# Patient Record
Sex: Female | Born: 1955 | Race: Black or African American | Hispanic: No | Marital: Married | Smoking: Never smoker
Health system: Southern US, Community
[De-identification: ages and names within clinical notes are randomized; demographics above are authoritative.]

## PROBLEM LIST (undated history)

## (undated) DIAGNOSIS — E785 Hyperlipidemia, unspecified: Secondary | ICD-10-CM

## (undated) DIAGNOSIS — K2970 Gastritis, unspecified, without bleeding: Secondary | ICD-10-CM

## (undated) DIAGNOSIS — G4700 Insomnia, unspecified: Secondary | ICD-10-CM

## (undated) DIAGNOSIS — G4730 Sleep apnea, unspecified: Secondary | ICD-10-CM

## (undated) DIAGNOSIS — IMO0002 Reserved for concepts with insufficient information to code with codable children: Secondary | ICD-10-CM

## (undated) DIAGNOSIS — F0781 Postconcussional syndrome: Secondary | ICD-10-CM

## (undated) DIAGNOSIS — R011 Cardiac murmur, unspecified: Secondary | ICD-10-CM

## (undated) DIAGNOSIS — M259 Joint disorder, unspecified: Secondary | ICD-10-CM

## (undated) DIAGNOSIS — M539 Dorsopathy, unspecified: Secondary | ICD-10-CM

## (undated) DIAGNOSIS — R252 Cramp and spasm: Secondary | ICD-10-CM

## (undated) DIAGNOSIS — F32A Depression, unspecified: Secondary | ICD-10-CM

## (undated) DIAGNOSIS — R0602 Shortness of breath: Secondary | ICD-10-CM

## (undated) DIAGNOSIS — R569 Unspecified convulsions: Secondary | ICD-10-CM

## (undated) DIAGNOSIS — I639 Cerebral infarction, unspecified: Secondary | ICD-10-CM

## (undated) DIAGNOSIS — I10 Essential (primary) hypertension: Secondary | ICD-10-CM

## (undated) DIAGNOSIS — M51379 Other intervertebral disc degeneration, lumbosacral region without mention of lumbar back pain or lower extremity pain: Secondary | ICD-10-CM

## (undated) DIAGNOSIS — R109 Unspecified abdominal pain: Secondary | ICD-10-CM

## (undated) DIAGNOSIS — K219 Gastro-esophageal reflux disease without esophagitis: Secondary | ICD-10-CM

## (undated) DIAGNOSIS — Z8719 Personal history of other diseases of the digestive system: Secondary | ICD-10-CM

## (undated) DIAGNOSIS — M5137 Other intervertebral disc degeneration, lumbosacral region: Secondary | ICD-10-CM

## (undated) DIAGNOSIS — N390 Urinary tract infection, site not specified: Secondary | ICD-10-CM

## (undated) HISTORY — DX: Essential (primary) hypertension: I10

## (undated) HISTORY — DX: Unspecified abdominal pain: R10.9

## (undated) HISTORY — DX: Hyperlipidemia, unspecified: E78.5

## (undated) HISTORY — PX: ABDOMINAL HYSTERECTOMY: SHX81

## (undated) HISTORY — PX: BACK SURGERY: SHX140

## (undated) HISTORY — DX: Insomnia, unspecified: G47.00

## (undated) HISTORY — DX: Dorsopathy, unspecified: M53.9

## (undated) HISTORY — DX: Postconcussional syndrome: F07.81

## (undated) HISTORY — DX: Reserved for concepts with insufficient information to code with codable children: IMO0002

## (undated) HISTORY — DX: Cerebral infarction, unspecified: I63.9

## (undated) HISTORY — DX: Cramp and spasm: R25.2

## (undated) HISTORY — DX: Joint disorder, unspecified: M25.9

## (undated) HISTORY — PX: COLONOSCOPY: SHX174

## (undated) HISTORY — DX: Depression, unspecified: F32.A

## (undated) HISTORY — DX: Gastritis, unspecified, without bleeding: K29.70

## (undated) HISTORY — PX: ARTHROGRAM KNEE: SUR67

## (undated) HISTORY — PX: HEMORRHOID SURGERY: SHX153

---

## 1998-03-19 ENCOUNTER — Ambulatory Visit (HOSPITAL_COMMUNITY): Admission: RE | Admit: 1998-03-19 | Discharge: 1998-03-19 | Payer: Self-pay | Admitting: Family Medicine

## 1998-03-19 ENCOUNTER — Encounter: Payer: Self-pay | Admitting: Family Medicine

## 1998-10-16 ENCOUNTER — Encounter: Payer: Self-pay | Admitting: Family Medicine

## 1998-10-16 ENCOUNTER — Ambulatory Visit (HOSPITAL_COMMUNITY): Admission: RE | Admit: 1998-10-16 | Discharge: 1998-10-16 | Payer: Self-pay | Admitting: Family Medicine

## 1998-11-01 ENCOUNTER — Ambulatory Visit (HOSPITAL_COMMUNITY): Admission: RE | Admit: 1998-11-01 | Discharge: 1998-11-01 | Payer: Self-pay | Admitting: Family Medicine

## 1998-11-01 ENCOUNTER — Encounter: Payer: Self-pay | Admitting: Family Medicine

## 1998-11-15 ENCOUNTER — Encounter: Payer: Self-pay | Admitting: Family Medicine

## 1998-11-15 ENCOUNTER — Ambulatory Visit (HOSPITAL_COMMUNITY): Admission: RE | Admit: 1998-11-15 | Discharge: 1998-11-15 | Payer: Self-pay | Admitting: Family Medicine

## 1999-01-04 ENCOUNTER — Encounter: Payer: Self-pay | Admitting: Family Medicine

## 1999-01-04 ENCOUNTER — Ambulatory Visit (HOSPITAL_COMMUNITY): Admission: RE | Admit: 1999-01-04 | Discharge: 1999-01-06 | Payer: Self-pay | Admitting: Family Medicine

## 1999-05-04 ENCOUNTER — Encounter: Payer: Self-pay | Admitting: Obstetrics & Gynecology

## 1999-05-08 ENCOUNTER — Inpatient Hospital Stay (HOSPITAL_COMMUNITY): Admission: RE | Admit: 1999-05-08 | Discharge: 1999-05-10 | Payer: Self-pay | Admitting: Obstetrics & Gynecology

## 1999-05-08 ENCOUNTER — Encounter (INDEPENDENT_AMBULATORY_CARE_PROVIDER_SITE_OTHER): Payer: Self-pay | Admitting: Specialist

## 1999-06-19 ENCOUNTER — Emergency Department (HOSPITAL_COMMUNITY): Admission: EM | Admit: 1999-06-19 | Discharge: 1999-06-19 | Payer: Self-pay

## 1999-06-27 ENCOUNTER — Encounter: Admission: RE | Admit: 1999-06-27 | Discharge: 1999-06-27 | Payer: Self-pay | Admitting: Gastroenterology

## 1999-06-27 ENCOUNTER — Encounter: Payer: Self-pay | Admitting: Gastroenterology

## 1999-07-12 ENCOUNTER — Ambulatory Visit (HOSPITAL_COMMUNITY): Admission: RE | Admit: 1999-07-12 | Discharge: 1999-07-12 | Payer: Self-pay | Admitting: Gastroenterology

## 1999-12-08 ENCOUNTER — Ambulatory Visit (HOSPITAL_COMMUNITY): Admission: RE | Admit: 1999-12-08 | Discharge: 1999-12-08 | Payer: Self-pay | Admitting: Obstetrics and Gynecology

## 2002-12-24 ENCOUNTER — Ambulatory Visit (HOSPITAL_BASED_OUTPATIENT_CLINIC_OR_DEPARTMENT_OTHER): Admission: RE | Admit: 2002-12-24 | Discharge: 2002-12-24 | Payer: Self-pay | Admitting: Neurology

## 2003-01-21 ENCOUNTER — Encounter: Admission: RE | Admit: 2003-01-21 | Discharge: 2003-01-21 | Payer: Self-pay | Admitting: Family Medicine

## 2003-10-06 ENCOUNTER — Encounter: Admission: RE | Admit: 2003-10-06 | Discharge: 2003-10-06 | Payer: Self-pay | Admitting: Gastroenterology

## 2003-11-23 ENCOUNTER — Encounter: Admission: RE | Admit: 2003-11-23 | Discharge: 2003-11-23 | Payer: Self-pay | Admitting: Neurology

## 2003-12-09 ENCOUNTER — Ambulatory Visit (HOSPITAL_COMMUNITY): Admission: RE | Admit: 2003-12-09 | Discharge: 2003-12-09 | Payer: Self-pay | Admitting: Neurology

## 2004-04-05 ENCOUNTER — Ambulatory Visit (HOSPITAL_COMMUNITY): Admission: RE | Admit: 2004-04-05 | Discharge: 2004-04-05 | Payer: Self-pay | Admitting: Neurology

## 2005-02-07 ENCOUNTER — Encounter: Admission: RE | Admit: 2005-02-07 | Discharge: 2005-02-07 | Payer: Self-pay | Admitting: Family Medicine

## 2005-03-22 ENCOUNTER — Encounter: Admission: RE | Admit: 2005-03-22 | Discharge: 2005-03-22 | Payer: Self-pay | Admitting: Family Medicine

## 2006-03-06 ENCOUNTER — Encounter: Admission: RE | Admit: 2006-03-06 | Discharge: 2006-03-06 | Payer: Self-pay | Admitting: Cardiology

## 2006-04-25 IMAGING — CT CT PARANASAL SINUSES LIMITED
1 series · 16 of 28 positions shown, 20 images · non-contrast
Comparison: none

CLINICAL DATA: Headaches and sinusitis.  
 LIMITED CT OF PARANASAL SINUSES:
TECHNIQUE: Limited coronal CT images were obtained through the paranasal sinuses without intravenous contrast.

[Series 2: — · axial · 0.33mm/px · z∈[+9,+110]mm · 16 of 28 slices shown, 20 images]
[im 2/28  brain]
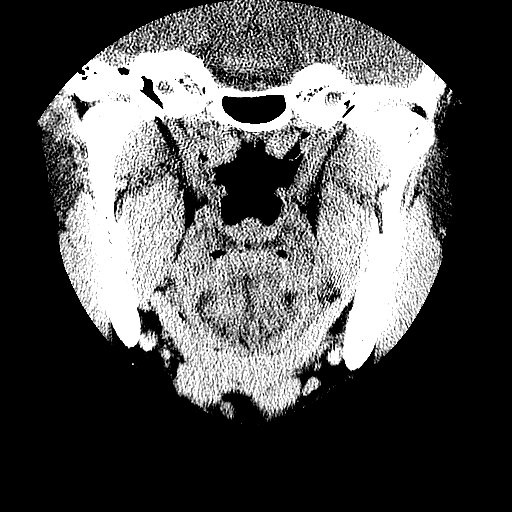
[im 2/28  bone]
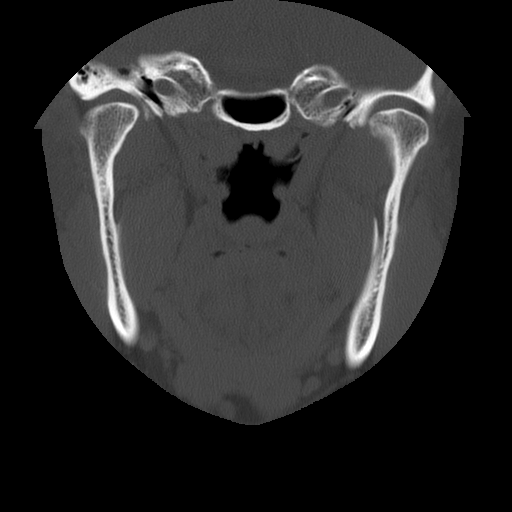
[im 4/28  bone]
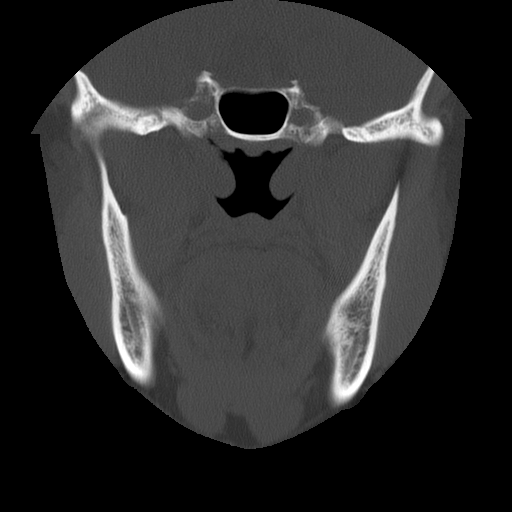
[im 6/28  bone]
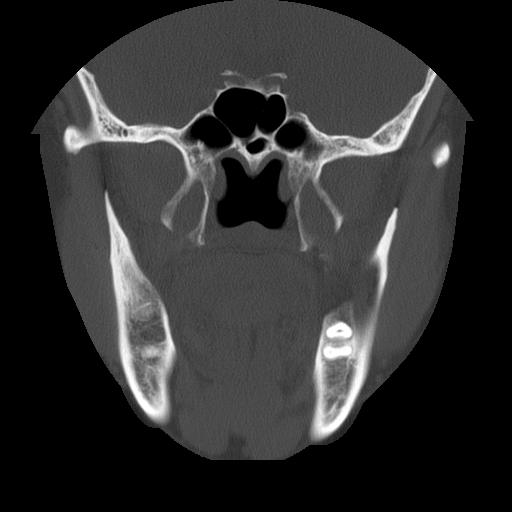
[im 7/28  bone]
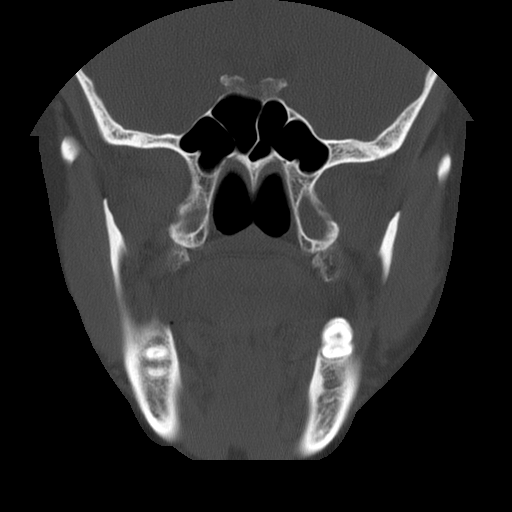
[im 9/28  brain]
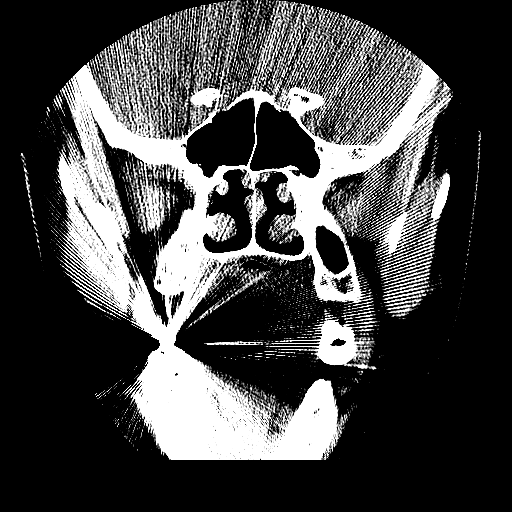
[im 9/28  bone]
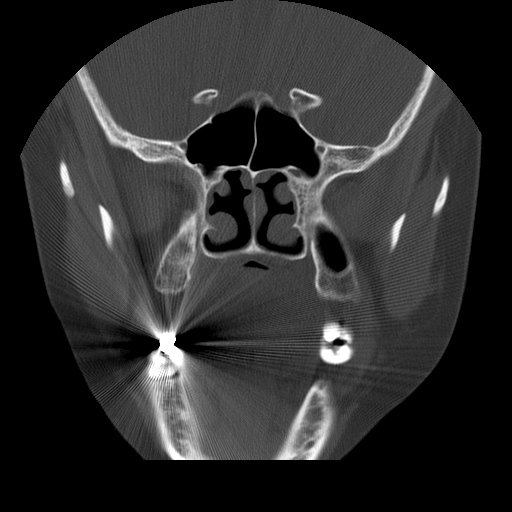
[im 10/28  bone]
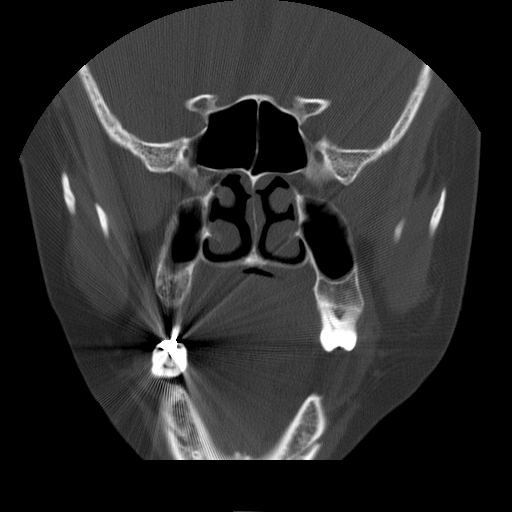
[im 12/28  bone]
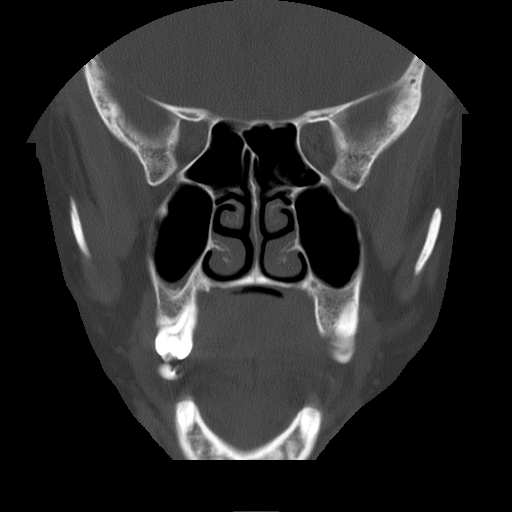
[im 14/28  bone]
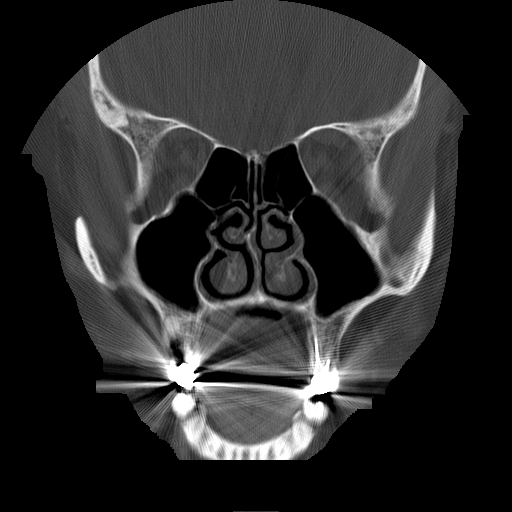
[im 15/28  brain]
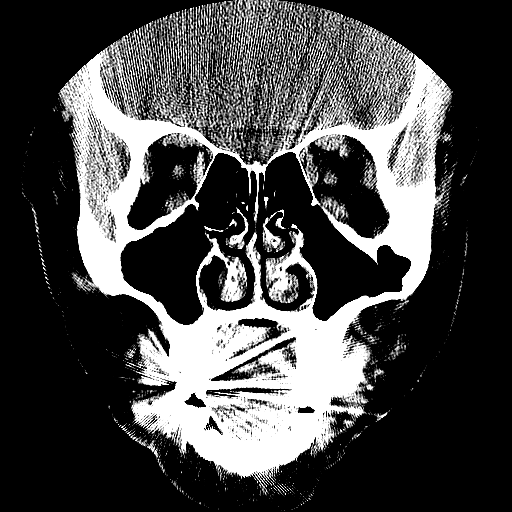
[im 15/28  bone]
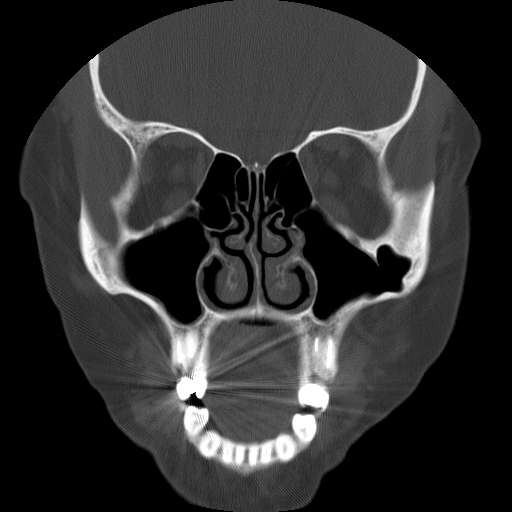
[im 17/28  bone]
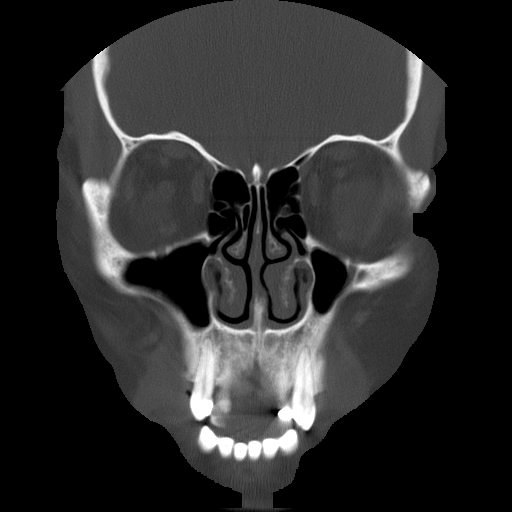
[im 19/28  bone]
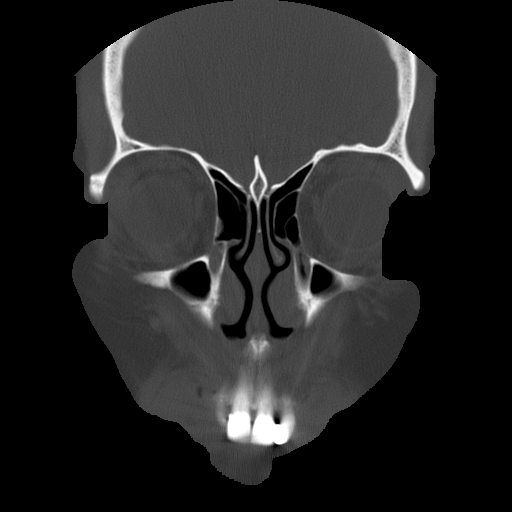
[im 20/28  bone]
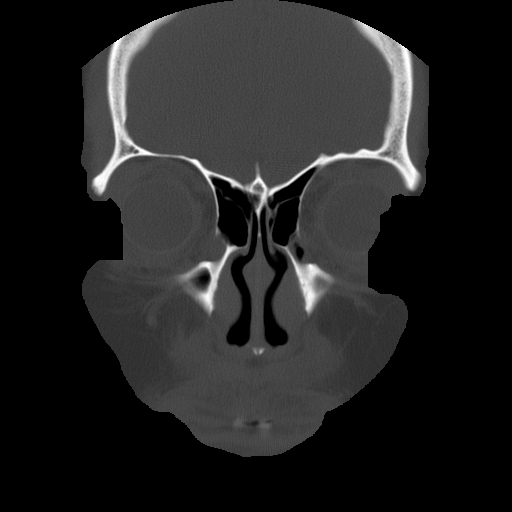
[im 22/28  brain]
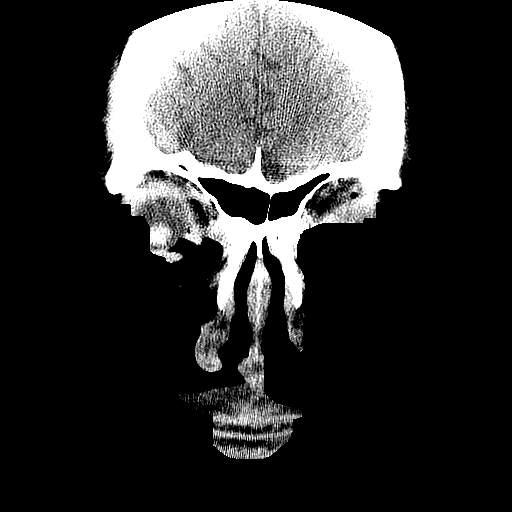
[im 22/28  bone]
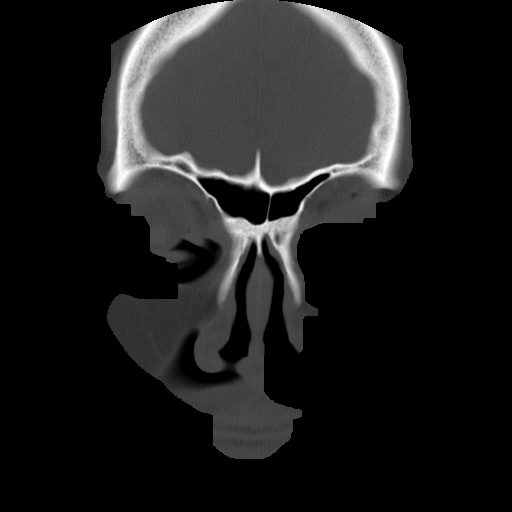
[im 23/28  bone]
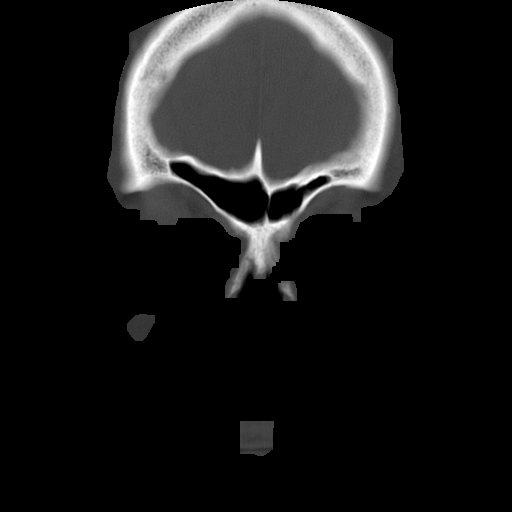
[im 25/28  bone]
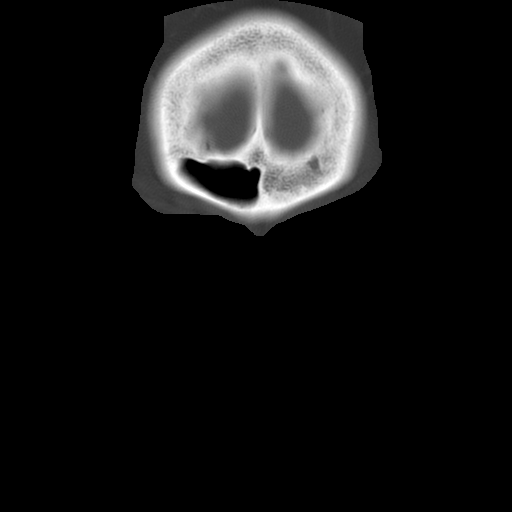
[im 27/28  bone]
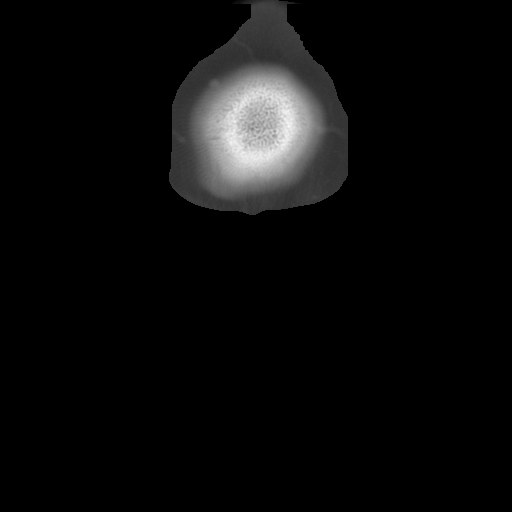

[16 of 28 positions shown; findings below may reference images not displayed]

FINDINGS: Mild mucosal thickening is noted in the left maxillary sinus.  The remaining paranasal sinuses are clear.  
 Temporomandibular joints are located.
IMPRESSION: Mild mucosal thickening in the left maxillary sinus consistent with chronic sinusitis.

## 2006-10-23 ENCOUNTER — Encounter: Admission: RE | Admit: 2006-10-23 | Discharge: 2006-10-23 | Payer: Self-pay | Admitting: Family Medicine

## 2007-04-19 ENCOUNTER — Emergency Department (HOSPITAL_COMMUNITY): Admission: EM | Admit: 2007-04-19 | Discharge: 2007-04-20 | Payer: Self-pay | Admitting: Emergency Medicine

## 2007-11-20 ENCOUNTER — Encounter: Admission: RE | Admit: 2007-11-20 | Discharge: 2007-11-20 | Payer: Self-pay | Admitting: Family Medicine

## 2008-12-21 ENCOUNTER — Encounter: Admission: RE | Admit: 2008-12-21 | Discharge: 2008-12-21 | Payer: Self-pay | Admitting: Family Medicine

## 2009-03-30 ENCOUNTER — Encounter: Admission: RE | Admit: 2009-03-30 | Discharge: 2009-03-30 | Payer: Self-pay | Admitting: Orthopedic Surgery

## 2009-07-29 ENCOUNTER — Encounter (INDEPENDENT_AMBULATORY_CARE_PROVIDER_SITE_OTHER): Payer: Self-pay | Admitting: Internal Medicine

## 2009-07-29 ENCOUNTER — Ambulatory Visit: Payer: Self-pay | Admitting: Cardiovascular Disease

## 2009-07-29 ENCOUNTER — Ambulatory Visit: Payer: Self-pay | Admitting: Vascular Surgery

## 2009-07-29 ENCOUNTER — Observation Stay (HOSPITAL_COMMUNITY): Admission: EM | Admit: 2009-07-29 | Discharge: 2009-07-30 | Payer: Self-pay | Admitting: Emergency Medicine

## 2010-01-21 ENCOUNTER — Encounter: Payer: Self-pay | Admitting: Family Medicine

## 2010-01-22 ENCOUNTER — Encounter: Payer: Self-pay | Admitting: Cardiology

## 2010-01-22 ENCOUNTER — Encounter: Payer: Self-pay | Admitting: Neurology

## 2010-02-24 ENCOUNTER — Other Ambulatory Visit: Payer: Self-pay | Admitting: Gastroenterology

## 2010-02-27 ENCOUNTER — Ambulatory Visit
Admission: RE | Admit: 2010-02-27 | Discharge: 2010-02-27 | Disposition: A | Discharge status: Home / Self Care | Payer: Medicare Other | Source: Ambulatory Visit | Attending: Gastroenterology | Admitting: Gastroenterology

## 2010-02-28 ENCOUNTER — Other Ambulatory Visit (HOSPITAL_COMMUNITY): Payer: Self-pay | Admitting: Gastroenterology

## 2010-02-28 DIAGNOSIS — R1013 Epigastric pain: Secondary | ICD-10-CM

## 2010-03-10 ENCOUNTER — Ambulatory Visit (HOSPITAL_COMMUNITY)
Admission: RE | Admit: 2010-03-10 | Discharge: 2010-03-10 | Disposition: A | Discharge status: Home / Self Care | Payer: Medicare Other | Source: Ambulatory Visit | Attending: Gastroenterology | Admitting: Gastroenterology

## 2010-03-10 DIAGNOSIS — R1013 Epigastric pain: Secondary | ICD-10-CM | POA: Insufficient documentation

## 2010-03-10 MED ORDER — TECHNETIUM TC 99M MEBROFENIN IV KIT
5.0000 | PACK | Freq: Once | INTRAVENOUS | Status: AC | PRN
  Administered 2010-03-10: 5 via INTRAVENOUS

## 2010-03-18 LAB — COMPREHENSIVE METABOLIC PANEL
AST: 19 U/L (ref 0–37)
Albumin: 3.7 g/dL (ref 3.5–5.2)
Alkaline Phosphatase: 64 U/L (ref 39–117)
BUN: 13 mg/dL (ref 6–23)
CO2: 27 mEq/L (ref 19–32)
Chloride: 107 mEq/L (ref 96–112)
Potassium: 4.4 mEq/L (ref 3.5–5.1)
Total Bilirubin: 0.7 mg/dL (ref 0.3–1.2)

## 2010-03-18 LAB — CK TOTAL AND CKMB (NOT AT ARMC)
CK, MB: 1.6 ng/mL (ref 0.3–4.0)
Relative Index: 1.3 (ref 0.0–2.5)
Total CK: 125 U/L (ref 7–177)

## 2010-03-18 LAB — CBC
HCT: 40 % (ref 36.0–46.0)
Hemoglobin: 12.4 g/dL (ref 12.0–15.0)
Hemoglobin: 13.6 g/dL (ref 12.0–15.0)
MCH: 32 pg (ref 26.0–34.0)
MCH: 32 pg (ref 26.0–34.0)
MCHC: 34.1 g/dL (ref 30.0–36.0)
MCV: 93.6 fL (ref 78.0–100.0)
MCV: 93.9 fL (ref 78.0–100.0)
Platelets: 191 10*3/uL (ref 150–400)
RBC: 3.89 MIL/uL (ref 3.87–5.11)
RBC: 4.26 MIL/uL (ref 3.87–5.11)
RDW: 14.6 % (ref 11.5–15.5)
WBC: 3.7 10*3/uL — ABNORMAL LOW (ref 4.0–10.5)

## 2010-03-18 LAB — URINALYSIS, ROUTINE W REFLEX MICROSCOPIC
Bilirubin Urine: NEGATIVE
Hgb urine dipstick: NEGATIVE
Specific Gravity, Urine: 1.009 (ref 1.005–1.030)
pH: 7.5 (ref 5.0–8.0)

## 2010-03-18 LAB — BASIC METABOLIC PANEL
CO2: 29 mEq/L (ref 19–32)
Chloride: 103 mEq/L (ref 96–112)
GFR calc Af Amer: >60 mL/min (ref >60)
Sodium: 139 mEq/L (ref 135–145)

## 2010-03-18 LAB — DIFFERENTIAL
Basophils Absolute: 0 10*3/uL (ref 0.0–0.1)
Basophils Relative: 1 % (ref 0–1)
Eosinophils Relative: 3 % (ref 0–5)
Monocytes Absolute: 0.4 10*3/uL (ref 0.1–1.0)
Neutro Abs: 1.5 10*3/uL — ABNORMAL LOW (ref 1.7–7.7)

## 2010-03-18 LAB — GLUCOSE, CAPILLARY: Glucose-Capillary: 101 mg/dL — ABNORMAL HIGH (ref 70–99)

## 2010-04-05 ENCOUNTER — Other Ambulatory Visit (HOSPITAL_COMMUNITY): Payer: Self-pay | Admitting: General Surgery

## 2010-04-05 DIAGNOSIS — R1013 Epigastric pain: Secondary | ICD-10-CM

## 2010-04-13 ENCOUNTER — Encounter (HOSPITAL_COMMUNITY)
Admission: RE | Admit: 2010-04-13 | Discharge: 2010-04-13 | Disposition: A | Discharge status: Home / Self Care | Payer: Medicare Other | Source: Ambulatory Visit | Attending: General Surgery | Admitting: General Surgery

## 2010-04-13 DIAGNOSIS — R110 Nausea: Secondary | ICD-10-CM | POA: Insufficient documentation

## 2010-04-13 DIAGNOSIS — R142 Eructation: Secondary | ICD-10-CM | POA: Insufficient documentation

## 2010-04-13 DIAGNOSIS — R1013 Epigastric pain: Secondary | ICD-10-CM

## 2010-04-13 DIAGNOSIS — R143 Flatulence: Secondary | ICD-10-CM | POA: Insufficient documentation

## 2010-04-13 DIAGNOSIS — R109 Unspecified abdominal pain: Secondary | ICD-10-CM | POA: Insufficient documentation

## 2010-04-13 DIAGNOSIS — R141 Gas pain: Secondary | ICD-10-CM | POA: Insufficient documentation

## 2010-04-13 MED ORDER — TECHNETIUM TC 99M SULFUR COLLOID
2.0000 | Freq: Once | INTRAVENOUS | Status: AC | PRN
  Administered 2010-04-13: 2 via INTRAVENOUS

## 2010-04-25 ENCOUNTER — Other Ambulatory Visit: Payer: Self-pay | Admitting: General Surgery

## 2010-04-25 DIAGNOSIS — R1013 Epigastric pain: Secondary | ICD-10-CM

## 2010-04-25 DIAGNOSIS — R140 Abdominal distension (gaseous): Secondary | ICD-10-CM

## 2010-04-28 ENCOUNTER — Ambulatory Visit
Admission: RE | Admit: 2010-04-28 | Discharge: 2010-04-28 | Disposition: A | Discharge status: Home / Self Care | Payer: Medicare Other | Source: Ambulatory Visit | Attending: General Surgery | Admitting: General Surgery

## 2010-04-28 DIAGNOSIS — R1013 Epigastric pain: Secondary | ICD-10-CM

## 2010-04-28 DIAGNOSIS — R140 Abdominal distension (gaseous): Secondary | ICD-10-CM

## 2010-04-28 MED ORDER — IOHEXOL 300 MG/ML  SOLN
125.0000 mL | Freq: Once | INTRAMUSCULAR | Status: AC | PRN
  Administered 2010-04-28: 125 mL via INTRAVENOUS

## 2010-05-08 ENCOUNTER — Encounter (INDEPENDENT_AMBULATORY_CARE_PROVIDER_SITE_OTHER): Payer: Self-pay | Admitting: General Surgery

## 2010-05-19 NOTE — Op Note (Signed)
[facility]  Patient:    Savannah Taylor, Savannah Taylor                  MRN: [id number] Adm. Date:  [id number] Attending:  Genia Del                           Operative Report  DATE OF BIRTH:  26-Dec-1955.  PREOPERATIVE DIAGNOSIS:  Menorrhagia with uterine myomas.  POSTOPERATIVE DIAGNOSIS:  Menorrhagia with uterine myomas.  INTERVENTION:  Laparoscopically assisted vaginal hysterectomy.  SURGEON:  Genia Del, M.D.  ASSISTANT:  Pershing Cox, M.D.  ANESTHESIOLOGIST:  Lucille Passy, M.D.  DESCRIPTION OF PROCEDURE:  Under general anesthesia with endotracheal intubation, the patient is in lithotomy position.  She is prepped with Betadine on the abdominal, suprapubic, vulvar and vaginal areas.  The bladder catheter is introduced and the patient is then draped as usual.  The vaginal exam reveals a mobile uterus that corresponds to about 8-weeks gestation.  It is mobile.  No adnexal mass.  The speculum is introduced and the uterus is cannulated with the single-tooth cannula.  Abdominally, an infraumbilical incision is done over 10 mm with a scalpel.  The Veress needle is introduced. The security tests are done and a pneumoperitoneum is created with 3 L of CO2. Once finished, the Veress needle is removed.  The trocar with the laparoscope is inserted.  The abdominal and pelvic cavities are observed; no gross pathology in the abdominal cavity.  The pelvic cavity reveals two normal ovaries in appearance and size.  The tubes are post tubal sterilization and the uterus is increased in volume with multiple myomas; the biggest is fundal, measuring probably around 3.0 to 3.5 cm.  Two contralateral incisions are made, one at the right and the other one on the left iliac fossae, over 5 mm with a scalpel.  The 5-mm trocars are introduced and the instruments are placed in.  We use the tripolar to clamp, cauterize and cut successively, starting with the  round ligament on the left side, then the uteroovarian and going down along the broad ligament on the left uterine aspect; we proceed exactly the same on the right side.  We then verify the location of the ureters on each side, which are seen in normal anatomic position and away from the surgical field.  The anterior peritoneum is then opened and the bladder is retracted downwards.  Some adhesions are present at that level because of the previous three C-sections.  We then confirm that hemostasis is adequate and remove the instruments but the leave the trocars and continue vaginally.  The patients legs are repositioned and the valve is introduced.  The cervix is grasped with two tenacula.  The cervix is infiltrated all around with saline and then a scalpel is used to do a circular incision at the junction between the cervix and the vagina.  We then open the anterior peritoneum and using a retractor, move the bladder away from the surgical site.  We then successively clamp, cut and suture the cardinal ligaments, then the uterosacral ligaments and then the uterine arteries on each side.  We then complete the hysterectomy by clamping some of the broad ligaments peritoneum that was left on each side; we clamp, cut and suture.  Hemostasis is completed on the right side with two sutures close to the angle of the posterior vaginal mucosa edge.  We then do two McCall sutures with Vicryl  0 and reapproximate the two uterosacral ligaments in the midline.  We do a suspensory suture on each side, including the anterior vaginal wall, the peritoneum anteriorly, the uterosacral ligaments and the peritoneum posteriorly, finishing with the posterior vaginal wall.  We then do a circular baseball stitch that is a running-______ suture with Vicryl 0 all around the edge of the vaginal mucosa.  We then closed the vagina via a running-locked suture with Vicryl 0 and we finish by attaching the suspensory sutures on  each side.  We then packed the vagina with an iodine packing, one inch; hemostasis was adequate.  We then notice a small vulvar tear on the left side, which is closed with Vicryl 4-0 and then infiltrated with Marcaine.  We go back to the abdominal tying to verify hemostasis. Pneumoperitoneum is re-created and the camera as well as the Nezhat are introduced with the probe.  Hemostasis is completed at the angle of the vagina on the left side with the tripolar.  We then irrigate and suction the pelvic cavity and hemostasis is good.  We remove all the instruments, let the CO2 evacuate and we then close the aponeurosis on the infraumbilical incision with Vicryl 0.  We then close the skin on each incision with Vicryl 4-0.  Marcaine plain 0.25% is injected at the level of each incision.  The estimated blood loss was 1.1 L.  The hematocrit at the end of the surgery was 0.26.  No complication occurred except the small vulvar tear that was repaired. DD:  05/08/99 TD:  05/10/99 Job: [id number]

## 2010-05-19 NOTE — Procedures (Signed)
[place]  Patient:    Savannah Taylor, Savannah Taylor                     MRN: [id number] Proc. Date: 07/12/99 Adm. Date:  [id number] Attending:  Charna Elizabeth CC:         Genia Del, M.D.                           Procedure Report  DATE OF BIRTH:  [birth year]  REFERRING PHYSICIAN:  Genia Del, M.D.  PROCEDURE PERFORMED:  Colonoscopy.  ENDOSCOPIST:  Anselmo Rod, M.D.  INSTRUMENT USED:  Olympus video colonoscope.  INDICATIONS FOR PROCEDURE:  Abdominal pain with trace guaiac positive stools in a 55 year old black female, rule out colonic masses, polyps, hemorrhoids, etc.  The patient is status post hysterectomy in the recent past.  PREPROCEDURE PREPARATION:  Informed consent was procured from the patient. The patient was fasted for eight hours prior to the procedure and prepped with a bottle of magnesium citrate and a gallon of NuLytely the night prior to the procedure.  PREPROCEDURE PHYSICAL:  The patient had stable vital signs.  Neck supple. Chest clear to auscultation.  S1, S2 regular.  Abdomen soft with normal abdominal bowel sounds.  DESCRIPTION OF PROCEDURE:  The patient was placed in the left lateral decubitus position and sedated with an additional 25 mcg of fentanyl and 2.5 mg of Versed intravenously.  Once the patient was adequately sedated and maintained on low-flow oxygen and continuous cardiac monitoring, the Olympus video colonoscope was advanced from the rectum to the cecum without difficulty.  Except for small internal hemorrhoids no other abnormalities were seen.  The patient tolerated the procedure well without complication.  The procedure was complete up to the cecum.  The ileocecal valve and the appendicular orifice were clearly visualized.  IMPRESSION:  Normal colonoscopy except for small nonbleeding internal hemorrhoids.  RECOMMENDATIONS: 1. The patient has been advised to increase the fluid and fiber in her  diet. 2. She is advised to follow up with Dr. Seymour Bars for further evaluation of her    discomfort after hysterectomy.  She is to see me on a p.r.n. basis for GI    complaints.DD:  07/12/99 TD:  07/12/99 Job: [id number]

## 2010-05-19 NOTE — Procedures (Signed)
[place]  Patient:    Savannah Taylor, Savannah Taylor                     MRN: [id number] Proc. Date: 07/12/99 Adm. Date:  [id number] Attending:  Charna Elizabeth CC:         Genia Del, M.D.                           Procedure Report  DATE OF BIRTH:  26-Sep-1955  REFERRING PHYSICIAN:  Genia Del, M.D.  PROCEDURE PERFORMED:  Esophagogastroduodenoscopy.  ENDOSCOPIST:  Anselmo Rod, M.D.  INSTRUMENT USED:  Olympus video panendoscope.  INDICATIONS FOR PROCEDURE:  Epigastric pain and periumbilical discomfort with trace guaiac positive stool in a 55 year old black female who has undergone hysterectomy in the recent past.  Rule out peptic ulcer disease, esophagitis, gastritis, etc.  PREPROCEDURE PREPARATION:  Informed consent was procured from the patient. The patient was fasted for eight hours prior to the procedure.  PREPROCEDURE PHYSICAL:  The patient had stable vital signs.  Neck supple. Chest clear to auscultation.  S1, S2 regular.  Abdomen soft with normal abdominal bowel sounds.  DESCRIPTION OF PROCEDURE:  The patient was placed in left lateral decubitus position and sedated with 75 mcg of fentanyl and 7.5 mg of Versed intravenously.  Once the patient was adequately sedated and maintained on low-flow oxygen and continuous cardiac monitoring, the Olympus video panendoscope was advanced through the mouthpiece, over the tongue, into the esophagus under direct vision.  The entire esophagus appeared normal without evidence of ring, stricture, masses lesions or esophagitis.  The scope was then advanced into the stomach.  There was a small hiatal hernia seen on high retroflexion in the cardia.  No other abnormalities were present on the gastric mucosa except for mild antral gastritis.  The duodenal bulb and the small bowel distal to the bulb appeared normal.  There was no outlet obstruction.  The patient tolerated the procedure well without  complications.  IMPRESSION: 1. Small hiatal hernia. 2. Mild antral gastritis.  RECOMMENDATION: 1. Proceed with colonoscopy at this time. 2. Avoid nonsteroidals for now. 3. Follow antireflux measures. DD:  07/12/99 TD:  07/12/99 Job: [id number]

## 2010-05-19 NOTE — Discharge Summary (Signed)
[facility]  Patient:    Savannah Taylor, Savannah Taylor                  MRN: [id number] Adm. Date:  [id number] Disc. Date: [id number] Attending:  Genia Del                           Discharge Summary  ADMISSION DIAGNOSIS:  Symptomatic uterine myomas with menorrhagia.  PREOPERATIVE DIAGNOSIS:  Symptomatic uterine myomas with menorrhagia.  POSTOPERATIVE DIAGNOSIS:  Symptomatic uterine myomas with menorrhagia.  SURGERY:  May 08, 1999, laparoscopically assisted vaginal hysterectomy with preservation of adnexa.  POSTOPERATIVE EVALUATION:  Unremarkable.  Cardiovascularly stable.  Afebrile. Hemoglobin postop 8.4, hematocrit 26.  DISPOSITION:  Discharged on postop day #2.  Postop advise is given.  DISCHARGE MEDICATIONS: 1. Tylox p.r.n. prescribed. 2. Iron sulfate t.i.d. recommended.  FOLLOWUP:  Will follow up at [facility] in three weeks.  DISCHARGE DIAGNOSIS:  Symptomatic uterine myomas with menorrhagia.  COMPLICATIONS:  No operative complication except for postop anemia. DD:  05/26/99 TD:  05/29/99 Job: [id number]

## 2010-05-19 NOTE — Procedures (Signed)
HISTORY:  This patient is a 55 year old patient with a history of migraine,  generalized seizure in the last 6 weeks prior to this evaluation. The  patient is being evaluated for possibly epilepsy. This is a  sleep-deprived  EEG recording. No skull defects were noted.   MEDICATIONS:  1.  Topamax.  2.  Stadol.  3.  Nasal spray.  4.  Imitrex.  5.  Aspirin.  6.  Verapamil.   EEG CLASSIFICATION:  Dysrhythmia, grade 2, bifrontal temporal.   DESCRIPTION:  According to the background rhythms, this recording consists  of a fairly well modulated medium amplitude alpha rhythm of 8 to 9 hertz and  is reactive to eye opening and closure. As the record progresses, the  patient is initially in a waking state but at times seems to enter the  drowsy state with a drop out of the background rhythm and activities and  onset of 7 hertz beta frequency slowing seen. The patient never entered  stage 2 sleep at any time during the recording. At times, during the  recording, there appears to be evidence of sharp complexes emanating from  the anterior frontal and temporal regions in a bilateral fashion. This is  also seen during hyperventilation. Photic stimulation does result in a  bilateral and symmetric photic driving response. At no time during the  recording are there overt spike wave discharges seen. EKG monitor showed no  evidence of cardiac rhythm abnormalities  with a heart rate of 72.   IMPRESSION:  This is an abnormal EEG recording due to sharp wave activity  emanating from the anterior frontal temporal regions. Such a recording  suggests lowered seizure threshold although no epileptiform/electrographic  seizures were seen during this recording. The patient never entered sleeping  stage during this sleep deprived recording.      DGL:OVFI  D:  12/09/2003 20:53:11  T:  12/10/2003 13:30:10  Job #:  [id number]

## 2010-05-19 NOTE — H&P (Signed)
[facility]  Patient:    Savannah Taylor, Savannah Taylor                       MRN: [id number] Adm. Date:  12/08/99 Attending:  Janine Limbo, M.D. CC:         Anselmo Rod, M.D.   History and Physical  HISTORY OF PRESENT ILLNESS:     Ms. Mix is a 55 year old para 3-0-2-3, who presents for a diagnostic laparoscopy because of abdominal pain.  The patient reports that the pain has been around her umbilicus and then travels to her lower abdomen on both the right and left side.  The patient had a laparoscopically-assisted vaginal hysterectomy in May 2001 because of menorrhagia and fibroids by another physician.  She reports that her pain has become increasingly severe since the time of her surgery.  It is worse now than prior to her surgery.  She denies fever and chills.  The patient was evaluated by her family physician, and she was found to have a normal upper GI study, normal colonoscopy, normal pelvic ultrasound and normal CT scan.  The patient has had a prior tubal ligation and a prior cesarean delivery.  She denies a past history of sexually transmitted infections.  Her last Pap smear was in July 2001, and it was said to be within normal limits.  PAST OBSTETRICAL HISTORY:       The patient has had three full-term deliveries.  She had one vaginal delivery and two cesarean sections.  She has had two miscarriages.  PAST MEDICAL HISTORY:           The patient has a history of asthma, hypertension, blood transfusions, migraine headaches, and intestinal problems. The patient does suffer from depression and anxiety.  CURRENT MEDICATIONS:            Cardizem, Celexa, Wellbutrin, and Xanax.  DRUG ALLERGIES:                 Naprosyn.  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  She denies cigarette use, alcohol use and recreational drug use.  REVIEW OF SYSTEMS:              The  patient wears glasses.  She exercises occasionally.  She occasionally does her own self breast exams.  Her diet is regular.  FAMILY HISTORY:                 The patients father had lung cancer.  PHYSICAL EXAMINATION:  VITAL SIGNS:                    Weight 203 pounds, height 5 feet 5 inches.  HEENT:                          Within normal limits.  CHEST:                          Clear.  HEART:                          Regular rate and rhythm.  BREASTS:  Without masses.  ABDOMEN:                        Nontender.  EXTREMITIES:                    Within normal limits.  NEUROLOGICAL:                   Exam is normal.  PELVIC:                         External genitalia is normal.  The vagina is normal.  The cervix is absent.  The uterus is absent.  Adnexa:  No masses appreciated.  Rectovaginal exam confirms.  ASSESSMENT:                     Worsening abdominal pain.  PLAN:                           The patient will undergo an open diagnostic laparoscopy.  She understands the indications for her procedure, and she accepts the risks of, but not limited to, anesthetic complications, bleeding, infections, and possible damage to the surrounding organs.  She understands that no guarantees can be given concerning the total relief of her discomfort. DD:  12/05/99 TD:  12/05/99 Job: [id number]

## 2010-05-19 NOTE — H&P (Signed)
[facility]  Patient:    Savannah Taylor, Savannah Taylor                       MRN: [id number] Adm. Date:  05/08/99 Attending:  Genia Del, M.D.                         History and Physical  DATE OF BIRTH: [birth year]  REASON FOR ADMISSION: Menorrhagia resistant to medical treatment secondary to uterine myoma.  HISTORY OF PRESENT ILLNESS:  Savannah Taylor is a 55 year old black female, G5, P3, A2. The patient was referred to [place] on October 18, 1998. She was evaluated at that time by our nurse practitioner, Savannah Taylor. She was complaining of constant pelvic discomfort and menses every 19 to 28 days x 7 days with heavy flow. The menorrhagia were progressing x 6 to 8 months. It was also accompanied by dysmenorrhea and not relieved by ibuprofen. On exam, the uterus was enlarged, slightly tender and irregular. She had had an ultrasound which I dont find fibroids. She was then referred to me on October 24. At that time, the symptoms  were the same. The ultrasound in October 2000 showed a uterus of 9.1 x 6.8 x 7 m. The largest fibroid was 4.5 cm, the two ovaries were normal. Hemoglobin was 12.1 in September and then was 11.7 in October. A discussion was done on surgical versus medical approach and the decision was made to attempt a Depot Lupron at a maximum of six months and to repeat the ultrasound after the third dose. The patients myomas responded quite well to the Depro-Lupron. The patient had menopausal symptoms that were tolerable, so she had five doses of Depot Lupron. The ultrasound in March 2001 showed a uterus that was now 8.6 x 6.4 x 6.6 cm and the largest fibroid had a diameter of 3.3 cm. A decision was made at that time to proceed with LAVH and to preserve the ovaries.  PAST MEDICAL HISTORY: Positive for hypertension.  PAST SURGICAL HISTORY: Bilateral tubal ligation, cesarean section x 3 and endarterectomy in 1999. She is currently taking atenolol  for hypertension.  ALLERGIES:  No known drug allergies.  SOCIAL HISTORY: Nonsmoker and married.  REVIEW OF SYSTEMS:  Constitutinal negative. HEENT within normal limits. Cardiovascular negative. Respiratory negative. Gastrointestinal negative. Urologic negative. Neurologic negative. Dermatologic negative.  PHYSICAL EXAMINATION:  GENERAL: No apparent distress.  VITAL SIGNS: Blood pressure 130/90, pulse regular, respiratory rate 20, temperature normal.  HEENT: Within normal limits.  HEART: S1, S2 normal. No S3, S4, no murmur.  LUNGS:  Clear bilaterally, good air entry.  ABDOMEN:  Soft, nontender, no hepatosplenomegaly, no mass.  VAGINAL:  The uterus is anteverted, slightly irregular corresponding to about [redacted]  weeks gestation, mobile. No adnexal mass. The cervix is normal. The vagina and vulva are normal.  EXTREMITIES: Lower limbs normal, no edema, good pulses.  IMPRESSION: A 55 year old, G5, P3, A2 with menorrhagia secondary to symptomatic  multiple uterine myomas with positive response to Depot Lupron x 5 doses.  PLAN:  LAVH with preservation of ovaries. Surgery plus risks discussed with the  patient. Prep done. The surgery will be done on May 08, 1999. My assistant will be Dr. Cherly Hensen. DD:  05/08/99 TD:  05/08/99 Job: [id number]

## 2010-06-14 ENCOUNTER — Other Ambulatory Visit (HOSPITAL_COMMUNITY): Payer: Self-pay | Admitting: General Surgery

## 2010-06-14 DIAGNOSIS — K449 Diaphragmatic hernia without obstruction or gangrene: Secondary | ICD-10-CM

## 2010-06-19 ENCOUNTER — Ambulatory Visit (HOSPITAL_COMMUNITY)
Admission: RE | Admit: 2010-06-19 | Discharge: 2010-06-19 | Disposition: A | Discharge status: Home / Self Care | Payer: Medicare Other | Source: Ambulatory Visit | Attending: General Surgery | Admitting: General Surgery

## 2010-06-19 DIAGNOSIS — K449 Diaphragmatic hernia without obstruction or gangrene: Secondary | ICD-10-CM | POA: Insufficient documentation

## 2010-06-19 DIAGNOSIS — K458 Other specified abdominal hernia without obstruction or gangrene: Secondary | ICD-10-CM | POA: Insufficient documentation

## 2010-06-19 DIAGNOSIS — R1013 Epigastric pain: Secondary | ICD-10-CM | POA: Insufficient documentation

## 2010-06-19 DIAGNOSIS — R110 Nausea: Secondary | ICD-10-CM | POA: Insufficient documentation

## 2010-07-03 ENCOUNTER — Ambulatory Visit (HOSPITAL_COMMUNITY)
Admission: RE | Admit: 2010-07-03 | Discharge: 2010-07-03 | Disposition: A | Discharge status: Home / Self Care | Payer: Medicare Other | Source: Ambulatory Visit | Attending: Gastroenterology | Admitting: Gastroenterology

## 2010-07-03 DIAGNOSIS — Z5320 Procedure and treatment not carried out because of patient's decision for unspecified reasons: Secondary | ICD-10-CM | POA: Insufficient documentation

## 2010-07-03 DIAGNOSIS — E785 Hyperlipidemia, unspecified: Secondary | ICD-10-CM | POA: Insufficient documentation

## 2010-07-03 DIAGNOSIS — R109 Unspecified abdominal pain: Secondary | ICD-10-CM | POA: Insufficient documentation

## 2010-07-03 DIAGNOSIS — I10 Essential (primary) hypertension: Secondary | ICD-10-CM | POA: Insufficient documentation

## 2010-07-03 DIAGNOSIS — K219 Gastro-esophageal reflux disease without esophagitis: Secondary | ICD-10-CM | POA: Insufficient documentation

## 2011-07-02 ENCOUNTER — Encounter: Payer: Medicare Other | Attending: Physical Medicine & Rehabilitation

## 2011-07-02 ENCOUNTER — Ambulatory Visit: Payer: Medicare Other | Admitting: Physical Medicine & Rehabilitation

## 2011-08-02 ENCOUNTER — Other Ambulatory Visit: Payer: Self-pay

## 2011-08-02 DIAGNOSIS — R0600 Dyspnea, unspecified: Secondary | ICD-10-CM

## 2011-08-03 ENCOUNTER — Ambulatory Visit (HOSPITAL_COMMUNITY)
Admission: RE | Admit: 2011-08-03 | Discharge: 2011-08-03 | Disposition: A | Discharge status: Home / Self Care | Payer: Medicare Other | Source: Ambulatory Visit | Attending: Family Medicine | Admitting: Family Medicine

## 2011-08-03 DIAGNOSIS — R0609 Other forms of dyspnea: Secondary | ICD-10-CM | POA: Insufficient documentation

## 2011-08-03 DIAGNOSIS — R0989 Other specified symptoms and signs involving the circulatory and respiratory systems: Secondary | ICD-10-CM | POA: Insufficient documentation

## 2011-08-03 MED ORDER — ALBUTEROL SULFATE (5 MG/ML) 0.5% IN NEBU
2.5000 mg | INHALATION_SOLUTION | Freq: Once | RESPIRATORY_TRACT | Status: AC
  Administered 2011-08-03: 2.5 mg via RESPIRATORY_TRACT

## 2011-09-04 ENCOUNTER — Ambulatory Visit (INDEPENDENT_AMBULATORY_CARE_PROVIDER_SITE_OTHER): Payer: 59 | Admitting: Internal Medicine

## 2011-09-04 ENCOUNTER — Encounter: Payer: Self-pay | Admitting: Internal Medicine

## 2011-09-04 VITALS — BP 140/92 | HR 74 | Temp 98.6°F | Ht 65.0 in | Wt 207.0 lb

## 2011-09-04 DIAGNOSIS — R0609 Other forms of dyspnea: Secondary | ICD-10-CM

## 2011-09-04 DIAGNOSIS — I10 Essential (primary) hypertension: Secondary | ICD-10-CM

## 2011-09-04 DIAGNOSIS — R0600 Dyspnea, unspecified: Secondary | ICD-10-CM | POA: Insufficient documentation

## 2011-09-04 MED ORDER — NEBIVOLOL HCL 20 MG PO TABS: ORAL_TABLET | ORAL | Status: DC

## 2011-09-04 NOTE — Assessment & Plan Note (Signed)
Strongly prefer in this setting: Bystolic, the most beta -1  selective Beta blocker available in sample form, with bisoprolol the most selective generic choice  on the market.  

## 2011-09-04 NOTE — Patient Instructions (Addendum)
bystolic 20 mg one daily ok to take twice daily if needed  nexium 40 mg Take 30-60 min before first meal of the day   GERD (REFLUX)  is an extremely common cause of respiratory symptoms, many times with no significant heartburn at all.    It can be treated with medication, but also with lifestyle changes including avoidance of late meals, excessive alcohol, smoking cessation, and avoid fatty foods, chocolate, peppermint, colas, red wine, and acidic juices such as orange juice.  NO MINT OR MENTHOL PRODUCTS SO NO COUGH DROPS  USE SUGARLESS CANDY INSTEAD (jolley ranchers or Stover's)  NO OIL BASED VITAMINS - use powdered substitutes.  Please schedule a follow up office visit in 4 weeks, sooner if needed     .

## 2011-09-04 NOTE — Progress Notes (Signed)
  Subjective:    Patient ID: Savannah Taylor, female    DOB: 22-Nov-1955  MRN: [id number]  HPI  56 yobf ward secretary at [employer] never smoker some sinus problems with hay fever but never sob outgrew at age 56's until around 2008 onset of sob with  wheezing referred 09/04/2011 to Pulmonary clinic by Dr Geradine Girt.  09/04/2011 1st pulmonary eval cc variable sob but always when exerts like one flight of steps and ventolin seemed to helped the most, resting did not always relieve it but never comes on at rest. Also with exposure to paint fumes,   No unusual cough, purulent sputum or sinus/hb symptoms on present rx.  Sleeping ok without nocturnal  or early am exacerbation  of respiratory  c/o's or need for noct saba. Also denies any obvious fluctuation of symptoms with weather or environmental changes or other aggravating or alleviating factors except as outlined above.   Takes nexium x 3-4 months s benefit but coughs/ wheezes when singing    Review of Systems  Constitutional: Positive for unexpected weight change. Negative for fever.  HENT: Negative for ear pain, nosebleeds, congestion, sore throat, rhinorrhea, sneezing, trouble swallowing, dental problem, postnasal drip and sinus pressure.   Eyes: Negative for redness and itching.  Respiratory: Positive for shortness of breath and wheezing. Negative for cough and chest tightness.   Cardiovascular: Positive for palpitations and leg swelling.  Gastrointestinal: Negative for nausea and vomiting.  Genitourinary: Negative for dysuria.  Musculoskeletal: Negative for joint swelling.  Skin: Negative for rash.  Neurological: Positive for headaches.  Hematological: Does not bruise/bleed easily.  Psychiatric/Behavioral: Negative for dysphoric mood. The patient is not nervous/anxious.        Objective:   Physical Exam amb obese bf nad Wt Readings from Last 3 Encounters:  09/04/11 207 lb (93.895 kg)  HEENT: nl dentition, turbinates, and orophanx. Nl  external ear canals without cough reflex   NECK :  without JVD/Nodes/TM/ nl carotid upstrokes bilaterally   LUNGS: no acc muscle use, clear to A and P bilaterally without cough on insp or exp maneuvers   CV:  RRR  no s3 or murmur or increase in P2, no edema   ABD:  soft and nontender with nl excursion in the supine position. No bruits or organomegaly, bowel sounds nl  MS:  warm without deformities, calf tenderness, cyanosis or clubbing  SKIN: warm and dry without lesions    NEURO:  alert, approp, no deficits   cxr 07/31/11 No active dz by Toma Copier Report           Assessment & Plan:

## 2011-09-04 NOTE — Assessment & Plan Note (Addendum)
Symptoms are markedly disproportionate to objective findings and not clear this is a lung problem but pt does appear to have difficult airway management issues. DDX of  difficult airways managment all start with A and  include Adherence, Ace Inhibitors, Acid Reflux, Active Sinus Disease, Alpha 1 Antitripsin deficiency, Anxiety masquerading as Airways dz,  ABPA,  allergy(esp in young), Aspiration (esp in elderly), Adverse effects of DPI,  Active smokers, plus two Bs  = Bronchiectasis and Beta blocker use..and one C= CHF  ? Acid reflux > try nexium qam ac (not prn)  until sort this out  ? Beta blocker effects > try bystolic, see hbp

## 2011-10-11 ENCOUNTER — Ambulatory Visit (INDEPENDENT_AMBULATORY_CARE_PROVIDER_SITE_OTHER): Payer: 59 | Admitting: Internal Medicine

## 2011-10-11 ENCOUNTER — Encounter: Payer: Self-pay | Admitting: Internal Medicine

## 2011-10-11 VITALS — BP 120/88 | HR 72 | Temp 97.2°F | Ht 65.0 in | Wt 211.4 lb

## 2011-10-11 DIAGNOSIS — R0989 Other specified symptoms and signs involving the circulatory and respiratory systems: Secondary | ICD-10-CM

## 2011-10-11 DIAGNOSIS — R0609 Other forms of dyspnea: Secondary | ICD-10-CM

## 2011-10-11 DIAGNOSIS — R0600 Dyspnea, unspecified: Secondary | ICD-10-CM

## 2011-10-11 MED ORDER — MOMETASONE FURO-FORMOTEROL FUM 100-5 MCG/ACT IN AERO: 2.0000 | INHALATION_SPRAY | Freq: Two times a day (BID) | RESPIRATORY_TRACT | Status: DC

## 2011-10-11 NOTE — Assessment & Plan Note (Signed)
-   PFT's 08/03/11 FEV1 2.77 (90%) ratio 79 no change p B2, DLCO 71 corrects to 107    - Trial off coreg > bystolic 20 mg starting 09/04/11> no better    - 09/04/2011   Walked RA x one lap @ 185 stopped due to  Sob, no desat   -  75% hfa p coaching 10/11/2011   I had an extended discussion with the patient today lasting 15 to 20 minutes of a 25 minute visit on the following issues:   Not clear whether this is obsesty/ anxiety or asthma so try dulera x one month then consider mct if dx still in doubt - in meantime needs to be doing paced ex

## 2011-10-11 NOTE — Patient Instructions (Addendum)
Dulera 100 Take 2 puffs first thing in am and then another 2 puffs about 12 hours later.    Work on inhaler technique:  relax and gently blow all the way out then take a nice smooth deep breath back in, triggering the inhaler at same time you start breathing in.  Hold for up to 5 seconds if you can.  Rinse and gargle with water when done   If your mouth or throat starts to bother you,   I suggest you time the inhaler to your dental care and after using the inhaler(s) brush teeth and tongue with a baking soda containing toothpaste and when you rinse this out, gargle with it first to see if this helps your mouth and throat.    Weight control is simply a matter of calorie balance which needs to be tilted in your favor by eating less and exercising more.  To get the most out of exercise, you need to be continuously aware that you are short of breath, but never out of breath, for 30 minutes daily. As you improve, it will actually be easier for you to do the same amount of exercise  in  30 minutes so always push to the level where you are short of breath.  If this does not result in gradual weight reduction then I strongly recommend you see a nutritionist with a food diary x 2 weeks so that we can work out a negative calorie balance which is universally effective in steady weight loss programs.  Think of your calorie balance like you do your bank account where in this case you want the balance to go down so you must take in less calories than you burn up.  It's just that simple:  Hard to do, but easy to understand.  Good luck!    Please schedule a follow up office visit in 4 weeks, sooner if needed
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
## 2011-11-08 ENCOUNTER — Ambulatory Visit: Payer: Medicare Other | Admitting: Internal Medicine

## 2011-12-03 ENCOUNTER — Ambulatory Visit (INDEPENDENT_AMBULATORY_CARE_PROVIDER_SITE_OTHER): Payer: 59 | Admitting: Adult Health

## 2011-12-03 ENCOUNTER — Encounter: Payer: Self-pay | Admitting: Adult Health

## 2011-12-03 VITALS — BP 108/80 | HR 78 | Temp 98.7°F | Ht 65.0 in | Wt 214.3 lb

## 2011-12-03 DIAGNOSIS — R0989 Other specified symptoms and signs involving the circulatory and respiratory systems: Secondary | ICD-10-CM

## 2011-12-03 DIAGNOSIS — R0600 Dyspnea, unspecified: Secondary | ICD-10-CM

## 2011-12-03 MED ORDER — MOMETASONE FURO-FORMOTEROL FUM 100-5 MCG/ACT IN AERO: 2.0000 | INHALATION_SPRAY | Freq: Two times a day (BID) | RESPIRATORY_TRACT | Status: DC

## 2011-12-03 NOTE — Patient Instructions (Addendum)
Restart Dulera 100 Take 2 puffs first thing in am and then another 2 puffs about 12 hours later.  Follow up Dr. Sherene Sires  In  2 months and As needed   Please contact office for sooner follow up if symptoms do not improve or worsen or seek emergency care

## 2011-12-03 NOTE — Progress Notes (Signed)
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  09/04/2011 1st pulmonary eval cc variable sob but always when exerts like one flight of steps and ventolin seemed to helped the most, resting did not always relieve it but never comes on at rest. Also with exposure to paint fumes.. Takes nexium x 3-4 months s benefit but coughs/ wheezes when singing  rec bystolic 20 mg one daily ok to take twice daily if needed nexium 40 mg Take 30-60 min before first meal of the day  GERD diet   10/11/2011 f/u ov/Wert could not tolerate bystolic due to ha but no change on breathing p a week so went back on coreg. No longer using ventolin "wheezing" resolves with or without ventolin. Adherent with gerd rx.  No obvious daytime variabilty or assoc chronic cough or cp or chest tightness, subjective wheeze overt sinus or hb symptoms. No unusual exp hx or h/o childhood pna/ asthma or premature birth to her knowledge.   >Dulera rx    12/03/2011 Acute OV  Complains of increased SOB, tightness/pressure in chest for 3 weeks.  Was given a sample last ov, felt better on this but ran out of [facility]  .  Since stopping Dulera feels symptoms returning.   reports symptoms had resolved with the dulera    No chest pain , orthopnea, edema, weight loss.  Needs rx sent to pharmacy.  Missed follow up ov with Dr. Sherene Sires  , due to husband having stroke.      ROS;  Constitutional:   No  weight loss, night sweats,  Fevers, chills, fatigue, or  lassitude.  HEENT:   No headaches,  Difficulty swallowing,  Tooth/dental problems, or  Sore throat,                No sneezing, itching, ear ache, nasal congestion, post nasal drip,   CV:  No chest pain,  Orthopnea, PND,  swelling in lower extremities, anasarca, dizziness, palpitations, syncope.   GI  No heartburn, indigestion, abdominal pain, nausea, vomiting, diarrhea, change in bowel habits, loss of appetite, bloody stools.   Resp:   No coughing up of blood.   No wheezing.  No chest wall deformity  Skin: no rash or lesions.  GU: no dysuria, change in color of urine, no urgency or frequency.  No flank pain, no hematuria   MS:  No joint pain or swelling.  No decreased range of motion.  No back pain.  Psych:  No change in mood or affect. No depression or anxiety.  No memory loss.          Objective:   Physical Exam amb obese bf na   HEENT: nl dentition, turbinates, and orophanx. Nl external ear canals without cough reflex   NECK :  without JVD/Nodes/TM/ nl carotid upstrokes bilaterally   LUNGS: no acc muscle use, clear to A and P bilaterally without cough on insp or exp maneuvers   CV:  RRR  no s3 or murmur or increase in P2, no edema   ABD:  soft and nontender with nl excursion in the supine position. No bruits or organomegaly, bowel sounds nl  MS:  warm without deformities, calf tenderness, cyanosis or clubbing  SKIN: warm and dry without lesions    NEURO:  alert, approp, no deficits   cxr 07/31/11 No active dz by Toma Copier Report           Assessment & Plan:

## 2011-12-03 NOTE — Assessment & Plan Note (Signed)
Dyspnea ? Asthmatic component  Improved control with Dulera  cxr in never smoker nml 09/2011   Plan:   rec to restart Dulera  follow up 2 months and As needed

## 2012-02-04 ENCOUNTER — Telehealth: Payer: Self-pay | Admitting: Internal Medicine

## 2012-02-04 NOTE — Telephone Encounter (Signed)
Attempted to call patient 3x to schedule a follow up appointment. No return call back. Sent letter 02/04/12 °

## 2012-04-14 ENCOUNTER — Encounter (HOSPITAL_COMMUNITY): Payer: Self-pay | Admitting: Emergency Medicine

## 2012-04-14 ENCOUNTER — Emergency Department (HOSPITAL_COMMUNITY): Payer: Medicare Other

## 2012-04-14 ENCOUNTER — Emergency Department (HOSPITAL_COMMUNITY)
Admission: EM | Admit: 2012-04-14 | Discharge: 2012-04-14 | Disposition: A | Discharge status: Home / Self Care | Payer: Medicare Other | Attending: Emergency Medicine | Admitting: Emergency Medicine

## 2012-04-14 DIAGNOSIS — Z8673 Personal history of transient ischemic attack (TIA), and cerebral infarction without residual deficits: Secondary | ICD-10-CM | POA: Insufficient documentation

## 2012-04-14 DIAGNOSIS — J45909 Unspecified asthma, uncomplicated: Secondary | ICD-10-CM | POA: Insufficient documentation

## 2012-04-14 DIAGNOSIS — Z8719 Personal history of other diseases of the digestive system: Secondary | ICD-10-CM | POA: Insufficient documentation

## 2012-04-14 DIAGNOSIS — M7918 Myalgia, other site: Secondary | ICD-10-CM

## 2012-04-14 DIAGNOSIS — Y92410 Unspecified street and highway as the place of occurrence of the external cause: Secondary | ICD-10-CM | POA: Insufficient documentation

## 2012-04-14 DIAGNOSIS — Z8711 Personal history of peptic ulcer disease: Secondary | ICD-10-CM | POA: Insufficient documentation

## 2012-04-14 DIAGNOSIS — Y9389 Activity, other specified: Secondary | ICD-10-CM | POA: Insufficient documentation

## 2012-04-14 DIAGNOSIS — I10 Essential (primary) hypertension: Secondary | ICD-10-CM | POA: Insufficient documentation

## 2012-04-14 DIAGNOSIS — IMO0001 Reserved for inherently not codable concepts without codable children: Secondary | ICD-10-CM | POA: Insufficient documentation

## 2012-04-14 DIAGNOSIS — E785 Hyperlipidemia, unspecified: Secondary | ICD-10-CM | POA: Insufficient documentation

## 2012-04-14 DIAGNOSIS — Z79899 Other long term (current) drug therapy: Secondary | ICD-10-CM | POA: Insufficient documentation

## 2012-04-14 DIAGNOSIS — S0990XA Unspecified injury of head, initial encounter: Secondary | ICD-10-CM | POA: Insufficient documentation

## 2012-04-14 DIAGNOSIS — R109 Unspecified abdominal pain: Secondary | ICD-10-CM | POA: Insufficient documentation

## 2012-04-14 DIAGNOSIS — Z8739 Personal history of other diseases of the musculoskeletal system and connective tissue: Secondary | ICD-10-CM | POA: Insufficient documentation

## 2012-04-14 DIAGNOSIS — S3981XA Other specified injuries of abdomen, initial encounter: Secondary | ICD-10-CM | POA: Insufficient documentation

## 2012-04-14 DIAGNOSIS — S298XXA Other specified injuries of thorax, initial encounter: Secondary | ICD-10-CM | POA: Insufficient documentation

## 2012-04-14 DIAGNOSIS — Z8679 Personal history of other diseases of the circulatory system: Secondary | ICD-10-CM | POA: Insufficient documentation

## 2012-04-14 LAB — COMPREHENSIVE METABOLIC PANEL
AST: 17 U/L (ref 0–37)
Albumin: 3.8 g/dL (ref 3.5–5.2)
Alkaline Phosphatase: 72 U/L (ref 39–117)
BUN: 14 mg/dL (ref 6–23)
Chloride: 102 mEq/L (ref 96–112)
Potassium: 3.3 mEq/L — ABNORMAL LOW (ref 3.5–5.1)
Total Bilirubin: 0.3 mg/dL (ref 0.3–1.2)
Total Protein: 7.4 g/dL (ref 6.0–8.3)

## 2012-04-14 LAB — CBC WITH DIFFERENTIAL/PLATELET
Eosinophils Absolute: 0.1 10*3/uL (ref 0.0–0.7)
Eosinophils Relative: 3 % (ref 0–5)
HCT: 36.6 % (ref 36.0–46.0)
Lymphocytes Relative: 49 % — ABNORMAL HIGH (ref 12–46)
Lymphs Abs: 1.8 10*3/uL (ref 0.7–4.0)
MCH: 30.1 pg (ref 26.0–34.0)
MCV: 85.3 fL (ref 78.0–100.0)
Monocytes Absolute: 0.3 10*3/uL (ref 0.1–1.0)
RBC: 4.29 MIL/uL (ref 3.87–5.11)
RDW: 14.3 % (ref 11.5–15.5)
WBC: 3.7 10*3/uL — ABNORMAL LOW (ref 4.0–10.5)

## 2012-04-14 MED ORDER — OXYCODONE-ACETAMINOPHEN 5-325 MG PO TABS: ORAL_TABLET | ORAL | Status: DC

## 2012-04-14 MED ORDER — DIAZEPAM 5 MG PO TABS: 5.0000 mg | ORAL_TABLET | Freq: Four times a day (QID) | ORAL | Status: DC | PRN

## 2012-04-14 MED ORDER — IOHEXOL 300 MG/ML  SOLN
100.0000 mL | Freq: Once | INTRAMUSCULAR | Status: AC | PRN
  Administered 2012-04-14: 100 mL via INTRAVENOUS

## 2012-04-14 MED ORDER — ONDANSETRON HCL 4 MG/2ML IJ SOLN
4.0000 mg | Freq: Once | INTRAMUSCULAR | Status: AC
  Administered 2012-04-14: 4 mg via INTRAVENOUS
  Filled 2012-04-14: qty 2

## 2012-04-14 MED ORDER — MORPHINE SULFATE 4 MG/ML IJ SOLN
4.0000 mg | Freq: Once | INTRAMUSCULAR | Status: AC
  Administered 2012-04-14: 4 mg via INTRAVENOUS
  Filled 2012-04-14: qty 1
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
## 2012-04-14 NOTE — ED Notes (Signed)
Pt discharged.Vital signs stable and GCS 15 

## 2012-04-15 NOTE — ED Provider Notes (Signed)
Medical screening examination/treatment/procedure(s) were performed by non-physician practitioner and as supervising physician I was immediately available for consultation/collaboration.   Gwyneth Sprout, MD 04/15/12 2158

## 2012-07-02 ENCOUNTER — Inpatient Hospital Stay (HOSPITAL_COMMUNITY)
Admission: EM | Admit: 2012-07-02 | Discharge: 2012-07-04 | DRG: 690 | Disposition: A | Discharge status: Home / Self Care | Payer: Medicare Other | Attending: Internal Medicine | Admitting: Internal Medicine

## 2012-07-02 ENCOUNTER — Encounter (HOSPITAL_COMMUNITY): Payer: Self-pay | Admitting: Family Medicine

## 2012-07-02 DIAGNOSIS — N179 Acute kidney failure, unspecified: Secondary | ICD-10-CM | POA: Diagnosis present

## 2012-07-02 DIAGNOSIS — E860 Dehydration: Secondary | ICD-10-CM | POA: Diagnosis present

## 2012-07-02 DIAGNOSIS — N390 Urinary tract infection, site not specified: Principal | ICD-10-CM

## 2012-07-02 DIAGNOSIS — G43909 Migraine, unspecified, not intractable, without status migrainosus: Secondary | ICD-10-CM | POA: Diagnosis present

## 2012-07-02 DIAGNOSIS — R0600 Dyspnea, unspecified: Secondary | ICD-10-CM

## 2012-07-02 DIAGNOSIS — N12 Tubulo-interstitial nephritis, not specified as acute or chronic: Secondary | ICD-10-CM

## 2012-07-02 DIAGNOSIS — E876 Hypokalemia: Secondary | ICD-10-CM | POA: Diagnosis present

## 2012-07-02 DIAGNOSIS — E785 Hyperlipidemia, unspecified: Secondary | ICD-10-CM | POA: Diagnosis present

## 2012-07-02 DIAGNOSIS — Z79899 Other long term (current) drug therapy: Secondary | ICD-10-CM

## 2012-07-02 DIAGNOSIS — I10 Essential (primary) hypertension: Secondary | ICD-10-CM | POA: Diagnosis present

## 2012-07-02 DIAGNOSIS — J45909 Unspecified asthma, uncomplicated: Secondary | ICD-10-CM | POA: Diagnosis present

## 2012-07-02 DIAGNOSIS — Z8673 Personal history of transient ischemic attack (TIA), and cerebral infarction without residual deficits: Secondary | ICD-10-CM

## 2012-07-02 HISTORY — DX: Urinary tract infection, site not specified: N39.0

## 2012-07-02 HISTORY — DX: Personal history of other diseases of the digestive system: Z87.19

## 2012-07-02 HISTORY — DX: Shortness of breath: R06.02

## 2012-07-02 LAB — COMPREHENSIVE METABOLIC PANEL
ALT: 16 U/L (ref 0–35)
AST: 22 U/L (ref 0–37)
Albumin: 3.3 g/dL — ABNORMAL LOW (ref 3.5–5.2)
Alkaline Phosphatase: 63 U/L (ref 39–117)
CO2: 27 mEq/L (ref 19–32)
Chloride: 98 mEq/L (ref 96–112)
GFR calc non Af Amer: 40 mL/min — ABNORMAL LOW (ref >90)
Potassium: 2.6 mEq/L — CL (ref 3.5–5.1)
Sodium: 138 mEq/L (ref 135–145)
Total Bilirubin: 0.8 mg/dL (ref 0.3–1.2)

## 2012-07-02 LAB — URINALYSIS, ROUTINE W REFLEX MICROSCOPIC
Bilirubin Urine: NEGATIVE
Glucose, UA: NEGATIVE mg/dL
Hgb urine dipstick: NEGATIVE
Ketones, ur: 15 mg/dL — AB
Protein, ur: 100 mg/dL — AB
pH: 8.5 — ABNORMAL HIGH (ref 5.0–8.0)

## 2012-07-02 LAB — URINE MICROSCOPIC-ADD ON

## 2012-07-02 LAB — CBC WITH DIFFERENTIAL/PLATELET
Basophils Absolute: 0 10*3/uL (ref 0.0–0.1)
Basophils Relative: 0 % (ref 0–1)
HCT: 34.6 % — ABNORMAL LOW (ref 36.0–46.0)
Lymphocytes Relative: 18 % (ref 12–46)
MCHC: 34.7 g/dL (ref 30.0–36.0)
Monocytes Absolute: 1.4 10*3/uL — ABNORMAL HIGH (ref 0.1–1.0)
Neutro Abs: 7.6 10*3/uL (ref 1.7–7.7)
Neutrophils Relative %: 69 % (ref 43–77)
RDW: 14 % (ref 11.5–15.5)
WBC: 11 10*3/uL — ABNORMAL HIGH (ref 4.0–10.5)

## 2012-07-02 LAB — CBC
MCH: 29.9 pg (ref 26.0–34.0)
MCHC: 33.9 g/dL (ref 30.0–36.0)
MCV: 88.5 fL (ref 78.0–100.0)
Platelets: 154 10*3/uL (ref 150–400)
RDW: 14.3 % (ref 11.5–15.5)
WBC: 9.9 10*3/uL (ref 4.0–10.5)

## 2012-07-02 LAB — CREATININE, SERUM: GFR calc Af Amer: 56 mL/min — ABNORMAL LOW (ref >90)

## 2012-07-02 LAB — POCT I-STAT TROPONIN I

## 2012-07-02 MED ORDER — POTASSIUM CHLORIDE 10 MEQ/100ML IV SOLN
10.0000 meq | INTRAVENOUS | Status: AC
  Administered 2012-07-02 (×5): 10 meq via INTRAVENOUS
  Filled 2012-07-02 (×4): qty 100

## 2012-07-02 MED ORDER — POTASSIUM CHLORIDE 10 MEQ/100ML IV SOLN
INTRAVENOUS | Status: AC
  Administered 2012-07-02: 10 meq
  Filled 2012-07-02: qty 100

## 2012-07-02 MED ORDER — ONDANSETRON HCL 4 MG PO TABS: 4.0000 mg | ORAL_TABLET | Freq: Four times a day (QID) | ORAL | Status: DC | PRN

## 2012-07-02 MED ORDER — SODIUM CHLORIDE 0.9 % IV SOLN
INTRAVENOUS | Status: AC
  Administered 2012-07-02: 17:00:00 via INTRAVENOUS

## 2012-07-02 MED ORDER — CEFTRIAXONE SODIUM 1 G IJ SOLR
1.0000 g | INTRAMUSCULAR | Status: DC
  Administered 2012-07-03: 1 g via INTRAVENOUS
  Filled 2012-07-02 (×2): qty 10

## 2012-07-02 MED ORDER — DEXTROSE 5 % IV SOLN
1.0000 g | Freq: Once | INTRAVENOUS | Status: AC
  Administered 2012-07-02: 1 g via INTRAVENOUS
  Filled 2012-07-02: qty 10

## 2012-07-02 MED ORDER — ONDANSETRON HCL 4 MG/2ML IJ SOLN: 4.0000 mg | Freq: Four times a day (QID) | INTRAMUSCULAR | Status: DC | PRN

## 2012-07-02 MED ORDER — POTASSIUM CHLORIDE IN NACL 20-0.9 MEQ/L-% IV SOLN
INTRAVENOUS | Status: DC
  Filled 2012-07-02 (×3): qty 1000

## 2012-07-02 MED ORDER — ACETAMINOPHEN 325 MG PO TABS
650.0000 mg | ORAL_TABLET | Freq: Four times a day (QID) | ORAL | Status: DC | PRN
  Administered 2012-07-02: 650 mg via ORAL
  Filled 2012-07-02 (×2): qty 2

## 2012-07-02 MED ORDER — ONDANSETRON HCL 4 MG/2ML IJ SOLN
4.0000 mg | Freq: Once | INTRAMUSCULAR | Status: AC
  Administered 2012-07-02: 4 mg via INTRAVENOUS
  Filled 2012-07-02: qty 2

## 2012-07-02 MED ORDER — ENOXAPARIN SODIUM 40 MG/0.4ML ~~LOC~~ SOLN
40.0000 mg | SUBCUTANEOUS | Status: DC
  Administered 2012-07-02 – 2012-07-03 (×2): 40 mg via SUBCUTANEOUS
  Filled 2012-07-02 (×3): qty 0.4

## 2012-07-02 MED ORDER — MORPHINE SULFATE 4 MG/ML IJ SOLN
4.0000 mg | Freq: Once | INTRAMUSCULAR | Status: AC
  Administered 2012-07-02: 4 mg via INTRAVENOUS
  Filled 2012-07-02: qty 1

## 2012-07-02 MED ORDER — ACETAMINOPHEN 650 MG RE SUPP: 650.0000 mg | Freq: Four times a day (QID) | RECTAL | Status: DC | PRN

## 2012-07-02 MED ORDER — SODIUM CHLORIDE 0.9 % IV BOLUS (SEPSIS)
1000.0000 mL | Freq: Once | INTRAVENOUS | Status: AC
  Administered 2012-07-02: 1000 mL via INTRAVENOUS

## 2012-07-02 MED ORDER — ZOLPIDEM TARTRATE 5 MG PO TABS
5.0000 mg | ORAL_TABLET | Freq: Every evening | ORAL | Status: DC | PRN
  Administered 2012-07-03 – 2012-07-04 (×2): 5 mg via ORAL
  Filled 2012-07-02 (×2): qty 1

## 2012-07-02 MED ORDER — MORPHINE SULFATE 2 MG/ML IJ SOLN: 1.0000 mg | INTRAMUSCULAR | Status: DC | PRN

## 2012-07-02 NOTE — ED Provider Notes (Signed)
History    CSN: [id number] Arrival date & time 07/02/12  1238  First MD Initiated Contact with Patient 07/02/12 1340     Chief Complaint  Patient presents with  . Fatigue  . Headache  . Emesis   (Consider location/radiation/quality/duration/timing/severity/associated sxs/prior Treatment) HPI Comments: Pt states Monday afternoon (2 days PTA) she developed myalgias, N/V, headache and urinary frequency.  Seen at urgent clinic yesterday and checked UA but told her she needed to f/u with her PCP to get her kidney's checked and gave her a shot of abx but told her the urine was normal.  Pt states still nauseated adn vomiting.  No able to hold down fluids and sleeping constantly.  Denies any tick exposure and states only thing different was B12 shot on Monday around 2 before sx started.  Denies abd pain and no neck pain.  No rash or diarrhea.  Patient is a 57 y.o. female presenting with headaches and vomiting. The history is provided by the patient.  Headache Pain location:  Generalized Quality:  Dull Radiates to:  Does not radiate Timing:  Constant Progression:  Unchanged Chronicity:  New Associated symptoms: fatigue, fever, myalgias, nausea and vomiting   Associated symptoms: no abdominal pain, no cough and no diarrhea   Associated symptoms comment:  Urinary frequency Nausea:    Severity:  Severe   Onset quality:  Sudden   Duration:  3 days   Timing:  Constant   Progression:  Unchanged Vomiting:    Quality:  Stomach contents   Severity:  Moderate   Duration:  3 days   Timing:  Intermittent   Progression:  Unchanged Emesis Associated symptoms: headaches and myalgias   Associated symptoms: no abdominal pain and no diarrhea    Past Medical History  Diagnosis Date  . Migraine   . Back problem     BAD DISK IN BACK  . Hypertension   . Hyperlipidemia   . Knee problem   . Abdominal cramps   . Leg cramps   . Ulcer     STOMACH & SORENESS & PAIN  . Asthma   . Stroke    Past  Surgical History  Procedure Laterality Date  . Abdominal hysterectomy    . Hemorrhoid surgery     Family History  Problem Relation Age of Onset  . Hypertension Mother   . Hyperlipidemia Mother   . Hypertension Brother   . Asthma Father   . Cancer Father    History  Substance Use Topics  . Smoking status: Never Smoker   . Smokeless tobacco: Never Used  . Alcohol Use: No   OB History   Grav Para Term Preterm Abortions TAB SAB Ect Mult Living                 Review of Systems  Constitutional: Positive for fever and fatigue.  Respiratory: Negative for cough.   Cardiovascular: Negative for chest pain.  Gastrointestinal: Positive for nausea and vomiting. Negative for abdominal pain and diarrhea.  Genitourinary: Positive for frequency. Negative for dysuria, hematuria and flank pain.  Musculoskeletal: Positive for myalgias.  Neurological: Positive for weakness and headaches.  All other systems reviewed and are negative.    Allergies  Bystolic and Naproxen  Home Medications   Current Outpatient Rx  Name  Route  Sig  Dispense  Refill  . Biotin 5000 MCG TABS   Oral   Take 10,000 mcg by mouth daily.          Marland Kitchen  carvedilol (COREG) 25 MG tablet   Oral   Take 25 mg by mouth 2 (two) times daily with a meal.         . chlorproMAZINE (THORAZINE) 25 MG tablet   Oral   Take 25 mg by mouth daily. Can take up to 3 times per 30 days         . Cyanocobalamin (VITAMIN B-12 IJ)   Injection   Inject 1,000 mcg as directed every 30 (thirty) days.          . cyclobenzaprine (FLEXERIL) 10 MG tablet   Oral   Take 10 mg by mouth 3 (three) times daily as needed for muscle spasms.          . diazepam (VALIUM) 5 MG tablet   Oral   Take 1 tablet (5 mg total) by mouth every 6 (six) hours as needed (muscle spasms).   10 tablet   0   . ezetimibe (ZETIA) 10 MG tablet   Oral   Take 10 mg by mouth daily.         . hydrochlorothiazide (HYDRODIURIL) 25 MG tablet   Oral   Take  25 mg by mouth daily.         Marland Kitchen oxyCODONE-acetaminophen (PERCOCET/ROXICET) 5-325 MG per tablet   Oral   Take 1-2 tablets by mouth every 6 (six) hours as needed for pain.         . pantoprazole (PROTONIX) 40 MG tablet   Oral   Take 40 mg by mouth daily before breakfast.         . traMADol (ULTRAM) 50 MG tablet   Oral   Take 50 mg by mouth every 6 (six) hours as needed for pain.         Marland Kitchen trolamine salicylate (ASPERCREME) 10 % cream   Topical   Apply 1 application topically daily as needed (for leg muscle pain).          BP 105/64  Pulse 101  Temp(Src) 99.6 F (37.6 C) (Oral)  Resp 18  SpO2 100% Physical Exam  Nursing note and vitals reviewed. Constitutional: She is oriented to person, place, and time. She appears well-developed and well-nourished. No distress.  HENT:  Head: Normocephalic and atraumatic.  Mouth/Throat: Oropharynx is clear and moist. Mucous membranes are dry.  Eyes: Conjunctivae and EOM are normal. Pupils are equal, round, and reactive to light.  Neck: Normal range of motion. Neck supple. No spinous process tenderness and no muscular tenderness present. No Brudzinski's sign and no Kernig's sign noted.  Cardiovascular: Regular rhythm and intact distal pulses.  Tachycardia present.   No murmur heard. Pulmonary/Chest: Effort normal and breath sounds normal. No respiratory distress. She has no wheezes. She has no rales.  Abdominal: Soft. She exhibits no distension. There is no tenderness. There is no rebound and no guarding.  Musculoskeletal: Normal range of motion. She exhibits no edema and no tenderness.  Neurological: She is alert and oriented to person, place, and time.  Skin: Skin is warm and dry. No rash noted. No erythema.  Psychiatric: She has a normal mood and affect. Her behavior is normal.    ED Course  Procedures (including critical care time) Labs Reviewed  CBC WITH DIFFERENTIAL - Abnormal; Notable for the following:    WBC 11.0 (*)     HCT 34.6 (*)    Monocytes Relative 13 (*)    Monocytes Absolute 1.4 (*)    All other components within normal limits  COMPREHENSIVE  METABOLIC PANEL - Abnormal; Notable for the following:    Potassium 2.6 (*)    Creatinine, Ser 1.42 (*)    Albumin 3.3 (*)    GFR calc non Af Amer 40 (*)    GFR calc Af Amer 47 (*)    All other components within normal limits  URINALYSIS, ROUTINE W REFLEX MICROSCOPIC - Abnormal; Notable for the following:    Color, Urine AMBER (*)    APPearance HAZY (*)    pH 8.5 (*)    Ketones, ur 15 (*)    Protein, ur 100 (*)    Leukocytes, UA MODERATE (*)    All other components within normal limits  LIPASE, BLOOD  CK  URINE MICROSCOPIC-ADD ON  POCT I-STAT TROPONIN I    Date: 07/02/2012  Rate: 90  Rhythm: normal sinus rhythm  QRS Axis: normal  Intervals: normal  ST/T Wave abnormalities: nonspecific ST/T changes  Conduction Disutrbances:none  Narrative Interpretation:   Old EKG Reviewed: changes noted new nonspecific t-wave inversion/flattening in V2-3   No results found. No diagnosis found.  MDM   Patient presenting with 3 days of generalized malaise, fatigue, persistent nausea, vomiting without abdominal pain, diarrhea and patient complains of an intermittent fever. She was seen at a clinic yesterday and given an IM shot of antibiotics and cause or source. She does complain of urinary frequency but denies dysuria and was started yesterday her urine was normal. The patient persists with nausea and vomiting and was told yesterday she needed to follow up with her regular doctor for kidney problems. She has no prior history of renal insufficiency. She denies any tick exposure or infectious diseases. No rash and patient is not display meningeal findings on exam but does complain of a mild headache.  Concern for uterine pathology versus rhabdo from recent injection possible reaction. However patient does not have throat, mouth swelling or shortness of  breath.  Patient given IV fluids and nausea control.  Pt found to have pyelonephritis with UTI.  Also hypokalemic today and dehydrated.  Mild EKG change with t-wave inversion but o/w ok.  Will admit and replace potassium as well as start rocephin for UTI.  Gwyneth Sprout, MD 07/02/12 1504

## 2012-07-02 NOTE — H&P (Signed)
[facility] History and Physical  Savannah Taylor ZOX:[id number] DOB: [birth year] DOA: 07/02/2012  Referring physician: Gwyneth Sprout, ER physician PCP: Ronnie Doss  Specialists: None  Chief Complaint: Nausea and vomiting  HPI: Savannah Taylor is a 57 y.o. female  Past oral history of hypertension who a few days ago started noticing this she was having some nausea and vomiting and generalized weakness. She did not note any dysuria. Her symptoms persisted she had a mild headache she came in the emergency room today. Patient is noted to have a white blood cell count of 11 with a moderate urinary tract infection. In addition she was noted to have a low potassium 2.6 and a slightly elevated creatinine of 1.4. Patient was given IV potassium supplementation started on IV fluids and given a dose of IV Rocephin. Hospitalists were called for further evaluation. After patient arrived on the floor, she was noted to have a fever spike of 101.6-this was noted after patient had received Rocephin in the emergency room.  Review of Systems: Patient seen after arrival to floor. Complain still of a mild headache as well as some mild nausea. Denies any vision changes, dysphasia, chest pain, palpitations, shortness of breath, wheeze, cough, abdominal pain, hematuria, dysuria, constipation, diarrhea, focal steady numbness or weakness or pain. Review of systems is otherwise negative.  Past Medical History  Diagnosis Date  . Migraine   . Back problem     BAD DISK IN BACK  . Hypertension   . Hyperlipidemia   . Knee problem   . Abdominal cramps   . Leg cramps   . Ulcer     STOMACH & SORENESS & PAIN  . Asthma   . Stroke    Past Surgical History  Procedure Laterality Date  . Abdominal hysterectomy    . Hemorrhoid surgery     Social History:  reports that she has never smoked. She has never used smokeless tobacco. She reports that she does not drink alcohol or use illicit drugs. Patient was  at home with her husband. She is normally able to participate in all activities of daily living without assistance.  Allergies  Allergen Reactions  . Bystolic (Nebivolol Hcl) Palpitations and Other (See Comments)    Heart races  . Naproxen Hives and Itching    Family History  Problem Relation Age of Onset  . Hypertension Mother   . Hyperlipidemia Mother   . Hypertension Brother   . Asthma Father   . Cancer Father     Prior to Admission medications   Medication Sig Start Date End Date Taking? Authorizing Provider  Biotin 5000 MCG TABS Take 10,000 mcg by mouth daily.    Yes Historical Provider, MD  carvedilol (COREG) 25 MG tablet Take 25 mg by mouth 2 (two) times daily with a meal.   Yes Historical Provider, MD  chlorproMAZINE (THORAZINE) 25 MG tablet Take 25 mg by mouth daily. Can take up to 3 times per 30 days   Yes Historical Provider, MD  Cyanocobalamin (VITAMIN B-12 IJ) Inject 1,000 mcg as directed every 30 (thirty) days.    Yes Historical Provider, MD  cyclobenzaprine (FLEXERIL) 10 MG tablet Take 10 mg by mouth 3 (three) times daily as needed for muscle spasms.    Yes Historical Provider, MD  diazepam (VALIUM) 5 MG tablet Take 1 tablet (5 mg total) by mouth every 6 (six) hours as needed (muscle spasms). 04/14/12  Yes Nicole Pisciotta, PA-C  ezetimibe (ZETIA) 10 MG tablet Take 10 mg  by mouth daily.   Yes Historical Provider, MD  hydrochlorothiazide (HYDRODIURIL) 25 MG tablet Take 25 mg by mouth daily.   Yes Historical Provider, MD  oxyCODONE-acetaminophen (PERCOCET/ROXICET) 5-325 MG per tablet Take 1-2 tablets by mouth every 6 (six) hours as needed for pain. 04/14/12  Yes Nicole Pisciotta, PA-C  pantoprazole (PROTONIX) 40 MG tablet Take 40 mg by mouth daily before breakfast.   Yes Historical Provider, MD  traMADol (ULTRAM) 50 MG tablet Take 50 mg by mouth every 6 (six) hours as needed for pain.   Yes Historical Provider, MD  trolamine salicylate (ASPERCREME) 10 % cream Apply 1  application topically daily as needed (for leg muscle pain).   Yes Historical Provider, MD   Physical Exam: Filed Vitals:   07/02/12 1400 07/02/12 1415 07/02/12 1500 07/02/12 1643  BP: 111/59 114/64 119/83 114/67  Pulse:    88  Temp:    101.1 F (38.4 C)  TempSrc:    Oral  Resp: 24 21 19 18   Height:    5\' 5"  (1.651 m)  Weight:    89 kg (196 lb 3.4 oz)  SpO2:    100%     General:  Alert and oriented x3, no acute distress, fatigued  Eyes: Sclera nonicteric him extra ocular movements are intact  ENT: Normocephalic, atraumatic, mucous members are slightly dry  Neck: Supple, no JVD  Cardiovascular: Regular rate and rhythm, S1-S2  Respiratory: Clear to auscultation bilaterally  Abdomen: Soft, nontender, nondistended, positive bowel sounds  Skin: No skin breaks, tears or lesions  Musculoskeletal: No clubbing or cyanosis or edema  Psychiatric: Patient is appropriate, no evidence of psychoses  Neurologic: No overt deficits  Labs on Admission:  Basic Metabolic Panel:  Recent Labs Lab 07/02/12 1251  NA 138  K 2.6*  CL 98  CO2 27  GLUCOSE 97  BUN 12  CREATININE 1.42*  CALCIUM 9.1   Liver Function Tests:  Recent Labs Lab 07/02/12 1251  AST 22  ALT 16  ALKPHOS 63  BILITOT 0.8  PROT 7.2  ALBUMIN 3.3*    Recent Labs Lab 07/02/12 1251  LIPASE 18   CBC:  Recent Labs Lab 07/02/12 1251  WBC 11.0*  NEUTROABS 7.6  HGB 12.0  HCT 34.6*  MCV 87.6  PLT 160   Cardiac Enzymes:  Recent Labs Lab 07/02/12 1354  CKTOTAL 144     EKG: Independently reviewed. Mild T wave flattening, otherwise unremarkable.  Assessment/Plan Principal Problem:   UTI (urinary tract infection): IV Rocephin. Noted fever following administration of Rocephin earlier in the emergency room. The patient is further fevers or elevated white blood cell count, changed broad-spectrum metabolic. Other obvious signs of infection.  Active Problems:   HTN (hypertension), benign: Holding  by mouth medications until pressure is better.   Hypokalemia: Replaced in the emergency room. Recheck level this evening.    ARF (acute renal failure): IV fluids.     Code Status: Full code  Family Communication: Left message with husband.  Disposition Plan: Home hopefully tomorrow  Time spent: 30 minutes  Hollice Espy [facility] Pager [phone]  If 7PM-7AM, please contact night-coverage [email or web address] Password [facility] 07/02/2012, 6:22 PM

## 2012-07-02 NOTE — ED Notes (Signed)
Called report to unit 6N. 

## 2012-07-02 NOTE — ED Notes (Signed)
Reports having b12 inj on Monday and since then having fatigue, sleeping a lot, headache, fever, low bp and vomiting. Denies diarrhea. bp 105/64.

## 2012-07-02 NOTE — ED Notes (Signed)
I gave the patient a warm blanket. 

## 2012-07-02 NOTE — ED Notes (Signed)
Attempted to call report to unit 6N.

## 2012-07-02 NOTE — ED Notes (Signed)
EDP made aware of critical Lab. K+2.6.

## 2012-07-02 NOTE — ED Notes (Signed)
Family at bedside. 

## 2012-07-03 ENCOUNTER — Encounter (HOSPITAL_COMMUNITY): Payer: Self-pay | Admitting: General Practice

## 2012-07-03 DIAGNOSIS — N12 Tubulo-interstitial nephritis, not specified as acute or chronic: Secondary | ICD-10-CM

## 2012-07-03 LAB — BASIC METABOLIC PANEL
BUN: 10 mg/dL (ref 6–23)
CO2: 23 mEq/L (ref 19–32)
Calcium: 8.6 mg/dL (ref 8.4–10.5)
Creatinine, Ser: 1.14 mg/dL — ABNORMAL HIGH (ref 0.50–1.10)
Glucose, Bld: 98 mg/dL (ref 70–99)

## 2012-07-03 LAB — CBC
HCT: 33 % — ABNORMAL LOW (ref 36.0–46.0)
MCH: 29.2 pg (ref 26.0–34.0)
MCHC: 32.7 g/dL (ref 30.0–36.0)
MCV: 89.2 fL (ref 78.0–100.0)
Platelets: 147 10*3/uL — ABNORMAL LOW (ref 150–400)
RDW: 14.4 % (ref 11.5–15.5)

## 2012-07-03 MED ORDER — KETOROLAC TROMETHAMINE 30 MG/ML IJ SOLN: 30.0000 mg | Freq: Three times a day (TID) | INTRAMUSCULAR | Status: DC | PRN

## 2012-07-03 MED ORDER — DIPHENHYDRAMINE HCL 50 MG/ML IJ SOLN
25.0000 mg | Freq: Once | INTRAMUSCULAR | Status: AC
  Administered 2012-07-03: 25 mg via INTRAVENOUS
  Filled 2012-07-03: qty 1

## 2012-07-03 MED ORDER — DIPHENHYDRAMINE HCL 50 MG/ML IJ SOLN: 25.0000 mg | Freq: Three times a day (TID) | INTRAMUSCULAR | Status: DC | PRN

## 2012-07-03 MED ORDER — DIPHENHYDRAMINE HCL 50 MG/ML IJ SOLN
INTRAMUSCULAR | Status: AC
  Administered 2012-07-03: 19:00:00
  Filled 2012-07-03: qty 1

## 2012-07-03 MED ORDER — POTASSIUM CHLORIDE IN NACL 20-0.9 MEQ/L-% IV SOLN
INTRAVENOUS | Status: DC
  Administered 2012-07-03: 14:00:00 via INTRAVENOUS
  Filled 2012-07-03 (×3): qty 1000

## 2012-07-03 MED ORDER — POTASSIUM CHLORIDE CRYS ER 20 MEQ PO TBCR
40.0000 meq | EXTENDED_RELEASE_TABLET | Freq: Once | ORAL | Status: AC
  Administered 2012-07-03: 40 meq via ORAL
  Filled 2012-07-03: qty 2

## 2012-07-03 MED ORDER — KETOROLAC TROMETHAMINE 30 MG/ML IJ SOLN
30.0000 mg | Freq: Once | INTRAMUSCULAR | Status: AC
  Administered 2012-07-03: 30 mg via INTRAMUSCULAR
  Filled 2012-07-03: qty 1

## 2012-07-03 MED ORDER — KETOROLAC TROMETHAMINE 30 MG/ML IJ SOLN
INTRAMUSCULAR | Status: AC
  Administered 2012-07-03: 30 mg
  Filled 2012-07-03: qty 1

## 2012-07-03 NOTE — Progress Notes (Signed)
UR completed 

## 2012-07-03 NOTE — Progress Notes (Signed)
[facility]                                                                                Patient Demographics  Savannah Taylor, is a 57 y.o. female, DOB - 22-Jun-1955, ZOX:[id number], WJX:[id number]  Admit date - 07/02/2012  Admitting Physician Hollice Espy, MD  Outpatient Primary MD for the patient is Ronnie Doss  LOS - 1   Chief Complaint  Patient presents with  . Fatigue  . Headache  . Emesis        Assessment & Plan    1. UTI (urinary tract infection) - causing generalized weakness and dysuria, better with empiric antibiotic which will be continued, follow urine culture.   2. Dehydration with acute renal failure and hyponatremia. Continue IV fluid supplementation with KCl ROM a we'll give oral potassium supplementation 2, renal function and potassium levels are improving, will repeat BMP in the morning   3. Migraine headaches. Mild generalized migratory, patient says in the past she has used IM trorodol shots with good effect which will be given along with Benadryl.   4. Hypertension. Blood pressure is stable we will monitor off medications.    Code Status: Full  Family Communication: Husband bedside  Disposition Plan: Home   Procedures    Consults     DVT Prophylaxis  Lovenox  Lab Results  Component Value Date   PLT 147* 07/03/2012    Medications  Scheduled Meds: . cefTRIAXone (ROCEPHIN)  IV  1 g Intravenous Q24H  . diphenhydrAMINE  25 mg Intravenous Once  . enoxaparin (LOVENOX) injection  40 mg Subcutaneous Q24H  . ketorolac  30 mg Intramuscular Once  . potassium chloride  40 mEq Oral Once   Continuous Infusions: . 0.9 % NaCl with KCl 20 mEq / L     PRN Meds:.acetaminophen, acetaminophen, morphine injection, ondansetron (ZOFRAN) IV, ondansetron, zolpidem  Antibiotics    Anti-infectives   Start     Dose/Rate Route Frequency Ordered Stop   07/03/12 1700  cefTRIAXone (ROCEPHIN) 1 g in dextrose 5 % 50 mL IVPB     1  g 100 mL/hr over 30 Minutes Intravenous Every 24 hours 07/02/12 1821     07/02/12 1515  cefTRIAXone (ROCEPHIN) 1 g in dextrose 5 % 50 mL IVPB     1 g 100 mL/hr over 30 Minutes Intravenous  Once 07/02/12 1506 07/02/12 1745       Time Spent in minutes   35   SINGH,PRASHANT K M.D on 07/03/2012 at 11:09 AM  Between 7am to 7pm - Pager - [phone]  After 7pm go to [email or web address] - password [email or web address]  And look for the night coverage person covering for me after hours  [facility] Office  [phone]    Subjective:   Savannah Taylor today has, mild migraine headache, No chest pain, No abdominal pain - No Nausea, No new weakness tingling or numbness, No Cough - SOB. Reduce to have some generalized weakness feels some better but not back to her baseline. Request to stay one more day.  Objective:   Filed Vitals:   07/02/12 1643 07/02/12 2235 07/03/12 0615 07/03/12 1010  BP: 114/67 105/52 114/68 110/66  Pulse: 88 86 72 72  Temp: 101.1 F (38.4 C) 100.4 F (38 C) 98.5 F (36.9 C) 98.2 F (36.8 C)  TempSrc: Oral Oral Oral   Resp: 18 20 18 20   Height: 5\' 5"  (1.651 m)     Weight: 89 kg (196 lb 3.4 oz)     SpO2: 100% 98% 100% 100%    Wt Readings from Last 3 Encounters:  07/02/12 89 kg (196 lb 3.4 oz)  12/03/11 97.206 kg (214 lb 4.8 oz)  10/11/11 95.89 kg (211 lb 6.4 oz)     Intake/Output Summary (Last 24 hours) at 07/03/12 1109 Last data filed at 07/03/12 0615  Gross per 24 hour  Intake      0 ml  Output      2 ml  Net     -2 ml    Exam Awake Alert, Oriented X 3, No new F.N deficits, Normal affect .AT,PERRAL Supple Neck,No JVD, No cervical lymphadenopathy appriciated.  Symmetrical Chest wall movement, Good air movement bilaterally, CTAB RRR,No Gallops,Rubs or new Murmurs, No Parasternal Heave +ve B.Sounds, Abd Soft, Non tender, No organomegaly appriciated, No rebound - guarding or rigidity. No Cyanosis, Clubbing or edema, No new Rash or bruise     Data  Review   Micro Results No results found for this or any previous visit (from the past 240 hour(s)).  Radiology Reports No results found.  CBC  Recent Labs Lab 07/02/12 1251 07/02/12 2038 07/03/12 0610  WBC 11.0* 9.9 6.7  HGB 12.0 10.9* 10.8*  HCT 34.6* 32.2* 33.0*  PLT 160 154 147*  MCV 87.6 88.5 89.2  MCH 30.4 29.9 29.2  MCHC 34.7 33.9 32.7  RDW 14.0 14.3 14.4  LYMPHSABS 2.0  --   --   MONOABS 1.4*  --   --   EOSABS 0.0  --   --   BASOSABS 0.0  --   --     Chemistries   Recent Labs Lab 07/02/12 1251 07/02/12 2038 07/03/12 0610  NA 138  --  139  K 2.6* 3.1* 3.0*  CL 98  --  103  CO2 27  --  23  GLUCOSE 97  --  98  BUN 12  --  10  CREATININE 1.42* 1.23* 1.14*  CALCIUM 9.1  --  8.6  AST 22  --   --   ALT 16  --   --   ALKPHOS 63  --   --   BILITOT 0.8  --   --    ------------------------------------------------------------------------------------------------------------------ estimated creatinine clearance is 60.7 ml/min (by C-G formula based on Cr of 1.14). ------------------------------------------------------------------------------------------------------------------ No results found for this basename: HGBA1C,  in the last 72 hours ------------------------------------------------------------------------------------------------------------------ No results found for this basename: CHOL, HDL, LDLCALC, TRIG, CHOLHDL, LDLDIRECT,  in the last 72 hours ------------------------------------------------------------------------------------------------------------------ No results found for this basename: TSH, T4TOTAL, FREET3, T3FREE, THYROIDAB,  in the last 72 hours ------------------------------------------------------------------------------------------------------------------ No results found for this basename: VITAMINB12, FOLATE, FERRITIN, TIBC, IRON, RETICCTPCT,  in the last 72 hours  Coagulation profile No results found for this basename: INR, PROTIME,  in  the last 168 hours  No results found for this basename: DDIMER,  in the last 72 hours  Cardiac Enzymes No results found for this basename: CK, CKMB, TROPONINI, MYOGLOBIN,  in the last 168 hours ------------------------------------------------------------------------------------------------------------------ No components found with this basename: POCBNP,

## 2012-07-04 LAB — MAGNESIUM: Magnesium: 2.1 mg/dL (ref 1.5–2.5)

## 2012-07-04 LAB — BASIC METABOLIC PANEL
Calcium: 8.9 mg/dL (ref 8.4–10.5)
Creatinine, Ser: 0.91 mg/dL (ref 0.50–1.10)
GFR calc Af Amer: 80 mL/min — ABNORMAL LOW (ref >90)
GFR calc non Af Amer: 69 mL/min — ABNORMAL LOW (ref >90)
Sodium: 141 mEq/L (ref 135–145)

## 2012-07-04 MED ORDER — CIPROFLOXACIN HCL 500 MG PO TABS: 500.0000 mg | ORAL_TABLET | Freq: Two times a day (BID) | ORAL | Status: DC

## 2012-07-04 MED ORDER — OXYCODONE-ACETAMINOPHEN 5-325 MG PO TABS: 1.0000 | ORAL_TABLET | Freq: Four times a day (QID) | ORAL | Status: DC | PRN

## 2012-07-04 NOTE — Discharge Summary (Signed)
[facility]                                                                                   Savannah Taylor, is a 57 y.o. female  DOB Aug 20, 1955  MRN [id number].  Admission date:  07/02/2012  Discharge Date:  07/04/2012  Primary MD  Ronnie Doss  Admitting Physician  Hollice Espy, MD  Admission Diagnosis  Hypokalemia [276.8] Pyelonephritis [590.80]  Discharge Diagnosis     Principal Problem:   UTI (urinary tract infection) Active Problems:   HTN (hypertension), benign   Hypokalemia   ARF (acute renal failure)    Past Medical History  Diagnosis Date  . Migraine   . Back problem     BAD DISK IN BACK  . Hypertension   . Hyperlipidemia   . Knee problem   . Abdominal cramps   . Leg cramps   . Ulcer     STOMACH & SORENESS & PAIN  . Asthma   . Stroke   . UTI (urinary tract infection) 07/02/2012  . H/O hiatal hernia   . Shortness of breath     Past Surgical History  Procedure Laterality Date  . Abdominal hysterectomy    . Hemorrhoid surgery       Recommendations for primary care physician for things to follow:   Please follow final urine culture results   Discharge Diagnoses:   Principal Problem:   UTI (urinary tract infection) Active Problems:   HTN (hypertension), benign   Hypokalemia   ARF (acute renal failure)    Discharge Condition: stable   Diet recommendation: See Discharge Instructions below   Consults     History of present illness and  Hospital Course:     Kindly see H&P for history of present illness and admission details, please review complete Labs, Consult reports and Test reports for all details in brief Savannah Taylor, is a 57 y.o. female, patient with history of migraine headaches, hypertension, dyslipidemia was admitted to the hospital with chief complaints of dehydration, acute renal failure and UTI along with hypokalemia, she was treated with IV fluids, potassium supplementation along with empiric IV  Rocephin with good results. She is now back to baseline and symptom-free, she is afebrile no leukocytosis urine culture so far are negative, she will be discharged home on 5 more days of oral ciprofloxacin, will request primary care physician to kindly review final urine culture results upon next visit. I will discontinue her HCTZ upon discharge.     For migraine headaches which are chronic, hypertension and dyslipidemia she will continue her home medications unchanged for HCTZ which will be discontinued as it cost of dehydration, hyponatremia and hypokalemia along with acute renal failure.    Today   Subjective:   Savannah Taylor today has no headache,no chest abdominal pain,no new weakness tingling or numbness, feels much better wants to go home today.    Objective:   Blood pressure 134/90, pulse 73, temperature 98.9 F (37.2 C), temperature source Oral, resp. rate 18, height 5\' 5"  (1.651 m), weight 89 kg (196 lb 3.4 oz), SpO2 98.00%.   Intake/Output Summary (Last 24 hours) at 07/04/12 0923 Last  data filed at 07/04/12 0540  Gross per 24 hour  Intake 2436.13 ml  Output      4 ml  Net 2432.13 ml    Exam Awake Alert, Oriented *3, No new F.N deficits, Normal affect [place].AT,PERRAL Supple Neck,No JVD, No cervical lymphadenopathy appriciated.  Symmetrical Chest wall movement, Good air movement bilaterally, CTAB RRR,No Gallops,Rubs or new Murmurs, No Parasternal Heave +ve B.Sounds, Abd Soft, Non tender, No organomegaly appriciated, No rebound -guarding or rigidity. No Cyanosis, Clubbing or edema, No new Rash or bruise  Data Review   Major procedures and Radiology Reports - PLEASE review detailed and final reports for all details in brief -       No results found.  Micro Results      No results found for this or any previous visit (from the past 240 hour(s)).   CBC w Diff: Lab Results  Component Value Date   WBC 6.7 07/03/2012   HGB 10.8* 07/03/2012   HCT 33.0* 07/03/2012    PLT 147* 07/03/2012   LYMPHOPCT 18 07/02/2012   MONOPCT 13* 07/02/2012   EOSPCT 0 07/02/2012   BASOPCT 0 07/02/2012    CMP: Lab Results  Component Value Date   NA 141 07/04/2012   K 3.7 07/04/2012   CL 110 07/04/2012   CO2 22 07/04/2012   BUN 7 07/04/2012   CREATININE 0.91 07/04/2012   PROT 7.2 07/02/2012   ALBUMIN 3.3* 07/02/2012   BILITOT 0.8 07/02/2012   ALKPHOS 63 07/02/2012   AST 22 07/02/2012   ALT 16 07/02/2012  .   Discharge Instructions     Follow with Primary MD Carilyn Goodpasture, PA-C in 7 days, he is getting a final urine culture results reviewed.  Get CBC, CMP, checked 7 days by Primary MD and again as instructed by your Primary MD.   Get Medicines reviewed and adjusted.  Please request your Prim.MD to go over all Hospital Tests and Procedure/Radiological results at the follow up, please get all Hospital records sent to your Prim MD by signing hospital release before you go home.  Activity: As tolerated with Full fall precautions use walker/cane & assistance as needed   Diet: Heart healthy  For Heart failure patients - Check your Weight same time everyday, if you gain over 2 pounds, or you develop in leg swelling, experience more shortness of breath or chest pain, call your Primary MD immediately. Follow Cardiac Low Salt Diet and 1.8 lit/day fluid restriction.  Disposition Home    If you experience worsening of your admission symptoms, develop shortness of breath, life threatening emergency, suicidal or homicidal thoughts you must seek medical attention immediately by calling 911 or calling your MD immediately  if symptoms less severe.  You Must read complete instructions/literature along with all the possible adverse reactions/side effects for all the Medicines you take and that have been prescribed to you. Take any new Medicines after you have completely understood and accpet all the possible adverse reactions/side effects.   Do not drive and provide baby sitting services if your were  admitted for syncope or siezures until you have seen by Primary MD or a Neurologist and advised to do so again.  Do not drive when taking Pain medications.    Do not take more than prescribed Pain, Sleep and Anxiety Medications  Special Instructions: If you have smoked or chewed Tobacco  in the last 2 yrs please stop smoking, stop any regular Alcohol  and or any Recreational drug use.  Wear Seat  belts while driving.   Please note  You were cared for by a hospitalist during your hospital stay. If you have any questions about your discharge medications or the care you received while you were in the hospital after you are discharged, you can call the unit and asked to speak with the hospitalist on call if the hospitalist that took care of you is not available. Once you are discharged, your primary care physician will handle any further medical issues. Please note that NO REFILLS for any discharge medications will be authorized once you are discharged, as it is imperative that you return to your primary care physician (or establish a relationship with a primary care physician if you do not have one) for your aftercare needs so that they can reassess your need for medications and monitor your lab values.       Follow-up Information   Follow up with [facility], PA-C. Schedule an appointment as soon as possible for a visit in 1 week.   Contact information:   [address] [place] [address] [phone]         Discharge Medications     Medication List    STOP taking these medications       hydrochlorothiazide 25 MG tablet  Commonly known as:  HYDRODIURIL      TAKE these medications       Biotin 5000 MCG Tabs  Take 10,000 mcg by mouth daily.     carvedilol 25 MG tablet  Commonly known as:  COREG  Take 25 mg by mouth 2 (two) times daily with a meal.     chlorproMAZINE 25 MG tablet  Commonly known as:  THORAZINE  Take 25 mg by mouth daily. Can  take up to 3 times per 30 days     ciprofloxacin 500 MG tablet  Commonly known as:  CIPRO  Take 1 tablet (500 mg total) by mouth 2 (two) times daily.     cyclobenzaprine 10 MG tablet  Commonly known as:  FLEXERIL  Take 10 mg by mouth 3 (three) times daily as needed for muscle spasms.     diazepam 5 MG tablet  Commonly known as:  VALIUM  Take 1 tablet (5 mg total) by mouth every 6 (six) hours as needed (muscle spasms).     oxyCODONE-acetaminophen 5-325 MG per tablet  Commonly known as:  PERCOCET/ROXICET  Take 1 tablet by mouth every 6 (six) hours as needed for pain.     pantoprazole 40 MG tablet  Commonly known as:  PROTONIX  Take 40 mg by mouth daily before breakfast.     traMADol 50 MG tablet  Commonly known as:  ULTRAM  Take 50 mg by mouth every 6 (six) hours as needed for pain.     trolamine salicylate 10 % cream  Commonly known as:  ASPERCREME  Apply 1 application topically daily as needed (for leg muscle pain).     VITAMIN B-12 IJ  Inject 1,000 mcg as directed every 30 (thirty) days.     ZETIA 10 MG tablet  Generic drug:  ezetimibe  Take 10 mg by mouth daily.           Total Time in preparing paper work, data evaluation and todays exam - 35 minutes  Leroy Sea M.D on 07/04/2012 at 9:23 AM  [facility] Office  [phone]

## 2012-07-04 NOTE — Progress Notes (Signed)
Pt for discharge home today.  Rx for percocet and cipro given and explained.  Pt took shower prior to leaving and tolerated it well.  Instructed pt to drink plenty of fluids at home to stay hydrated.  Pt is to call PA at [facility] for follow up.  # given to call if any problems, questions or concerns.  No further questions concerning home self care.

## 2012-10-05 ENCOUNTER — Emergency Department (HOSPITAL_COMMUNITY): Payer: Medicare Other

## 2012-10-05 ENCOUNTER — Emergency Department (HOSPITAL_COMMUNITY)
Admission: EM | Admit: 2012-10-05 | Discharge: 2012-10-05 | Disposition: A | Discharge status: Home / Self Care | Payer: Medicare Other | Attending: Emergency Medicine | Admitting: Emergency Medicine

## 2012-10-05 ENCOUNTER — Encounter (HOSPITAL_COMMUNITY): Payer: Self-pay | Admitting: *Deleted

## 2012-10-05 DIAGNOSIS — J45909 Unspecified asthma, uncomplicated: Secondary | ICD-10-CM | POA: Insufficient documentation

## 2012-10-05 DIAGNOSIS — Z8739 Personal history of other diseases of the musculoskeletal system and connective tissue: Secondary | ICD-10-CM | POA: Insufficient documentation

## 2012-10-05 DIAGNOSIS — Z87440 Personal history of urinary (tract) infections: Secondary | ICD-10-CM | POA: Insufficient documentation

## 2012-10-05 DIAGNOSIS — I10 Essential (primary) hypertension: Secondary | ICD-10-CM | POA: Insufficient documentation

## 2012-10-05 DIAGNOSIS — Y9229 Other specified public building as the place of occurrence of the external cause: Secondary | ICD-10-CM | POA: Insufficient documentation

## 2012-10-05 DIAGNOSIS — Z8673 Personal history of transient ischemic attack (TIA), and cerebral infarction without residual deficits: Secondary | ICD-10-CM | POA: Insufficient documentation

## 2012-10-05 DIAGNOSIS — T1490XA Injury, unspecified, initial encounter: Secondary | ICD-10-CM

## 2012-10-05 DIAGNOSIS — Z79899 Other long term (current) drug therapy: Secondary | ICD-10-CM | POA: Insufficient documentation

## 2012-10-05 DIAGNOSIS — T148XXA Other injury of unspecified body region, initial encounter: Secondary | ICD-10-CM

## 2012-10-05 DIAGNOSIS — Z862 Personal history of diseases of the blood and blood-forming organs and certain disorders involving the immune mechanism: Secondary | ICD-10-CM | POA: Insufficient documentation

## 2012-10-05 DIAGNOSIS — Y9389 Activity, other specified: Secondary | ICD-10-CM | POA: Insufficient documentation

## 2012-10-05 DIAGNOSIS — W19XXXA Unspecified fall, initial encounter: Secondary | ICD-10-CM | POA: Insufficient documentation

## 2012-10-05 DIAGNOSIS — S93609A Unspecified sprain of unspecified foot, initial encounter: Secondary | ICD-10-CM | POA: Insufficient documentation

## 2012-10-05 DIAGNOSIS — Z8639 Personal history of other endocrine, nutritional and metabolic disease: Secondary | ICD-10-CM | POA: Insufficient documentation

## 2012-10-05 DIAGNOSIS — S91309A Unspecified open wound, unspecified foot, initial encounter: Secondary | ICD-10-CM | POA: Insufficient documentation

## 2012-10-05 DIAGNOSIS — Z792 Long term (current) use of antibiotics: Secondary | ICD-10-CM | POA: Insufficient documentation

## 2012-10-05 DIAGNOSIS — G43909 Migraine, unspecified, not intractable, without status migrainosus: Secondary | ICD-10-CM | POA: Insufficient documentation

## 2012-10-05 DIAGNOSIS — Z8711 Personal history of peptic ulcer disease: Secondary | ICD-10-CM | POA: Insufficient documentation

## 2012-10-05 DIAGNOSIS — X500XXA Overexertion from strenuous movement or load, initial encounter: Secondary | ICD-10-CM | POA: Insufficient documentation

## 2012-10-05 DIAGNOSIS — Z8719 Personal history of other diseases of the digestive system: Secondary | ICD-10-CM | POA: Insufficient documentation

## 2012-10-05 MED ORDER — DIAZEPAM 5 MG PO TABS
5.0000 mg | ORAL_TABLET | Freq: Once | ORAL | Status: AC
  Administered 2012-10-05: 5 mg via ORAL
  Filled 2012-10-05: qty 1

## 2012-10-05 MED ORDER — DIAZEPAM 5 MG PO TABS: 5.0000 mg | ORAL_TABLET | Freq: Two times a day (BID) | ORAL | Status: DC

## 2012-10-05 MED ORDER — ACETAMINOPHEN 325 MG PO TABS: 650.0000 mg | ORAL_TABLET | Freq: Once | ORAL | Status: DC

## 2012-10-05 NOTE — ED Notes (Signed)
Pt refusing ace wrap. sts she has one at home

## 2012-10-05 NOTE — ED Provider Notes (Signed)
CSN: [id number]     Arrival date & time 10/05/12  1943 History  This chart was scribed for non-physician practitioner, Irish Elders, NP working with Dagmar Hait, MD by Greggory Stallion, ED scribe. This patient was seen in room TR06C/TR06C and the patient's care was started at 9:15 PM.   Chief Complaint  Patient presents with  . Foot Pain   The history is provided by the patient. No language interpreter was used.   HPI Comments: Savannah Taylor is a 57 y.o. female who presents to the Emergency Department complaining of right foot injury that occurred a few hours ago. She states she fell and twisted her foot. Pt denies any ankle pain. She now has sudden onset, constant foot pain. Certain movements and palpation worsen the pain. Pt denies any other associated symptoms.   Past Medical History  Diagnosis Date  . Migraine   . Back problem     BAD DISK IN BACK  . Hypertension   . Hyperlipidemia   . Knee problem   . Abdominal cramps   . Leg cramps   . Ulcer     STOMACH & SORENESS & PAIN  . Asthma   . Stroke   . UTI (urinary tract infection) 07/02/2012  . H/O hiatal hernia   . Shortness of breath    Past Surgical History  Procedure Laterality Date  . Abdominal hysterectomy    . Hemorrhoid surgery     Family History  Problem Relation Age of Onset  . Hypertension Mother   . Hyperlipidemia Mother   . Hypertension Brother   . Asthma Father   . Cancer Father    History  Substance Use Topics  . Smoking status: Never Smoker   . Smokeless tobacco: Never Used  . Alcohol Use: No   OB History   Grav Para Term Preterm Abortions TAB SAB Ect Mult Living                 Review of Systems  Musculoskeletal: Positive for arthralgias.  Neurological: Negative for numbness.  All other systems reviewed and are negative.    Allergies  Bystolic; Morphine and related; and Naproxen  Home Medications   Current Outpatient Rx  Name  Route  Sig  Dispense  Refill  . Biotin 5000 MCG  TABS   Oral   Take 10,000 mcg by mouth daily.          . carvedilol (COREG) 25 MG tablet   Oral   Take 25 mg by mouth 2 (two) times daily with a meal.         . chlorproMAZINE (THORAZINE) 25 MG tablet   Oral   Take 25 mg by mouth daily. Can take up to 3 times per 30 days         . ciprofloxacin (CIPRO) 500 MG tablet   Oral   Take 1 tablet (500 mg total) by mouth 2 (two) times daily.   10 tablet   0   . Cyanocobalamin (VITAMIN B-12 IJ)   Injection   Inject 1,000 mcg as directed every 30 (thirty) days.          . cyclobenzaprine (FLEXERIL) 10 MG tablet   Oral   Take 10 mg by mouth 3 (three) times daily as needed for muscle spasms.          . diazepam (VALIUM) 5 MG tablet   Oral   Take 1 tablet (5 mg total) by mouth every 6 (six) hours as  needed (muscle spasms).   10 tablet   0   . ezetimibe (ZETIA) 10 MG tablet   Oral   Take 10 mg by mouth daily.         Marland Kitchen oxyCODONE-acetaminophen (PERCOCET/ROXICET) 5-325 MG per tablet   Oral   Take 1 tablet by mouth every 6 (six) hours as needed for pain.   10 tablet   0   . pantoprazole (PROTONIX) 40 MG tablet   Oral   Take 40 mg by mouth daily before breakfast.         . traMADol (ULTRAM) 50 MG tablet   Oral   Take 50 mg by mouth every 6 (six) hours as needed for pain.         Marland Kitchen trolamine salicylate (ASPERCREME) 10 % cream   Topical   Apply 1 application topically daily as needed (for leg muscle pain).          BP 126/87  Pulse 88  Temp(Src) 98.8 F (37.1 C) (Oral)  Resp 16  SpO2 100%  Physical Exam  Nursing note and vitals reviewed. Constitutional: She is oriented to person, place, and time. She appears well-developed and well-nourished. No distress.  HENT:  Head: Normocephalic and atraumatic.  Eyes: EOM are normal.  Neck: Neck supple. No tracheal deviation present.  Cardiovascular: Normal rate.   Pulmonary/Chest: Effort normal. No respiratory distress.  Musculoskeletal: Normal range of motion.   Mild soft tissue swelling on top of her foot in close proximity to her ankle. Tender to touch. Pulses intact. Limited ROM due to pain.   Neurological: She is alert and oriented to person, place, and time.  Sensation intact.  Skin: Skin is warm and dry.  Psychiatric: She has a normal mood and affect. Her behavior is normal.    ED Course  Procedures (including critical care time)  DIAGNOSTIC STUDIES: Oxygen Saturation is 100% on RA, normal by my interpretation.    COORDINATION OF CARE: 9:19 PM-Discussed treatment plan which includes ACE wrap, pain medication, ice, elevation and rest with pt at bedside and pt agreed to plan.   Labs Review Labs Reviewed - No data to display Imaging Review Dg Foot Complete Right  10/05/2012   CLINICAL DATA:  Foot pain following injury  EXAM: RIGHT FOOT COMPLETE - 3+ VIEW  COMPARISON:  None.  FINDINGS: Mild hallux valgus deformity is noted. No acute fracture or dislocation is noted. No gross soft tissue abnormality is seen.  IMPRESSION: No acute abnormality noted.   Electronically Signed   By: Alcide Clever M.D.   On: 10/05/2012 20:30    MDM   1. Soft tissue injury   2. Sprain      X-ray: no acute deformity. Soft tissue injury and simple sprain. Recommended R.I.C.E. And ibuprofen as directed. ACE wrap applied for support.  I personally performed the services described in this documentation, which was scribed in my presence. The recorded information has been reviewed and is accurate.       Irish Elders, NP 10/05/12 2205

## 2012-10-05 NOTE — ED Notes (Addendum)
Pt states she tried to catch someone at church when they were falling and twisted her foot.  Pt able to move all toes.

## 2012-10-05 NOTE — ED Provider Notes (Signed)
Medical screening examination/treatment/procedure(s) were performed by non-physician practitioner and as supervising physician I was immediately available for consultation/collaboration.   Dagmar Hait, MD 10/05/12 [phone]

## 2012-11-06 ENCOUNTER — Other Ambulatory Visit: Payer: Self-pay

## 2013-06-18 ENCOUNTER — Emergency Department (HOSPITAL_COMMUNITY)
Admission: EM | Admit: 2013-06-18 | Discharge: 2013-06-18 | Discharge status: Against Medical Advice | Payer: Medicare Other | Attending: Emergency Medicine | Admitting: Emergency Medicine

## 2013-06-18 ENCOUNTER — Encounter (HOSPITAL_COMMUNITY): Payer: Self-pay | Admitting: Emergency Medicine

## 2013-06-18 DIAGNOSIS — I10 Essential (primary) hypertension: Secondary | ICD-10-CM | POA: Insufficient documentation

## 2013-06-18 DIAGNOSIS — R1011 Right upper quadrant pain: Secondary | ICD-10-CM | POA: Insufficient documentation

## 2013-06-18 DIAGNOSIS — R110 Nausea: Secondary | ICD-10-CM | POA: Insufficient documentation

## 2013-06-18 DIAGNOSIS — J45909 Unspecified asthma, uncomplicated: Secondary | ICD-10-CM | POA: Insufficient documentation

## 2013-06-18 LAB — CBC WITH DIFFERENTIAL/PLATELET
BASOS ABS: 0 10*3/uL (ref 0.0–0.1)
BASOS PCT: 0 % (ref 0–1)
EOS PCT: 2 % (ref 0–5)
Eosinophils Absolute: 0.1 10*3/uL (ref 0.0–0.7)
HCT: 42.8 % (ref 36.0–46.0)
Hemoglobin: 14.7 g/dL (ref 12.0–15.0)
LYMPHS PCT: 51 % — AB (ref 12–46)
Lymphs Abs: 2.6 10*3/uL (ref 0.7–4.0)
MCH: 30.7 pg (ref 26.0–34.0)
MCHC: 34.3 g/dL (ref 30.0–36.0)
MCV: 89.4 fL (ref 78.0–100.0)
MONO ABS: 0.2 10*3/uL (ref 0.1–1.0)
Monocytes Relative: 4 % (ref 3–12)
NEUTROS ABS: 2.2 10*3/uL (ref 1.7–7.7)
Neutrophils Relative %: 43 % (ref 43–77)
PLATELETS: 263 10*3/uL (ref 150–400)
RBC: 4.79 MIL/uL (ref 3.87–5.11)
RDW: 13.9 % (ref 11.5–15.5)
WBC: 5.1 10*3/uL (ref 4.0–10.5)

## 2013-06-18 LAB — COMPREHENSIVE METABOLIC PANEL
ALK PHOS: 83 U/L (ref 39–117)
ALT: 10 U/L (ref 0–35)
AST: 20 U/L (ref 0–37)
Albumin: 4 g/dL (ref 3.5–5.2)
BILIRUBIN TOTAL: 0.3 mg/dL (ref 0.3–1.2)
BUN: 11 mg/dL (ref 6–23)
CHLORIDE: 101 meq/L (ref 96–112)
CO2: 29 meq/L (ref 19–32)
Calcium: 9.9 mg/dL (ref 8.4–10.5)
Creatinine, Ser: 1.14 mg/dL — ABNORMAL HIGH (ref 0.50–1.10)
GFR calc non Af Amer: 52 mL/min — ABNORMAL LOW (ref >90)
GFR, EST AFRICAN AMERICAN: 61 mL/min — AB (ref >90)
GLUCOSE: 98 mg/dL (ref 70–99)
POTASSIUM: 3.4 meq/L — AB (ref 3.7–5.3)
SODIUM: 142 meq/L (ref 137–147)
Total Protein: 7.8 g/dL (ref 6.0–8.3)

## 2013-06-18 LAB — LIPASE, BLOOD: LIPASE: 24 U/L (ref 11–59)

## 2013-06-18 NOTE — ED Notes (Signed)
Family states they do not want to wait anymore to seek care.

## 2013-06-18 NOTE — ED Notes (Signed)
Pt c/o right upper abd pain, also c/o nausea but no vomiting.

## 2013-10-07 ENCOUNTER — Other Ambulatory Visit: Payer: Self-pay | Admitting: Gastroenterology

## 2013-10-07 DIAGNOSIS — R1013 Epigastric pain: Secondary | ICD-10-CM

## 2013-10-22 ENCOUNTER — Ambulatory Visit
Admission: RE | Admit: 2013-10-22 | Discharge: 2013-10-22 | Disposition: A | Discharge status: Home / Self Care | Payer: Medicare Other | Source: Ambulatory Visit | Attending: Gastroenterology | Admitting: Gastroenterology

## 2013-10-22 DIAGNOSIS — R1013 Epigastric pain: Secondary | ICD-10-CM

## 2013-10-22 MED ORDER — IOHEXOL 300 MG/ML  SOLN
100.0000 mL | Freq: Once | INTRAMUSCULAR | Status: AC | PRN
  Administered 2013-10-22: 100 mL via INTRAVENOUS

## 2014-02-25 ENCOUNTER — Other Ambulatory Visit: Payer: Self-pay | Admitting: Family Medicine

## 2014-02-25 ENCOUNTER — Ambulatory Visit
Admission: RE | Admit: 2014-02-25 | Discharge: 2014-02-25 | Disposition: A | Discharge status: Home / Self Care | Payer: Medicare Other | Source: Ambulatory Visit | Attending: Family Medicine | Admitting: Family Medicine

## 2014-02-25 DIAGNOSIS — R053 Chronic cough: Secondary | ICD-10-CM

## 2014-02-25 DIAGNOSIS — R062 Wheezing: Secondary | ICD-10-CM

## 2014-02-25 DIAGNOSIS — R0609 Other forms of dyspnea: Secondary | ICD-10-CM

## 2014-02-25 DIAGNOSIS — R05 Cough: Secondary | ICD-10-CM

## 2014-03-09 ENCOUNTER — Ambulatory Visit (HOSPITAL_COMMUNITY): Payer: Medicare Other | Attending: Cardiology | Admitting: Cardiology

## 2014-03-09 ENCOUNTER — Other Ambulatory Visit (HOSPITAL_COMMUNITY): Payer: Self-pay | Admitting: Family Medicine

## 2014-03-09 DIAGNOSIS — R0600 Dyspnea, unspecified: Secondary | ICD-10-CM | POA: Diagnosis not present

## 2014-03-09 NOTE — Progress Notes (Signed)
Echo performed. 

## 2014-06-10 ENCOUNTER — Ambulatory Visit: Payer: Medicare Other | Admitting: Internal Medicine

## 2015-02-22 ENCOUNTER — Other Ambulatory Visit: Payer: Self-pay | Admitting: Family Medicine

## 2015-02-22 DIAGNOSIS — Z1231 Encounter for screening mammogram for malignant neoplasm of breast: Secondary | ICD-10-CM

## 2015-03-09 ENCOUNTER — Ambulatory Visit: Payer: Medicare Other

## 2015-03-22 ENCOUNTER — Ambulatory Visit
Admission: RE | Admit: 2015-03-22 | Discharge: 2015-03-22 | Disposition: A | Discharge status: Home / Self Care | Payer: Medicare Other | Source: Ambulatory Visit | Attending: Family Medicine | Admitting: Family Medicine

## 2015-03-22 DIAGNOSIS — Z1231 Encounter for screening mammogram for malignant neoplasm of breast: Secondary | ICD-10-CM

## 2015-07-13 ENCOUNTER — Other Ambulatory Visit: Payer: Self-pay | Admitting: Family Medicine

## 2015-07-13 ENCOUNTER — Ambulatory Visit (INDEPENDENT_AMBULATORY_CARE_PROVIDER_SITE_OTHER): Payer: Medicare Other | Admitting: Internal Medicine

## 2015-07-13 DIAGNOSIS — R0600 Dyspnea, unspecified: Secondary | ICD-10-CM | POA: Diagnosis not present

## 2015-07-13 LAB — PULMONARY FUNCTION TEST
DL/VA % pred: 88 %
DL/VA: 4.44 ml/min/mmHg/L
DLCO UNC % PRED: 69 %
DLCO cor % pred: 69 %
DLCO cor: 18.71 ml/min/mmHg
DLCO unc: 18.6 ml/min/mmHg
FEF 25-75 PRE: 2.23 L/s
FEF 25-75 Post: 3.4 L/sec
FEF2575-%Change-Post: 52 %
FEF2575-%PRED-POST: 151 %
FEF2575-%Pred-Pre: 99 %
FEV1-%CHANGE-POST: 11 %
FEV1-%Pred-Post: 98 %
FEV1-%Pred-Pre: 88 %
FEV1-Post: 2.25 L
FEV1-Pre: 2.02 L
FEV1FVC-%Change-Post: 2 %
FEV1FVC-%Pred-Pre: 104 %
FEV6-%Change-Post: 8 %
FEV6-%PRED-POST: 93 %
FEV6-%Pred-Pre: 86 %
FEV6-POST: 2.61 L
FEV6-PRE: 2.42 L
FEV6FVC-%PRED-PRE: 103 %
FEV6FVC-%Pred-Post: 103 %
FVC-%CHANGE-POST: 8 %
FVC-%PRED-PRE: 83 %
FVC-%Pred-Post: 89 %
FVC-POST: 2.61 L
FVC-PRE: 2.42 L
PRE FEV6/FVC RATIO: 100 %
Post FEV1/FVC ratio: 86 %
Post FEV6/FVC ratio: 100 %
Pre FEV1/FVC ratio: 84 %
RV % PRED: 77 %
RV: 1.61 L
TLC % pred: 82 %
TLC: 4.39 L

## 2015-07-13 NOTE — Progress Notes (Signed)
PFT done today. 

## 2015-08-16 DIAGNOSIS — G43909 Migraine, unspecified, not intractable, without status migrainosus: Secondary | ICD-10-CM | POA: Insufficient documentation

## 2015-08-16 DIAGNOSIS — J309 Allergic rhinitis, unspecified: Secondary | ICD-10-CM | POA: Insufficient documentation

## 2015-08-19 ENCOUNTER — Other Ambulatory Visit (INDEPENDENT_AMBULATORY_CARE_PROVIDER_SITE_OTHER): Payer: Medicare Other

## 2015-08-19 ENCOUNTER — Ambulatory Visit (INDEPENDENT_AMBULATORY_CARE_PROVIDER_SITE_OTHER): Payer: Medicare Other | Admitting: Internal Medicine

## 2015-08-19 ENCOUNTER — Telehealth: Payer: Self-pay | Admitting: Internal Medicine

## 2015-08-19 VITALS — BP 132/74 | HR 85 | Ht 65.0 in | Wt 217.0 lb

## 2015-08-19 DIAGNOSIS — J45991 Cough variant asthma: Secondary | ICD-10-CM

## 2015-08-19 LAB — CBC WITH DIFFERENTIAL/PLATELET
BASOS ABS: 0 10*3/uL (ref 0.0–0.1)
BASOS PCT: 0.7 % (ref 0.0–3.0)
EOS PCT: 7.9 % — AB (ref 0.0–5.0)
Eosinophils Absolute: 0.4 10*3/uL (ref 0.0–0.7)
HEMATOCRIT: 36.1 % (ref 36.0–46.0)
Hemoglobin: 12.1 g/dL (ref 12.0–15.0)
LYMPHS ABS: 2.2 10*3/uL (ref 0.7–4.0)
LYMPHS PCT: 41.8 % (ref 12.0–46.0)
MCHC: 33.4 g/dL (ref 30.0–36.0)
MCV: 89.6 fl (ref 78.0–100.0)
MONOS PCT: 7.9 % (ref 3.0–12.0)
Monocytes Absolute: 0.4 10*3/uL (ref 0.1–1.0)
NEUTROS ABS: 2.2 10*3/uL (ref 1.4–7.7)
Neutrophils Relative %: 41.7 % — ABNORMAL LOW (ref 43.0–77.0)
PLATELETS: 260 10*3/uL (ref 150.0–400.0)
RBC: 4.03 Mil/uL (ref 3.87–5.11)
RDW: 14.9 % (ref 11.5–15.5)
WBC: 5.4 10*3/uL (ref 4.0–10.5)

## 2015-08-19 LAB — NITRIC OXIDE: Nitric Oxide: 21

## 2015-08-19 MED ORDER — MOMETASONE FURO-FORMOTEROL FUM 100-5 MCG/ACT IN AERO: 2.0000 | INHALATION_SPRAY | Freq: Two times a day (BID) | RESPIRATORY_TRACT | 0 refills | Status: DC

## 2015-08-19 MED ORDER — FAMOTIDINE 20 MG PO TABS: ORAL_TABLET | ORAL | 2 refills | Status: DC

## 2015-08-19 MED ORDER — PANTOPRAZOLE SODIUM 40 MG PO TBEC: 40.0000 mg | DELAYED_RELEASE_TABLET | Freq: Every day | ORAL | 2 refills | Status: DC

## 2015-08-19 NOTE — Patient Instructions (Addendum)
Pantoprazole (protonix) 40 mg   Take  30-60 min before first meal of the day and Pepcid (famotidine)  20 mg one @  bedtime until return to office - this is the best way to tell whether stomach acid is contributing to your problem.    GERD (REFLUX)  is an extremely common cause of respiratory symptoms just like yours , many times with no obvious heartburn at all.    It can be treated with medication, but also with lifestyle changes including elevation of the head of your bed (ideally with 6 inch  bed blocks),  Smoking cessation, avoidance of late meals, excessive alcohol, and avoid fatty foods, chocolate, peppermint, colas, red wine, and acidic juices such as orange juice.  NO MINT OR MENTHOL PRODUCTS SO NO COUGH DROPS   USE SUGARLESS CANDY INSTEAD (Jolley ranchers or Stover's or Life Savers) or even ice chips will also do - the key is to swallow to prevent all throat clearing. NO OIL BASED VITAMINS - use powdered substitutes.    Plan A = Automatic = Dulera 100 Take 2 puffs first thing in am and then another 2 puffs about 12 hours later.    Work on inhaler technique:  relax and gently blow all the way out then take a nice smooth deep breath back in, triggering the inhaler at same time you start breathing in.  Hold for up to 5 seconds if you can. Blow out thru nose. Rinse and gargle with water when done   Plan B = Backup Only use your albuterol as a rescue medication to be used if you can't catch your breath by resting or doing a relaxed purse lip breathing pattern.  - The less you use it, the better it will work when you need it. - Ok to use the inhaler up to 2 puffs  every 4 hours if you must but call for appointment if use goes up over your usual need - Don't leave home without it !!  (think of it like the spare tire for your car)    Please remember to go to the lab  department downstairs for your tests - we will call you with the results when they are available.  Please schedule a follow up  office visit in 4 weeks, sooner if needed - bring all meds/ inhalers

## 2015-08-19 NOTE — Progress Notes (Signed)
Subjective:    Patient ID: Savannah Taylor, female    DOB: 1955-09-22, 60 y.o.   MRN: [id number]  HPI   32  yobf former ward secretary at [employer] never smoker some sinus problems with hay fever but never sob outgrew at age 30's until around 2008 onset of sob with  wheezing referred 09/04/2011 to Pulmonary clinic by Dr Marliss Czar.  W/u  - PFT's 08/03/11 FEV1 2.77 (90%) ratio 79 no change p B2, DLCO 71 corrects to 107    - Trial off coreg > bystolic 20 mg starting [id number] no better    - 09/04/2011   Walked RA x one lap @ 185 stopped due to  Sob, no desat   -  75% hfa p coaching 10/11/2011    History of Present Illness  09/04/2011 1st pulmonary eval cc variable sob but always when exerts like one flight of steps and ventolin seemed to helped the most, resting did not always relieve it but never comes on at rest. Also with exposure to paint fumes.. Takes nexium x 3-4 months s benefit but coughs/ wheezes when singing  rec bystolic 20 mg one daily ok to take twice daily if needed nexium 40 mg Take 30-60 min before first meal of the day  GERD diet    10/11/2011 f/u ov/Savannah Taylor could not tolerate bystolic due to ha but no change on breathing p a week so went back on coreg. No longer using ventolin "wheezing" resolves with or without ventolin. Adherent with gerd rx.   rec Dulera 100 Take 2 puffs first thing in am and then another 2 puffs about 12 hours later.  Work on inhaler technique:     12/03/2011 NP ? Worse off dulera  Restart Dulera 100 Take 2 puffs first thing in am and then another 2 puffs about 12 hours later F/u in 2 m > did not do because of husband's stroke     08/19/2015  Consultation/ Savannah Taylor re prob cough variant asthma  Chief Complaint  Patient presents with  . Advice Only    referred by Dr. Justin Mend for SOB.  l/s 2013.    extremely confusing hx, being seen by multiple providers not in epic each telling her something different and hard to get her symptoms as dwells on various  non-specific dx's like "bronchitis" and "pulmonary artery narrowing" but  Def  Gradually worse cough /sob off albuterol and symbicort but not really better on it either (and not clear whether she was taking symbicort when she underwent last set of pfts ) with symptoms dating back to 2013  No obvious patterns in day to day or daytime variability or assoc excess/ purulent sputum or mucus plugs or hemoptysis or cp or   subjective wheeze or overt sinus or hb symptoms. No unusual exp hx or h/o childhood pna/ asthma or knowledge of premature birth.  usually sleeping ok without nocturnal  or early am exacerbation  of respiratory  c/o's or need for noct saba. Also denies any obvious fluctuation of symptoms with weather or environmental changes or other aggravating or alleviating factors except as outlined above   Current Medications, Allergies, Complete Past Medical History, Past Surgical History, Family History, and Social History were reviewed in [employer] record.     Review of Systems  Constitutional: Negative for fever and unexpected weight change.  HENT: Positive for congestion. Negative for dental problem, ear pain, nosebleeds, postnasal drip, rhinorrhea, sinus pressure, sneezing, sore throat and trouble swallowing.  Eyes: Negative for redness and itching.  Respiratory: Positive for cough, chest tightness and shortness of breath. Negative for wheezing.   Cardiovascular: Negative for palpitations and leg swelling.  Gastrointestinal: Negative for nausea and vomiting.  Genitourinary: Negative for dysuria.  Musculoskeletal: Negative for joint swelling.  Skin: Negative for rash.  Neurological: Negative for headaches.  Hematological: Does not bruise/bleed easily.  Psychiatric/Behavioral: Negative for dysphoric mood. The patient is not nervous/anxious.        Objective:   Physical Exam amb obese bf nad  Wt Readings from Last 3 Encounters:  08/19/15 217 lb (98.4 kg)    07/02/12 196 lb 3.4 oz (89 kg)  12/03/11 214 lb 4.8 oz (97.2 kg)    Vital signs reviewed  HEENT: nl dentition, turbinates, and orophanx. Nl external ear canals without cough reflex   NECK :  without JVD/Nodes/TM/ nl carotid upstrokes bilaterally   LUNGS: no acc muscle use, clear to A and P bilaterally with cough at end exp and variable wheezing improves but doesn't resolve with purse lip maneuver    CV:  RRR  no s3 or murmur or increase in P2, no edema   ABD:  soft and nontender with nl excursion in the supine position. No bruits or organomegaly, bowel sounds nl  MS:  warm without deformities, calf tenderness, cyanosis or clubbing  SKIN: warm and dry without lesions    NEURO:  alert, approp, no deficits   Labs ordered 08/19/2015 :  Cbc with diff/ eos         Assessment & Plan:

## 2015-08-19 NOTE — Telephone Encounter (Signed)
[facility] Nita, at [place], office is closed.  Wcb.

## 2015-08-19 NOTE — Assessment & Plan Note (Addendum)
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 as well   DDX of  difficult airways management almost all start with A and  include Adherence, Ace Inhibitors, Acid Reflux, Active Sinus Disease, Alpha 1 Antitripsin deficiency, Anxiety masquerading as Airways dz,  ABPA,  Allergy(esp in young), Aspiration (esp in elderly), Adverse effects of meds,  Active smokers, A bunch of PE's (a small clot burden can't cause this syndrome unless there is already severe underlying pulm or vascular dz with poor reserve) plus two Bs  = Bronchiectasis and Beta blocker use..and one C= CHF   Adherence is always the initial "prime suspect" and is a multilayered concern that requires a "trust but verify" approach in every patient - starting with knowing how to use medications, especially inhalers, correctly, keeping up with refills and understanding the fundamental difference between maintenance and prns vs those medications only taken for a very short course and then stopped and not refilled.  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 Could she now have cognitive impairment?)  - - The proper method of use, as well as anticipated side effects, of a metered-dose inhaler are discussed and demonstrated to the patient. Improved effectiveness after extensive coaching during this visit to a level of approximately 75 % from a baseline of 50 % > dulera rechallenge using 100 2bid x 4 week and f/u then with all meds to use a trust but verify approach here before going further with empirical trials ie MCT may be needed       ? Acid (or non-acid) GERD >  always difficult to exclude as up to 75% of pts in some series report no assoc GI/ Heartburn symptoms> rec max (24h)  acid suppression and diet restrictions/ reviewed and instructions given in writing.   ? Allergy component > FENO  21 rules against but needs allergy profile to complete w/u   ? Anxiety > usually at the bottom of this list of usual suspects but should be much higher on this pt's based on Hx (absence during sleep)  and P and note already tried  on psychotropics .   Total time devoted to counseling  = 35/57m review case with pt/ discussion of options/alternatives/ personally creating written instructions  in presence of pt  then going over those specific  Instructions directly with the pt including how to use all of the meds but in particular covering each new medication in detail and the difference between the maintenance/automatic meds and the prns using an action plan format for the latter.

## 2015-08-20 ENCOUNTER — Encounter: Payer: Self-pay | Admitting: Internal Medicine

## 2015-08-22 LAB — RESPIRATORY ALLERGY PROFILE REGION II ~~LOC~~
Allergen, A. alternata, m6: <0.1 kU/L
Allergen, C. Herbarum, M2: <0.1 kU/L
Allergen, Comm Silver Birch, t9: <0.1 kU/L
Allergen, D pternoyssinus,d7: 0.19 kU/L — ABNORMAL HIGH
Allergen, Mulberry, t76: <0.1 kU/L
Allergen, Oak,t7: <0.1 kU/L
Box Elder IgE: <0.1 kU/L
Cockroach: 2.2 kU/L — ABNORMAL HIGH
D. farinae: <0.1 kU/L
DOG DANDER: 27.7 kU/L — AB
IgE (Immunoglobulin E), Serum: 595 kU/L — ABNORMAL HIGH (ref <115)
Johnson Grass: <0.1 kU/L
Rough Pigweed  IgE: <0.1 kU/L
Sheep Sorrel IgE: <0.1 kU/L

## 2015-08-23 ENCOUNTER — Telehealth: Payer: Self-pay | Admitting: Internal Medicine

## 2015-08-23 NOTE — Telephone Encounter (Signed)
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

## 2015-08-31 ENCOUNTER — Ambulatory Visit (INDEPENDENT_AMBULATORY_CARE_PROVIDER_SITE_OTHER): Payer: Medicare Other | Admitting: Neurology

## 2015-08-31 ENCOUNTER — Encounter: Payer: Self-pay | Admitting: Neurology

## 2015-08-31 VITALS — BP 125/88 | HR 85 | Ht 65.0 in | Wt 210.0 lb

## 2015-08-31 DIAGNOSIS — R51 Headache: Secondary | ICD-10-CM

## 2015-08-31 DIAGNOSIS — G43709 Chronic migraine without aura, not intractable, without status migrainosus: Secondary | ICD-10-CM | POA: Diagnosis not present

## 2015-08-31 DIAGNOSIS — N189 Chronic kidney disease, unspecified: Secondary | ICD-10-CM | POA: Diagnosis not present

## 2015-08-31 DIAGNOSIS — R519 Headache, unspecified: Secondary | ICD-10-CM

## 2015-08-31 MED ORDER — TOPIRAMATE ER 50 MG PO CAP24: 100.0000 mg | ORAL_CAPSULE | Freq: Every day | ORAL | 12 refills | Status: DC

## 2015-08-31 NOTE — Patient Instructions (Addendum)
Remember to drink plenty of fluid, eat healthy meals and do not skip any meals. Try to eat protein with a every meal and eat a healthy snack such as fruit or nuts in between meals. Try to keep a regular sleep-wake schedule and try to exercise daily, particularly in the form of walking, 20-30 minutes a day, if you can.   As far as your medications are concerned, I would like to suggest: Trokendi. Start with 50mg  at night for 2 weeks then increase to 100mg  at night.  As far as diagnostic testing: Labs  Our phone number is [phone]. We also have an after hours call service for urgent matters and there is a physician on-call for urgent questions. For any emergencies you know to call 911 or go to the nearest emergency room  Topiramate extended-release capsules What is this medicine? TOPIRAMATE (toe PYRE a mate) is used to treat seizures in adults or children with epilepsy. This medicine may be used for other purposes; ask your health care provider or pharmacist if you have questions. What should I tell my health care provider before I take this medicine? They need to know if you have any of these conditions: -cirrhosis of the liver or liver disease -diarrhea -glaucoma -kidney stones or kidney disease -lung disease like asthma, obstructive pulmonary disease, emphysema -metabolic acidosis -on a ketogenic diet -scheduled for surgery or a procedure -suicidal thoughts, plans, or attempt; a previous suicide attempt by you or a family member -an unusual or allergic reaction to topiramate, other medicines, foods, dyes, or preservatives -pregnant or trying to get pregnant -breast-feeding How should I use this medicine? Take this medicine by mouth with a glass of water. Follow the directions on the prescription label. Trokendi XR capsules must be swallowed whole. Do not sprinkle on food, break, crush, dissolve, or chew. Qudexy XR capsules may be swallowed whole or opened and sprinkled on a small  amount of soft food. This mixture must be swallowed immediately. Do not chew or store mixture for later use. You may take this medicine with meals. Take your medicine at regular intervals. Do not take it more often than directed. Talk to your pediatrician regarding the use of this medicine in children. Special care may be needed. While Trokendi XR may be prescribed for children as young as 6 years and Qudexy XR may be prescribed for children as young as 2 years for selected conditions, precautions do apply. Overdosage: If you think you have taken too much of this medicine contact a poison control center or emergency room at once. NOTE: This medicine is only for you. Do not share this medicine with others. What if I miss a dose? If you miss a dose, take it as soon as you can. If it is almost time for your next dose, take only that dose. Do not take double or extra doses. What may interact with this medicine? Do not take this medicine with any of the following medications: -probenecid This medicine may also interact with the following medications: -acetazolamide -alcohol -amitriptyline -birth control pills -digoxin -hydrochlorothiazide -lithium -medicines for pain, sleep, or muscle relaxation -metformin -methazolamide -other seizure or epilepsy medicines -pioglitazone -risperidone This list may not describe all possible interactions. Give your health care provider a list of all the medicines, herbs, non-prescription drugs, or dietary supplements you use. Also tell them if you smoke, drink alcohol, or use illegal drugs. Some items may interact with your medicine. What should I watch for while using this medicine? Visit  your doctor or health care professional for regular checks on your progress. Do not stop taking this medicine suddenly. This increases the risk of seizures if you are using this medicine to control epilepsy. Wear a medical identification bracelet or chain to say you have epilepsy  or seizures, and carry a card that lists all your medicines. This medicine can decrease sweating and increase your body temperature. Watch for signs of deceased sweating or fever, especially in children. Avoid extreme heat, hot baths, and saunas. Be careful about exercising, especially in hot weather. Contact your health care provider right away if you notice a fever or decrease in sweating. You should drink plenty of fluids while taking this medicine. If you have had kidney stones in the past, this will help to reduce your chances of forming kidney stones. If you have stomach pain, with nausea or vomiting and yellowing of your eyes or skin, call your doctor immediately. You may get drowsy, dizzy, or have blurred vision. Do not drive, use machinery, or do anything that needs mental alertness until you know how this medicine affects you. To reduce dizziness, do not sit or stand up quickly, especially if you are an older patient. Alcohol can increase drowsiness and dizziness. Avoid alcoholic drinks. Do not drink alcohol for 6 hours before or 6 hours after taking Trokendi XR. If you notice blurred vision, eye pain, or other eye problems, seek medical attention at once for an eye exam. The use of this medicine may increase the chance of suicidal thoughts or actions. Pay special attention to how you are responding while on this medicine. Any worsening of mood, or thoughts of suicide or dying should be reported to your health care professional right away. This medicine may increase the chance of developing metabolic acidosis. If left untreated, this can cause kidney stones, bone disease, or slowed growth in children. Symptoms include breathing fast, fatigue, loss of appetite, irregular heartbeat, or loss of consciousness. Call your doctor immediately if you experience any of these side effects. Also, tell your doctor about any surgery you plan on having while taking this medicine since this may increase your risk  for metabolic acidosis. Birth control pills may not work properly while you are taking this medicine. Talk to your doctor about using an extra method of birth control. Women who become pregnant while using this medicine may enroll in the [place] Pregnancy Registry by calling [id number]. This registry collects information about the safety of antiepileptic drug use during pregnancy. What side effects may I notice from receiving this medicine? Side effects that you should report to your doctor or health care professional as soon as possible: -allergic reactions like skin rash, itching or hives, swelling of the face, lips, or tongue -decreased sweating and/or rise in body temperature -depression -difficulty breathing, fast or irregular breathing patterns -difficulty speaking -difficulty walking or controlling muscle movements -hearing impairment -redness, blistering, peeling or loosening of the skin, including inside the mouth -tingling, pain or numbness in the hands or feet -unusually weak or tired -worsening of mood, thoughts or actions of suicide or dying Side effects that usually do not require medical attention (Report these to your doctor or health care professional if they continue or are bothersome.): -altered taste -back pain, joint or muscle aches and pains -diarrhea, or constipation -headache -loss of appetite -nausea -stomach upset, indigestion -tremors This list may not describe all possible side effects. Call your doctor for medical advice about side effects. You may report  side effects to FDA at 1-800-FDA-1088. Where should I keep my medicine? Keep out of the reach of children. Store at room temperature between 15 and 30 degrees C (59 and 86 degrees F) in a tightly closed container. Protect from moisture. Throw away any unused medicine after the expiration date. NOTE: This sheet is a summary. It may not cover all possible information. If you have  questions about this medicine, talk to your doctor, pharmacist, or health care provider.    2016, Elsevier/Gold Standard. (2012-03-18 15:33:26)

## 2015-08-31 NOTE — Progress Notes (Signed)
[id number]    Provider:  Dr Jaynee Eagles Referring Provider: Maurice Small, MD Primary Care Physician:  Jonathon Bellows, MD  CC:  Migraines  HPI:  Savannah Taylor is a 60 y.o. female here as a referral from Dr. Justin Mend for migraines. Past medical history of chronic migraines, depression, hypertension, high cholesterol, B12 deficiency, chronic insomnia, chronic kidney disease, multilevel degenerative disease. She has been seeing Dr. Domingo Cocking for nerve injections. Migraines since a teenager without any inciting event or head trauma. She has a family history of migraines, Her son and paternal aunt have migraines. She has been having injections of the [facility], she went every month and then every 2 months (likely nerve block) but she also says that she received Botox which helped but insurance would not pay anymore. Now she just goes as needed. For nerve blocks. She has headaches 15 days a month. 8-10 are migrainous. This is been ongoing for the last 6 months. Her migraines feele like her head is swelling on one side, but they can move around, one eye droops, feels tight, she can hear her heart beating in her head especially when laying down, more pounding than pressure, she can't stand sound or noise. Dark rooms help. Being quiet helps. They can last for days straight. They can be severe with nausea and vomiting on average out of 10 can be 6-7/10. She has been to "so many doctors and so many places" and the doctors get frustrated because they can't help. She has been to headaches clinics. She was told she was faking in the past. She has tried everything per patient. No other associated symptoms or focal neurologic deficits or complaints. No aura. No medication overuse.   Tried: Topamax, verapamil, amitriptyline, depakote, inderal, she has had imitrex injections, maxalt, zomig. Neurontin, botox helped a lot but then her physician said insurance wouldn;t pay, effexor, cymbalta.    Reviewed notes, labs and imaging from outside physicians, which showed:  BUN 12 and creatinine 1.15 taken 04/05/2015. TSH 0.810 January 2017.  CT of the head 04/2012 showed No acute intracranial abnormalities including mass lesion or mass effect, hydrocephalus, extra-axial fluid collection, midline shift, hemorrhage, or acute infarction, large ischemic events (personally reviewed images)   Review of Systems: Patient complains of symptoms per HPI as well as the following symptoms: Weight gain, fatigue, cramps. Pertinent negatives per HPI. All others negative.   Social History   Social History  . Marital status: Married    Spouse name: [name]  . Number of children: 3  . Years of education: Some college   Occupational History  . works full time    Social History Main Topics  . Smoking status: Never Smoker  . Smokeless tobacco: Never Used  . Alcohol use No  . Drug use: No  . Sexual activity: Not on file   Other Topics Concern  . Not on file   Social History Narrative   Lives with husband   Caffeine use: Coffee/tea/soda sometimes per patient    Family History  Problem Relation Age of Onset  . Hypertension Mother   . Hyperlipidemia Mother   . Asthma Father   . Cancer Father   . Hypertension Brother     Past Medical History:  Diagnosis Date  . Abdominal cramps   . Asthma   . Back problem    BAD DISK IN BACK  . H/O hiatal hernia   . Hyperlipidemia   . Hypertension   . Knee problem   .  Leg cramps   . Migraine   . Shortness of breath   . Stroke ([place])   . Ulcer    STOMACH & SORENESS & PAIN  . UTI (urinary tract infection) 07/02/2012    Past Surgical History:  Procedure Laterality Date  . ABDOMINAL HYSTERECTOMY    . HEMORRHOID SURGERY      Current Outpatient Prescriptions  Medication Sig Dispense Refill  . AMLODIPINE-VALSARTAN-HCTZ PO Take 1 tablet by mouth daily.    . Cyanocobalamin (VITAMIN B-12 IJ) Inject 1,000 mcg as directed every 30 (thirty) days.      . cyclobenzaprine (FLEXERIL) 10 MG tablet Take 10 mg by mouth 3 (three) times daily as needed for muscle spasms.     . diazepam (VALIUM) 5 MG tablet Take 1 tablet (5 mg total) by mouth 2 (two) times daily. (Patient taking differently: Take 5 mg by mouth as needed. ) 10 tablet 0  . diclofenac (VOLTAREN) 75 MG EC tablet Take by mouth as needed.    . ezetimibe (ZETIA) 10 MG tablet Take 10 mg by mouth daily.    . famotidine (PEPCID) 20 MG tablet One at bedtime 30 tablet 2  . gabapentin (NEURONTIN) 600 MG tablet Take 600 mg by mouth 2 (two) times daily as needed.    . pantoprazole (PROTONIX) 40 MG tablet Take 1 tablet (40 mg total) by mouth daily. Take 30-60 min before first meal of the day 30 tablet 2  . traMADol (ULTRAM) 50 MG tablet Take 50 mg by mouth every 6 (six) hours as needed for pain.    Marland Kitchen trolamine salicylate (ASPERCREME) 10 % cream Apply 1 application topically daily as needed (for leg muscle pain).    . valsartan-hydrochlorothiazide (DIOVAN-HCT) 160-25 MG tablet Take 1 tablet by mouth daily.    . mometasone-formoterol (DULERA) 100-5 MCG/ACT AERO Inhale 2 puffs into the lungs 2 (two) times daily. 2 Inhaler 0  . Topiramate ER 50 MG CP24 Take 100 mg by mouth at bedtime. 60 capsule 12   No current facility-administered medications for this visit.     Allergies as of 08/31/2015 - Review Complete 08/20/2015  Allergen Reaction Noted  . Bystolic [nebivolol hcl] Palpitations and Other (See Comments) 10/11/2011  . Morphine and related Itching 10/05/2012  . Naproxen Hives and Itching 05/08/2010    Vitals: BP 125/88 (BP Location: Right Arm, Patient Position: Sitting, Cuff Size: Normal)   Pulse 85   Ht 5\' 5"  (1.651 m)   Wt 210 lb (95.3 kg)   SpO2 95%   BMI 34.95 kg/m  Last Weight:  Wt Readings from Last 1 Encounters:  08/31/15 210 lb (95.3 kg)   Last Height:   Ht Readings from Last 1 Encounters:  08/31/15 5\' 5"  (1.651 m)   Physical exam: Exam: Gen: NAD, conversant, well  nourised, obese, well groomed                     CV: RRR, no MRG. No Carotid Bruits. No peripheral edema, warm, nontender Eyes: Conjunctivae clear without exudates or hemorrhage  Neuro: Detailed Neurologic Exam  Speech:    Speech is normal; fluent and spontaneous with normal comprehension.  Cognition:    The patient is oriented to person, place, and time;     recent and remote memory intact;     language fluent;     normal attention, concentration,     fund of knowledge Cranial Nerves:    The pupils are equal, round, and reactive to light. The  fundi are normal and spontaneous venous pulsations are present. Visual fields are full to finger confrontation. Extraocular movements are intact. Trigeminal sensation is intact and the muscles of mastication are normal. The face is symmetric. The palate elevates in the midline. Hearing intact. Voice is normal. Shoulder shrug is normal. The tongue has normal motion without fasciculations.   Coordination:    Normal finger to nose and heel to shin. Normal rapid alternating movements.   Gait:    Heel-toe and tandem gait are normal.   Motor Observation:    No asymmetry, no atrophy, and no involuntary movements noted. Tone:    Normal muscle tone.    Posture:    Posture is normal. normal erect    Strength:    Strength is V/V in the upper and lower limbs.      Sensation: intact to LT     Reflex Exam:  DTR's:    Deep tendon reflexes in the upper and lower extremities are normal bilaterally.   Toes:    The toes are downgoing bilaterally.   Clonus:    Clonus is absent.       Assessment/Plan:  60 year old female with chronic migraines without status migrainosus without aura  As far as your medications are concerned, I would like to suggest: Trokendi. Start with 50mg  at night for 2 weeks then increase to 100mg  at night. Discussed side effects, as per patient instructions.  As far as diagnostic testing: Labs  Recommend Botox  injections. Botox for migraine.  Also discussed: To prevent or relieve headaches, try the following: Cool Compress. Lie down and place a cool compress on your head.  Avoid headache triggers. If certain foods or odors seem to have triggered your migraines in the past, avoid them. A headache diary might help you identify triggers.  Include physical activity in your daily routine. Try a daily walk or other moderate aerobic exercise.  Manage stress. Find healthy ways to cope with the stressors, such as delegating tasks on your to-do list.  Practice relaxation techniques. Try deep breathing, yoga, massage and visualization.  Eat regularly. Eating regularly scheduled meals and maintaining a healthy diet might help prevent headaches. Also, drink plenty of fluids.  Follow a regular sleep schedule. Sleep deprivation might contribute to headaches Consider biofeedback. With this mind-body technique, you learn to control certain bodily functions - such as muscle tension, heart rate and blood pressure - to prevent headaches or reduce headache pain.    Proceed to emergency room if you experience new or worsening symptoms or symptoms do not resolve, if you have new neurologic symptoms or if headache is severe, or for any concerning symptom.   Cc: Dr. Fabio Asa, MD  [facility] [address] [place], [place] [address]  Phone [phone] Fax [phone]

## 2015-09-01 ENCOUNTER — Other Ambulatory Visit: Payer: Self-pay | Admitting: Neurology

## 2015-09-01 ENCOUNTER — Telehealth: Payer: Self-pay | Admitting: Neurology

## 2015-09-01 ENCOUNTER — Telehealth: Payer: Self-pay | Admitting: *Deleted

## 2015-09-01 LAB — COMPREHENSIVE METABOLIC PANEL
ALT: 9 IU/L (ref 0–32)
AST: 14 IU/L (ref 0–40)
Albumin/Globulin Ratio: 1.6 (ref 1.2–2.2)
Albumin: 4.4 g/dL (ref 3.5–5.5)
Alkaline Phosphatase: 74 IU/L (ref 39–117)
BILIRUBIN TOTAL: 0.3 mg/dL (ref 0.0–1.2)
BUN/Creatinine Ratio: 12 (ref 9–23)
BUN: 12 mg/dL (ref 6–24)
CALCIUM: 9.6 mg/dL (ref 8.7–10.2)
CHLORIDE: 101 mmol/L (ref 96–106)
CO2: 26 mmol/L (ref 18–29)
Creatinine, Ser: 1.02 mg/dL — ABNORMAL HIGH (ref 0.57–1.00)
GFR calc non Af Amer: 60 mL/min/{1.73_m2} (ref >59)
GFR, EST AFRICAN AMERICAN: 70 mL/min/{1.73_m2} (ref >59)
GLUCOSE: 82 mg/dL (ref 65–99)
Globulin, Total: 2.7 g/dL (ref 1.5–4.5)
Potassium: 4.5 mmol/L (ref 3.5–5.2)
Sodium: 142 mmol/L (ref 134–144)
TOTAL PROTEIN: 7.1 g/dL (ref 6.0–8.5)

## 2015-09-01 LAB — CBC
HEMATOCRIT: 39.9 % (ref 34.0–46.6)
HEMOGLOBIN: 12.9 g/dL (ref 11.1–15.9)
MCH: 29.4 pg (ref 26.6–33.0)
MCHC: 32.3 g/dL (ref 31.5–35.7)
MCV: 91 fL (ref 79–97)
Platelets: 290 10*3/uL (ref 150–379)
RBC: 4.39 x10E6/uL (ref 3.77–5.28)
RDW: 15 % (ref 12.3–15.4)
WBC: 5 10*3/uL (ref 3.4–10.8)

## 2015-09-01 MED ORDER — TOPIRAMATE 50 MG PO TABS: ORAL_TABLET | ORAL | 12 refills | Status: DC

## 2015-09-01 NOTE — Telephone Encounter (Signed)
Dr Jaynee Eagles- can you call in generic topiramate? Thank you  Called patient. She stated she does not remember how she responded to the topiramate generic IR. She is willing to try this medication again. She found an old prescription that was for topiramate 25mg  1 po at bedtime. She was to increase by 25mg  each week until she reached 100mg . She does not remember if she responded well to medication or not.  Advised that it does take about 4-6 to reach max benefit. She is to call if she has any SE. She verbalized understanding.

## 2015-09-01 NOTE — Telephone Encounter (Signed)
Pt called she is at the pharmacy and has been told to the card to use with Topiramate ER 50 MG CP24 can not be used bc she has medicare. If the card is not used medicare does not cover it. Is there another medication she can take.

## 2015-09-01 NOTE — Telephone Encounter (Signed)
Dr Jaynee Eagles- can you call in topamax IR instead? Thank you

## 2015-09-01 NOTE — Telephone Encounter (Signed)
Called and spoke to pt about unremarkable labs per Dr Jaynee Eagles. Pt verbalized understanding.

## 2015-09-01 NOTE — Telephone Encounter (Signed)
-----   Message from Melvenia Beam, MD sent at 09/01/2015  6:07 PM EDT ----- Labs unremarkable thanks

## 2015-09-01 NOTE — Telephone Encounter (Signed)
We can try topiramate generic again (she has been on this in the past but I thought it did not work or she had side effects) or we can try another medication I recommend Nortriptyline (this is similar to amitriptyline but has less side effects)

## 2015-09-12 ENCOUNTER — Institutional Professional Consult (permissible substitution): Payer: Medicare Other | Admitting: Pulmonary Disease

## 2015-09-21 ENCOUNTER — Ambulatory Visit (INDEPENDENT_AMBULATORY_CARE_PROVIDER_SITE_OTHER): Payer: Medicare Other | Admitting: Internal Medicine

## 2015-09-21 ENCOUNTER — Encounter: Payer: Self-pay | Admitting: Internal Medicine

## 2015-09-21 VITALS — BP 102/66 | HR 90 | Ht 65.0 in | Wt 207.8 lb

## 2015-09-21 DIAGNOSIS — R0600 Dyspnea, unspecified: Secondary | ICD-10-CM

## 2015-09-21 DIAGNOSIS — J45991 Cough variant asthma: Secondary | ICD-10-CM

## 2015-09-21 DIAGNOSIS — R05 Cough: Secondary | ICD-10-CM

## 2015-09-21 DIAGNOSIS — R058 Other specified cough: Secondary | ICD-10-CM | POA: Insufficient documentation

## 2015-09-21 MED ORDER — GABAPENTIN 600 MG PO TABS: 600.0000 mg | ORAL_TABLET | Freq: Two times a day (BID) | ORAL | 1 refills | Status: DC

## 2015-09-21 NOTE — Progress Notes (Signed)
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  W/u  - PFT's 08/03/11 FEV1 2.77 (90%) ratio 79 no change p B2, DLCO 71 corrects to 107    - Trial off coreg > bystolic 20 mg starting Q000111Q no better    - 09/04/2011   Walked RA x one lap @ 185 stopped due to  Sob, no desat   -  75% hfa p coaching 10/11/2011    History of Present Illness  09/04/2011 1st pulmonary eval cc variable sob but always when exerts like one flight of steps and ventolin seemed to helped the most, resting did not always relieve it but never comes on at rest. Also with exposure to paint fumes.. Takes nexium x 3-4 months s benefit but coughs/ wheezes when singing  rec bystolic 20 mg one daily ok to take twice daily if needed nexium 40 mg Take 30-60 min before first meal of the day  GERD diet    10/11/2011 f/u ov/Nieves Barberi could not tolerate bystolic due to ha but no change on breathing p a week so went back on coreg. No longer using ventolin "wheezing" resolves with or without ventolin. Adherent with gerd rx.   rec Dulera 100 Take 2 puffs first thing in am and then another 2 puffs about 12 hours later.  Work on inhaler technique:     12/03/2011 NP ? Worse off dulera  Restart Dulera 100 Take 2 puffs first thing in am and then another 2 puffs about 12 hours later F/u in 2 m > did not do because of husband's stroke     08/19/2015  Consultation/ [place] Natarajan re prob cough variant asthma  Chief Complaint  Patient presents with  . Advice Only    referred by Dr. Justin Mend for SOB.  l/s 2013.    extremely confusing hx, being seen by multiple providers not in epic each telling her something different and hard to get her symptoms as  dwells on various non-specific dx's like "bronchitis" and "pulmonary artery narrowing" but  Def  Gradually worse cough /sob off albuterol and symbicort but not really better on it either (and not clear whether she was taking symbicort when she underwent last set of pfts ) with symptoms dating back to 2013 but present since 6 months rec Pantoprazole (protonix) 40 mg   Take  30-60 min before first meal of the day and Pepcid (famotidine)  20 mg one @  bedtime until return to office - this is the best way to tell whether stomach acid is contributing to your problem.   GERD  Diet   Plan A = Automatic = Dulera 100 Take 2 puffs first thing in am and then another 2 puffs about 12 hours later.  Work on Orthoptist B = Backup Only use your albuterol as a rescue medication Please remember to go to the lab  department downstairs for your tests - we will call you with the results when they are available. 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 Follow-up    Breathing has improved some. She states that she still feels like something is stuck in her chest when she takes in a breath.   still cough with breath in, still feel like something stuck in throat   Sleeping fine / no noct spells/ no need for saba  Only used one dulera 100 x 15 day sample since last ov   No obvious day to day or daytime variability or assoc excess/ purulent sputum or mucus plugs or hemoptysis or cp or chest tightness, subjective wheeze or overt sinus or hb symptoms. No unusual exp hx or h/o childhood pna/ asthma or knowledge of premature birth.  Sleeping ok without nocturnal  or early am exacerbation  of respiratory  c/o's or need for noct saba. Also denies any obvious fluctuation of symptoms with weather or environmental changes or other aggravating  or alleviating factors except as outlined above   Current Medications, Allergies, Complete Past Medical History, Past Surgical History, Family History, and Social History were reviewed in [employer] record.  ROS  The following are not active complaints unless bolded sore throat, dysphagia, dental problems, itching, sneezing,  nasal congestion or excess/ purulent secretions, ear ache,   fever, chills, sweats, unintended wt loss, classically pleuritic or exertional cp,  orthopnea pnd or leg swelling, presyncope, palpitations, abdominal pain, anorexia, nausea, vomiting, diarrhea  or change in bowel or bladder habits, change in stools or urine, dysuria,hematuria,  rash, arthralgias, visual complaints, headache, numbness, weakness or ataxia or problems with walking or coordination,  change in mood/affect or memory.             Objective:   Physical Exam   amb obese bf nad/ mild pseudowheeze   09/21/2015       207   08/19/15 217 lb (98.4 kg)  07/02/12 196 lb 3.4 oz (89 kg)  12/03/11 214 lb 4.8 oz (97.2 kg)    Vital signs reviewed  HEENT: nl dentition, turbinates, and orophanx. Nl external ear canals without cough reflex   NECK :  without JVD/Nodes/TM/ nl carotid upstrokes bilaterally   LUNGS: no acc muscle use, clear to A and P bilaterally with no longer any cough at end exp    CV:  RRR  no s3 or murmur or increase in P2, no edema   ABD:  soft and nontender with nl excursion in the supine position. No bruits or organomegaly, bowel sounds nl  MS:  warm without deformities, calf tenderness, cyanosis or clubbing  SKIN: warm and dry without lesions    NEURO:  alert, approp, no deficits            Assessment & Plan:

## 2015-09-21 NOTE — Assessment & Plan Note (Signed)
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   Upper airway cough syndrome (previously labeled PNDS) , is  so named because it's frequently impossible to sort out how much is  CR/sinusitis with freq throat clearing (which can be related to primary GERD)   vs  causing  secondary (" extra esophageal")  GERD from wide swings in gastric pressure that occur with throat clearing, often  promoting self use of mint and menthol lozenges that reduce the lower esophageal sphincter tone and exacerbate the problem further in a cyclical fashion.   These are the same pts (now being labeled as having "irritable larynx syndrome" by some cough centers) who not infrequently have a history of having failed to tolerate ace inhibitors,  dry powder inhalers or biphosphonates or report having atypical/extraesophageal reflux symptoms that don't respond to standard doses of PPI  and are easily confused as having aecopd or asthma flares by even experienced allergists/ pulmonologists (myself included).

## 2015-09-21 NOTE — Assessment & Plan Note (Signed)
PFTs 07/13/15  wnl  X  for minimal nonspecific decrease in dlco  FENO 08/19/2015  =  21 off all rx  08/19/2015    rechallenge with dulera 100 2bid x 4 week sample then ov  > only used one 15 day sample 09/21/2015  - Allergy profile 08/19/15 >  Eos 0.4 /  IgE  595 Pos RAST Dog >> cockroach > dust    - 09/21/2015  After extensive coaching HFA effectiveness =    90% so rechallenge with dulera 100 one bid    Very little to suggest asthma here with no noct symptoms and feno so low so rec taper dulera 100 : reduce to one bid (since can't have been on the 2 bid anyway given the use of 60 puffs over 30 days since last ov)  I had an extended discussion with the patient reviewing all relevant studies completed to date and  lasting 15 to 20 minutes of a 25 minute visit    Each maintenance medication was reviewed in detail including most importantly the difference between maintenance and prns and under what circumstances the prns are to be triggered using an action plan format that is not reflected in the computer generated alphabetically organized AVS.    Please see instructions for details which were reviewed in writing and the patient given a copy highlighting the part that I personally wrote and discussed at today's ov.

## 2015-09-21 NOTE — Patient Instructions (Addendum)
Change neurontin to take 600mg  twice daily   Continue dulera 100 Take 1 puffs first thing in am and then another 1 puffs about 12 hours later.   Only use your albuterol as a rescue medication to be used if you can't catch your breath by resting or doing a relaxed purse lip breathing pattern.  - The less you use it, the better it will work when you need it. - Ok to use up to 2 puffs  every 4 hours if you must but call for immediate appointment if use goes up over your usual need - Don't leave home without it !!  (think of it like the spare tire for your car)    GERD (REFLUX)  is an extremely common cause of respiratory symptoms just like yours , many times with no obvious heartburn at all.    It can be treated with medication, but also with lifestyle changes including elevation of the head of your bed (ideally with 6 inch  bed blocks),  Smoking cessation, avoidance of late meals, excessive alcohol, and avoid fatty foods, chocolate, peppermint, colas, red wine, and acidic juices such as orange juice.  NO MINT OR MENTHOL PRODUCTS SO NO COUGH DROPS  USE SUGARLESS CANDY INSTEAD (Jolley ranchers or Stover's or Life Savers) or even ice chips will also do - the key is to swallow to prevent all throat clearing. NO OIL BASED VITAMINS - use powdered substitutes.    Please schedule a follow up office visit in 4 weeks, sooner if needed bring all inhalers and medications

## 2015-10-03 ENCOUNTER — Telehealth: Payer: Self-pay | Admitting: Neurology

## 2015-10-03 NOTE — Telephone Encounter (Signed)
Called pt back. She would like to hold off on botox injections for right now. She is doing better and feels her migraines are better controlled. She will call back to r/s once she feels she needs injections. She would also like a call to discuss how much she will have to pay for botox on her part. Advised I will send message to Andee Poles, our botox coordinator to discuss this. She verbalized understanding.

## 2015-10-03 NOTE — Telephone Encounter (Signed)
Dr Jaynee Eagles- are you ok with this? You last saw patient on 08/31/15

## 2015-10-03 NOTE — Telephone Encounter (Signed)
That's fine. thanks

## 2015-10-03 NOTE — Telephone Encounter (Signed)
[place] needs clarification on number of units used with botox each time. She will send a form via fax to [phone]  [facility]

## 2015-10-03 NOTE — Telephone Encounter (Signed)
Pt called said the HA's are under control and she is wanting to c/a the appt until next month or until the HA's start back again. Please call

## 2015-10-04 NOTE — Telephone Encounter (Signed)
Spoke to [employer], Scientist, forensic. She received fax and has taken care of this. Nothing further needed at this time.

## 2015-10-06 ENCOUNTER — Ambulatory Visit: Payer: Medicare Other | Admitting: Neurology

## 2015-10-13 ENCOUNTER — Telehealth: Payer: Self-pay | Admitting: Neurology

## 2015-10-13 NOTE — Telephone Encounter (Signed)
Savannah Taylor/ [facility] call on a pa for botox.  [phone] option 2

## 2015-10-19 ENCOUNTER — Encounter: Payer: Self-pay | Admitting: Internal Medicine

## 2015-10-19 ENCOUNTER — Ambulatory Visit (INDEPENDENT_AMBULATORY_CARE_PROVIDER_SITE_OTHER): Payer: Medicare Other | Admitting: Internal Medicine

## 2015-10-19 VITALS — BP 116/64 | HR 75 | Ht 65.0 in | Wt 209.0 lb

## 2015-10-19 DIAGNOSIS — R058 Other specified cough: Secondary | ICD-10-CM

## 2015-10-19 DIAGNOSIS — R05 Cough: Secondary | ICD-10-CM | POA: Diagnosis not present

## 2015-10-19 DIAGNOSIS — J45991 Cough variant asthma: Secondary | ICD-10-CM | POA: Diagnosis not present

## 2015-10-19 MED ORDER — MOMETASONE FURO-FORMOTEROL FUM 100-5 MCG/ACT IN AERO: 1.0000 | INHALATION_SPRAY | Freq: Two times a day (BID) | RESPIRATORY_TRACT | 11 refills | Status: DC

## 2015-10-19 MED ORDER — GABAPENTIN 600 MG PO TABS: 600.0000 mg | ORAL_TABLET | Freq: Two times a day (BID) | ORAL | 11 refills | Status: DC

## 2015-10-19 NOTE — Progress Notes (Signed)
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  W/u  - PFT's 08/03/11 FEV1 2.77 (90%) ratio 79 no change p B2, DLCO 71 corrects to 107    - Trial off coreg > bystolic 20 mg starting Q000111Q no better    - 09/04/2011   Walked RA x one lap @ 185 stopped due to  Sob, no desat   -  75% hfa p coaching 59/10/2011    History of Present Illness  09/04/2011 1st pulmonary eval cc variable sob but always when exerts like one flight of steps and ventolin seemed to helped the most, resting did not always relieve it but never comes on at rest. Also with exposure to paint fumes.. Takes nexium x 3-4 months s benefit but coughs/ wheezes when singing  rec bystolic 20 mg one daily ok to take twice daily if needed nexium 40 mg Take 30-60 min before first meal of the day  GERD diet    10/11/2011 f/u ov/Savannah Taylor could not tolerate bystolic due to ha but no change on breathing p a week so went back on coreg. No longer using ventolin "wheezing" resolves with or without ventolin. Adherent with gerd rx.   rec Dulera 100 Take 2 puffs first thing in am and then another 2 puffs about 12 hours later.  Work on inhaler technique:     12/03/2011 NP ? Worse off dulera  Restart Dulera 100 Take 2 puffs first thing in am and then another 2 puffs about 12 hours later F/u in 2 m > did not do because of husband's stroke     08/19/2015  Consultation/ Savannah Taylor re prob cough variant asthma  Chief Complaint  Patient presents with  . Advice Only    referred by Dr. Justin Mend for SOB.  l/s 2013.    extremely confusing hx, being seen by multiple providers not in epic each telling her something different and hard to get her symptoms as  dwells on various non-specific dx's like "bronchitis" and "pulmonary artery narrowing" but  Def  Gradually worse cough /sob off albuterol and symbicort but not really better on it either (and not clear whether she was taking symbicort when she underwent last set of pfts ) with symptoms dating back to 2013 but present since 6 months rec Pantoprazole (protonix) 40 mg   Take  30-60 min before first meal of the day and Pepcid (famotidine)  20 mg one @  bedtime until return to office - this is the best way to tell whether stomach acid is contributing to your problem.   GERD  Diet   Plan A = Automatic = Dulera 100 Take 2 puffs first thing in am and then another 2 puffs about 12 hours later.  Work on Orthoptist B = Backup Only use your albuterol as a rescue medication Please remember to go to the lab  department downstairs for your tests - we will call you with the results when they are available. 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 Follow-up    Breathing has improved some. She states that she still feels like something is stuck in her chest when she takes in a breath.   still cough with breath in, still feel like something stuck in throat   Sleeping fine / no noct spells/ no need for saba  Only used one dulera 100 x 15 day sample since last ov  rec Change neurontin to take 600mg  twice daily  Continue dulera 100 Take 1 puffs first thing in am and then another 1 puffs about 12 hours later.  Only use your albuterol as a rescue medication GERD   Please schedule a follow up office visit in 4 weeks, sooner if needed bring all inhalers and medications    10/19/2015  f/u ov/Savannah Taylor re: cough variant asthma/ dulera 100 one bid and proair once since last ov  Chief Complaint  Patient presents with  . Follow-up      Breathing is unchanged. She has only been using her Dulera prn and has used approx 3 since her last visit here.    dulera 100 taking one twice daily / and rare need for rescue  Gabapentin 600 bid  Overall much better than baseline, still lump in throat sensation but not interfering with sleep/ swallowing   No obvious day to day or daytime variability or assoc excess/ purulent sputum or mucus plugs or hemoptysis or cp or chest tightness, subjective wheeze or overt sinus or hb symptoms. No unusual exp hx or h/o childhood pna/ asthma or knowledge of premature birth.  Sleeping ok without nocturnal  or early am exacerbation  of respiratory  c/o's or need for noct saba. Also denies any obvious fluctuation of symptoms with weather or environmental changes or other aggravating or alleviating factors except as outlined above   Current Medications, Allergies, Complete Past Medical History, Past Surgical History, Family History, and Social History were reviewed in [employer] record.  ROS  The following are not active complaints unless bolded sore throat, dysphagia, dental problems, itching, sneezing,  nasal congestion or excess/ purulent secretions, ear ache,   fever, chills, sweats, unintended wt loss, classically pleuritic or exertional cp,  orthopnea pnd or leg swelling, presyncope, palpitations, abdominal pain, anorexia, nausea, vomiting, diarrhea  or change in bowel or bladder habits, change in stools or urine, dysuria,hematuria,  rash, arthralgias, visual complaints, headache, numbness, weakness or ataxia or problems with walking or coordination,  change in mood/affect or memory.             Objective:   Physical Exam   amb obese bf nad    10/19/2015     209  09/21/2015       207   08/19/15 217 lb (98.4 kg)  07/02/12 196 lb 3.4 oz (89 kg)  12/03/11 214 lb 4.8 oz (97.2 kg)    Vital signs reviewed - Note on arrival 02 sats  99% on RA    HEENT: nl dentition,  turbinates, and orophanx. Nl external ear canals without cough reflex   NECK :  without JVD/Nodes/TM/ nl carotid upstrokes bilaterally   LUNGS: no acc muscle use, clear to A and P bilaterally with no longer any cough at end exp    CV:  RRR  no s3 or murmur or increase in P2, no edema   ABD:  soft and nontender with nl excursion in the supine position. No  bruits or organomegaly, bowel sounds nl  MS:  warm without deformities, calf tenderness, cyanosis or clubbing  SKIN: warm and dry without lesions    NEURO:  alert, approp, no deficits            Assessment & Plan:

## 2015-10-19 NOTE — Patient Instructions (Addendum)
Continue medications as they are  Please see patient coordinator before you leave today  to schedule ent eval for the lump in your throat - take your meds with you   Please schedule a follow up visit in 3 months but call sooner if needed

## 2015-10-22 NOTE — Assessment & Plan Note (Addendum)
Max gerd rx 08/19/15 > min improvement 09/21/2015 > rec gabapentin 600 bid (as on prn dosing anyway ) - still present 10/19/2015 on neurontin and max gerd rx  > referred to ENT

## 2015-10-22 NOTE — Assessment & Plan Note (Signed)
PFTs 07/13/15  wnl  X  for minimal nonspecific decrease in dlco and erv 17%  FENO 08/19/2015  =  21 off all rx  08/19/2015    rechallenge with dulera 100 2bid x 4 week sample then ov  > only used one 15 day sample 09/21/2015  - Allergy profile 08/19/15 >  Eos 0.4 /  IgE  595 Pos RAST Dog >> cockroach > dust  - 09/21/2015   rechallenge with dulera 100 one bid  - 10/19/2015  After extensive coaching HFA effectiveness =    90% baseline > continue duelra 100 one bid   All goals of chronic asthma(if that's what this is)  control met including optimal function and elimination of symptoms with minimal need for rescue therapy on subtherapeutic dulera 100 dosing so no need to change it for now  Contingencies discussed in full including contacting this office immediately if not controlling the symptoms using the rule of two's.      I had an extended discussion with the patient reviewing all relevant studies completed to date and  lasting 15 to 20 minutes of a 25 minute visit    Each maintenance medication was reviewed in detail including most importantly the difference between maintenance and prns and under what circumstances the prns are to be triggered using an action plan format that is not reflected in the computer generated alphabetically organized AVS.    Please see instructions for details which were reviewed in writing and the patient given a copy highlighting the part that I personally wrote and discussed at today's ov.

## 2015-10-22 NOTE — Assessment & Plan Note (Signed)
Body mass index is 34.78 trending up   No results found for: TSH   Contributing to gerd tendency/ doe/reviewed the need and the process to achieve and maintain neg calorie balance > defer f/u primary care including intermittently monitoring thyroid status

## 2016-01-09 ENCOUNTER — Telehealth: Payer: Self-pay | Admitting: Neurology

## 2016-01-09 MED ORDER — ELETRIPTAN HYDROBROMIDE 40 MG PO TABS: 40.0000 mg | ORAL_TABLET | ORAL | 0 refills | Status: DC | PRN

## 2016-01-09 NOTE — Telephone Encounter (Signed)
Called and spoke to patient. Advised we cannot do stadol infusion per AA,MD. We do not prescribe narcotics. She is having frequent headaches. She has one right now. She has had 3 headaches within this past week.  She has tried taking diclofenac and chlorpromazine, which was prescribed by Dr Domingo Cocking. She last took these when she has first headache last week Thursday/Friday. She has not taken anything since. She has not taken anything today for her headache. She is going to take something after I get off the phone for her. She mentioned taking thorazine. I placed pt on hold and spoke with AA,MD. Per AA,MD she recommends she take the diclofenac instead and we can call her in some relpax (eletriptan), which she can take with the diclofenac. I relayed this to pt. Pt agreeable to this plan. I verified pt pharmacy:  [place], [place] - [address] [phone] (Phone) [phone] (Fax)   Advised I will send to her pharmacy today.  She was not too sure about botox and getting all those shots. I reaasured patient and stated we can use lidocaine numbing cream for her next injection to make it more comfortable for her. She is agreeable to schedule a second injection. Advised I will have Julian Reil, our botox coordinator call her sometime this week to schedule her next injection. She verbalized understanding.

## 2016-01-09 NOTE — Telephone Encounter (Signed)
Faxed printed/signed rx relpax to pt pharmacy. [phone]. Received confirmation.

## 2016-01-09 NOTE — Addendum Note (Signed)
Addended by: Hope Pigeon on: 01/09/2016 04:38 PM   Modules accepted: Orders

## 2016-01-09 NOTE — Telephone Encounter (Signed)
Pt is wanting to know if there is something else she could have instead of botox. Says in the past at another facility she got stadol cocktail. Please call

## 2016-01-10 ENCOUNTER — Encounter: Payer: Self-pay | Admitting: *Deleted

## 2016-01-10 NOTE — Telephone Encounter (Signed)
appt scheduled for 2/1 @ 2:45

## 2016-01-10 NOTE — Telephone Encounter (Signed)
Called to schedule patient for injection, she did not answer so I left a VM asking her to call me back.

## 2016-01-11 ENCOUNTER — Telehealth: Payer: Self-pay | Admitting: Neurology

## 2016-01-11 NOTE — Telephone Encounter (Signed)
Called and spoke to pt pharmacy. Spoke with Summer. She stated she has been trying to reach our office but was on hold for awhile. She was trying to get fax number to fax PA request to [place]. I gave her [phone] to fax. Advised I will call her back if I do not receive anything. She verbalized understanding.

## 2016-01-11 NOTE — Telephone Encounter (Signed)
Pt called said pharmacy advised her eletriptan (RELPAX) 40 MG tablet ins will not cover, it needs PA.

## 2016-01-11 NOTE — Telephone Encounter (Signed)
Received fax from pharmacy. In process of starting PA.

## 2016-01-16 NOTE — Telephone Encounter (Signed)
Called and spoke to pt. She verified that she has tried and failed the following migraine rescue medications: sumatriptan (tablet/injection), rizatriptan, and zolmitriptan. Advised I am working on the PA and once I get an update I will call her and update her as well. She verbalized understanding.

## 2016-01-16 NOTE — Telephone Encounter (Signed)
Called [facility] and did PA for eletriptan over the phone. Spoke with Michiel Cowboy. PA# [id number]. Pending approval, sent to pharmacist. They will send fax with response.

## 2016-01-17 NOTE — Telephone Encounter (Signed)
Called and spoke to [place] at patient's pharmacy. Advised PA on eletriptan approved. She verbalized understanding and stated it was going through on their system ok. Nothing further needed at this time.   Called and spoke to pt. Advised PA approved. She verbalized understanding.

## 2016-01-17 NOTE — Telephone Encounter (Signed)
Received fax from [facility]. PA approved for rx eletriptan through 12/31/16.  Patient ID# [id number].

## 2016-01-17 NOTE — Telephone Encounter (Signed)
Tried calling pharmacy to notify about approval. Pharmacy not open until 9am. Will try later

## 2016-01-19 ENCOUNTER — Ambulatory Visit: Payer: Medicare Other | Admitting: Internal Medicine

## 2016-01-19 ENCOUNTER — Ambulatory Visit: Payer: Medicare Other | Admitting: Neurology

## 2016-01-24 ENCOUNTER — Telehealth: Payer: Self-pay | Admitting: Neurology

## 2016-01-24 MED ORDER — ONABOTULINUMTOXINA 100 UNITS IJ SOLR: INTRAMUSCULAR | 3 refills | Status: DC

## 2016-01-24 NOTE — Telephone Encounter (Signed)
Done, thanks

## 2016-01-24 NOTE — Telephone Encounter (Signed)
Savannah Taylor, would send an Rx. For botox 100 units vial qty of 2 to [facility] for this patient?

## 2016-02-02 ENCOUNTER — Telehealth: Payer: Self-pay | Admitting: Neurology

## 2016-02-02 ENCOUNTER — Ambulatory Visit (INDEPENDENT_AMBULATORY_CARE_PROVIDER_SITE_OTHER): Payer: Medicare Other | Admitting: Neurology

## 2016-02-02 VITALS — BP 119/85 | HR 82 | Ht 65.0 in

## 2016-02-02 DIAGNOSIS — G43709 Chronic migraine without aura, not intractable, without status migrainosus: Secondary | ICD-10-CM | POA: Diagnosis not present

## 2016-02-02 MED ORDER — TOPIRAMATE 100 MG PO TABS: 100.0000 mg | ORAL_TABLET | Freq: Every day | ORAL | 11 refills | Status: DC

## 2016-02-02 NOTE — Telephone Encounter (Signed)
Patient was here, would you call and schedule her next injections? thanks

## 2016-02-02 NOTE — Progress Notes (Signed)
Botox-100unitsx2 vials Lot: [id number] Expiration: 06/2018 NDC: [id number]  0.9% Sodium Chloride bacteriostatic- 52mL total Lot: [id number] Expiration: 06/01/2017 NDC: [id number]  Dx: [id number] Specialty

## 2016-02-02 NOTE — Progress Notes (Signed)

## 2016-02-07 ENCOUNTER — Telehealth: Payer: Self-pay | Admitting: Neurology

## 2016-02-07 NOTE — Telephone Encounter (Signed)
Pt called says she is still HA's, no relief with botox. Please call

## 2016-02-07 NOTE — Telephone Encounter (Signed)
Pt had Botox injections for migraines on 02/02/16. She also has Topamax and Relpax prescribed. Continues to c/o HA despite meds/treatment.

## 2016-02-07 NOTE — Telephone Encounter (Signed)
Looks like she has only had one injection of botox. As discussed multiple times it can take up to 3 injections to see maximal benefits and I have encouraged her to have 3 series of shots. We can increase her Topiramate to 150mg  if she is not having side effects thanks

## 2016-02-08 MED ORDER — ELETRIPTAN HYDROBROMIDE 40 MG PO TABS: 40.0000 mg | ORAL_TABLET | ORAL | 0 refills | Status: DC | PRN

## 2016-02-08 NOTE — Telephone Encounter (Signed)
Returned call and spoke to patient. Explained to her again that it can take up to 3 Botox treatments before she has benefit. She agreed to try increasing topiramate to 150 mg as she's done well on 100 mg dose. Let her know that this is also a preventive medication and it may take time to become therapeutic. In the meantime, she can use Relpax as needed for migraines. Refill was e-scribed to pt's pharmacy. She mentioned that lidocaine nasal spray has been helpful in the past and asks if Dr. Jaynee Eagles would be willing to prescribe.

## 2016-02-08 NOTE — Telephone Encounter (Signed)
Thanks Savannah Taylor. She has failed multiple medications and has been told by other doctors that they have nothing else to offer her. I suggest more botox injections, that is what I can offer her or we can refer her to [facility] thanks

## 2016-02-08 NOTE — Addendum Note (Signed)
Addended by: Monte Fantasia on: 02/08/2016 05:49 PM   Modules accepted: Orders

## 2016-02-08 NOTE — Telephone Encounter (Signed)
Called patient to schedule her next injection. She stated that she did not wan to schedule because she has had no relief. I told her that Dr. Jaynee Eagles suggest having 3 injections before making a decision on whether or not to continue treatment. She stated that she would schedule the next one but she would like for me to call her back at a later time because she is still in bed. Will call back later to schedule.

## 2016-02-09 NOTE — Telephone Encounter (Signed)
Relpax rx printed, signed and faxed to [facility].

## 2016-02-23 ENCOUNTER — Encounter: Payer: Self-pay | Admitting: Internal Medicine

## 2016-02-23 ENCOUNTER — Ambulatory Visit (INDEPENDENT_AMBULATORY_CARE_PROVIDER_SITE_OTHER): Payer: Medicare Other | Admitting: Internal Medicine

## 2016-02-23 VITALS — BP 112/80 | HR 65 | Ht 65.0 in | Wt 207.0 lb

## 2016-02-23 DIAGNOSIS — R05 Cough: Secondary | ICD-10-CM

## 2016-02-23 DIAGNOSIS — J45991 Cough variant asthma: Secondary | ICD-10-CM | POA: Diagnosis not present

## 2016-02-23 DIAGNOSIS — R058 Other specified cough: Secondary | ICD-10-CM

## 2016-02-23 NOTE — Progress Notes (Signed)
Subjective:    Patient ID: Savannah Taylor, female    DOB: 1955-12-14,     MRN: [id number]     Brief patient profile:  61  yobf former ward secretary at [employer] never smoker some sinus problems with hay fever but never sob outgrew at age 61's until around 2008 onset of sob with  wheezing referred 09/04/2011 to Pulmonary clinic by Dr Marliss Czar.  W/u  - PFT's 08/03/11 FEV1 2.77 (90%) ratio 79 no change p B2, DLCO 71 corrects to 107    - Trial off coreg > bystolic 20 mg starting [id number] no better    - 09/04/2011   Walked RA x one lap @ 185 stopped due to  Sob, no desat   -  75% hfa p coaching 10/11/2011    History of Present Illness  09/04/2011 1st pulmonary eval cc variable sob but always when exerts like one flight of steps and ventolin seemed to helped the most, resting did not always relieve it but never comes on at rest. Also with exposure to paint fumes.. Takes nexium x 3-4 months s benefit but coughs/ wheezes when singing  rec bystolic 20 mg one daily ok to take twice daily if needed nexium 40 mg Take 30-60 min before first meal of the day  GERD diet    10/11/2011 f/u ov/Savannah Taylor could not tolerate bystolic due to ha but no change on breathing p a week so went back on coreg. No longer using ventolin "wheezing" resolves with or without ventolin. Adherent with gerd rx.   rec Dulera 100 Take 2 puffs first thing in am and then another 2 puffs about 12 hours later.  Work on inhaler technique:     12/03/2011 NP ? Worse off dulera  Restart Dulera 100 Take 2 puffs first thing in am and then another 2 puffs about 12 hours later F/u in 2 m > did not do because of husband's stroke     08/19/2015  Consultation/ Deveon Kisiel re prob cough variant asthma  Chief Complaint  Patient presents with  . Advice Only    referred by Dr. Justin Mend for SOB.  l/s 2013.    extremely confusing hx, being seen by multiple providers not in epic each telling her something different and hard to get her symptoms as  dwells on various non-specific dx's like "bronchitis" and "pulmonary artery narrowing" but  Def  Gradually worse cough /sob off albuterol and symbicort but not really better on it either (and not clear whether she was taking symbicort when she underwent last set of pfts ) with symptoms dating back to 2013 but present since 60 months rec Pantoprazole (protonix) 40 mg   Take  30-60 min before first meal of the day and Pepcid (famotidine)  20 mg one @  bedtime until return to office - this is the best way to tell whether stomach acid is contributing to your problem.   GERD  Diet   Plan A = Automatic = Dulera 100 Take 2 puffs first thing in am and then another 2 puffs about 12 hours later.  Work on Orthoptist B = Backup Only use your albuterol as a rescue medication Please remember to go to the lab  department downstairs for your tests - we will call you with the results when they are available. 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 Follow-up    Breathing has improved some. She states that she still feels like something is stuck in her chest when she takes in a breath.   still cough with breath in, still feel like something stuck in throat   Sleeping fine / no noct spells/ no need for saba  Only used one dulera 100 x 15 day sample since last ov  rec Change neurontin to take 600mg  twice daily  Continue dulera 100 Take 1 puffs first thing in am and then another 1 puffs about 12 hours later.  Only use your albuterol as a rescue medication GERD   Please schedule a follow up office visit in 4 weeks, sooner if needed bring all inhalers and medications    10/19/2015  f/u ov/Savannah Taylor re: cough variant asthma/ dulera 100 one bid and proair once since last ov  Chief Complaint  Patient presents with  . Follow-up      Breathing is unchanged. She has only been using her Dulera prn and has used approx 3 since her last visit here.    dulera 100 taking one twice daily / and rare need for rescue  Gabapentin 600 bid  Overall much better than baseline, still lump in throat sensation but not interfering with sleep/ swallowing  rec Continue medications as they are Please see patient coordinator before you leave today  to schedule ent eval for the lump in your throat - did not do      02/23/2016  f/u ov/Savannah Taylor re: cough variant asthma/ still on dulera 100 2bid (instruction was one bid )  Chief Complaint  Patient presents with  . Follow-up    Still has minimal cough.  No new co's today.    worse lump in throat with dulera 100 2bid  but better breathing, did not follow instructions re try  one bid   Not limited by breathing from desired activities  / no need for saba   No obvious day to day or daytime variability or assoc excess/ purulent sputum or mucus plugs or hemoptysis or cp or chest tightness, subjective wheeze or overt sinus or hb symptoms. No unusual exp hx or h/o childhood pna/ asthma or knowledge of premature birth.  Sleeping ok without nocturnal  or early am exacerbation  of respiratory  c/o's or need for noct saba. Also denies any obvious fluctuation of symptoms with weather or environmental changes or other aggravating or alleviating factors except as outlined above   Current Medications, Allergies, Complete Past Medical History, Past Surgical History, Family History, and Social History were reviewed in [employer] record.  ROS  The following are not active complaints unless bolded sore throat, dysphagia, dental problems, itching, sneezing,  nasal congestion or excess/ purulent secretions, ear ache,   fever, chills, sweats, unintended wt loss, classically pleuritic or exertional cp,  orthopnea pnd or leg swelling, presyncope, palpitations, abdominal pain, anorexia, nausea,  vomiting, diarrhea  or change in bowel or bladder habits, change in stools or urine, dysuria,hematuria,  rash, arthralgias, visual complaints, headache, numbness, weakness or ataxia or problems with walking or coordination,  change in mood/affect or memory.                Objective:   Physical Exam   amb obese bf nad   02/23/2016       207  10/19/2015     209  09/21/2015  207   08/19/15 217 lb (98.4 kg)  07/02/12 196 lb 3.4 oz (89 kg)  12/03/11 214 lb 4.8 oz (97.2 kg)    Vital signs reviewed -  - Note on arrival 02 sats  100% on RA    HEENT: nl dentition, turbinates, and orophanx. Nl external ear canals without cough reflex   NECK :  without JVD/Nodes/TM/ nl carotid upstrokes bilaterally   LUNGS: no acc muscle use, clear to A and P bilaterally with no longer any cough at end exp    CV:  RRR  no s3 or murmur or increase in P2, no edema   ABD:  soft and nontender with nl excursion in the supine position. No bruits or organomegaly, bowel sounds nl  MS:  warm without deformities, calf tenderness, cyanosis or clubbing  SKIN: warm and dry without lesions    NEURO:  alert, approp, no deficits            Assessment & Plan:

## 2016-02-23 NOTE — Patient Instructions (Addendum)
dulera 100 should be one twice daily  - if doing worse and need for albuterol then the dose the dulera 2 every 12hours  Please see patient coordinator before you leave today  to schedule ent eval   Please schedule a follow up visit in 6  months but call sooner if needed

## 2016-02-24 NOTE — Assessment & Plan Note (Signed)
Max gerd rx 08/19/15 > min improvement 09/21/2015 > rec gabapentin 600 bid (as on prn dosing anyway ) - still present 10/19/2015 on neurontin and max gerd rx   > referred to ENT  But did not go > referred again 02/23/2016

## 2016-02-24 NOTE — Assessment & Plan Note (Signed)
PFTs 07/13/15  wnl  X  for minimal nonspecific decrease in dlco and erv 17%  FENO 08/19/2015  =  21 off all rx  08/19/2015    rechallenge with dulera 100 2bid x 4 week sample then ov  > only used one 15 day sample 09/21/2015  - Allergy profile 08/19/15 >  Eos 0.4 /  IgE  595 Pos RAST Dog >> cockroach > dust  - 09/21/2015   rechallenge with dulera 100 one bid  - 10/19/2015  After extensive coaching HFA effectiveness =    90% baseline > continue duelra 100 one bid   All goals of chronic asthma control met including optimal function and elimination of symptoms with minimal need for rescue therapy.  Should try the lower dose as the higher doses may be contributing to globus sensation and if flares on the lower dose go back to the  Higher dose with spacer training /use option.  Contingencies discussed in full including contacting this office immediately if not controlling the symptoms using the rule of two's.     I had an extended discussion with the patient reviewing all relevant studies completed to date and  lasting 15 to 20 minutes of a 25 minute visit    Each maintenance medication was reviewed in detail including most importantly the difference between maintenance and prns and under what circumstances the prns are to be triggered using an action plan format that is not reflected in the computer generated alphabetically organized AVS.    Please see AVS for specific instructions unique to this visit that I personally wrote and verbalized to the the pt in detail and then reviewed with pt  by my nurse highlighting any  changes in therapy recommended at today's visit to their plan of care.

## 2016-02-24 NOTE — Assessment & Plan Note (Signed)
Body mass index is 34.45 kg/m.  Trending down slightly, encouraged No results found for: TSH   Contributing to gerd risk/ doe/reviewed the need and the process to achieve and maintain neg calorie balance > defer f/u primary care including intermittently monitoring thyroid status

## 2016-02-29 ENCOUNTER — Other Ambulatory Visit: Payer: Self-pay | Admitting: Neurology

## 2016-02-29 MED ORDER — DIHYDROERGOTAMINE MESYLATE 4 MG/ML NA SOLN: NASAL | 12 refills | Status: DC

## 2016-02-29 NOTE — Telephone Encounter (Signed)
I called in [place] to the [facility] pharmacy with instructions. Let her know not to use it with the relpax or any triptan but she can take it with ibuprofen or tylenol. thanks

## 2016-02-29 NOTE — Telephone Encounter (Signed)
Pt says relpax is not helping. She would like to try something else until botox kicks in. Says she has used stadol NS in the past and another NS but can't remember the name of it, it was prescribed by Dr Domingo Cocking. Please call her at [phone]

## 2016-02-29 NOTE — Telephone Encounter (Signed)
I will send her in Migranal nasal spray, please let her know thanks

## 2016-03-01 NOTE — Telephone Encounter (Signed)
Received faxed PA request this afternoon.

## 2016-03-01 NOTE — Telephone Encounter (Signed)
Pt says pharmacy called her and advise medication will need PA

## 2016-03-01 NOTE — Telephone Encounter (Signed)
Called pt w/ instructions for Migranal per Dr. Jaynee Eagles. Verbalized understanding and appreciation for call.

## 2016-03-05 ENCOUNTER — Telehealth: Payer: Self-pay | Admitting: Neurology

## 2016-03-05 NOTE — Telephone Encounter (Signed)
Patient calling stating [facility] at [facility] says PA needed for dihydroergotamine (MIGRANAL) 4 MG/ML nasal spray.

## 2016-03-07 NOTE — Telephone Encounter (Signed)
PA initiated via Cover My Meds 

## 2016-03-08 NOTE — Telephone Encounter (Signed)
Migranal nasal spray [id number] approved for non-formulary exception through 12/31/16 under Medicare Part D benefit. Pt notified via TC.

## 2016-03-08 NOTE — Telephone Encounter (Signed)
Patient called office and notified of Migranal nasal spray approval.

## 2016-03-08 NOTE — Telephone Encounter (Signed)
Received fax from [employer] requesting additional information on [id number].  Pt has tried Topamax, verapamil, amitriptyline, Depakote, Inderal. She has had imitrex injections, maxalt, zomig. Neurontin, Effexor, Cymbalta. Botox helped a lot but then her physician said insurance wouldn't pay.  Info updated and faxed back to [phone].

## 2016-03-15 DIAGNOSIS — K219 Gastro-esophageal reflux disease without esophagitis: Secondary | ICD-10-CM | POA: Insufficient documentation

## 2016-03-15 DIAGNOSIS — R059 Cough, unspecified: Secondary | ICD-10-CM | POA: Insufficient documentation

## 2016-05-23 ENCOUNTER — Other Ambulatory Visit: Payer: Self-pay | Admitting: Family Medicine

## 2016-05-23 DIAGNOSIS — Z1231 Encounter for screening mammogram for malignant neoplasm of breast: Secondary | ICD-10-CM

## 2016-05-31 ENCOUNTER — Ambulatory Visit
Admission: RE | Admit: 2016-05-31 | Discharge: 2016-05-31 | Disposition: A | Discharge status: Home / Self Care | Payer: Medicare Other | Source: Ambulatory Visit | Attending: Family Medicine | Admitting: Family Medicine

## 2016-05-31 DIAGNOSIS — Z1231 Encounter for screening mammogram for malignant neoplasm of breast: Secondary | ICD-10-CM

## 2016-06-22 ENCOUNTER — Ambulatory Visit (INDEPENDENT_AMBULATORY_CARE_PROVIDER_SITE_OTHER): Payer: Medicare Other | Admitting: Neurology

## 2016-06-22 VITALS — BP 121/76 | HR 77

## 2016-06-22 DIAGNOSIS — G43709 Chronic migraine without aura, not intractable, without status migrainosus: Secondary | ICD-10-CM

## 2016-06-22 NOTE — Progress Notes (Signed)
Botox-100unitsx2 vials Lot: [id number] Expiration: 01/2019 [id number]  0.9% Sodium Chloride- 36mL total [id number] Expiration: 03/2018 NDC: 36016-580-06  Dx: J49.494 B/B

## 2016-06-25 NOTE — Progress Notes (Signed)

## 2016-06-26 ENCOUNTER — Telehealth: Payer: Self-pay

## 2016-06-26 NOTE — Telephone Encounter (Signed)
Aimovig service request form/rx completed, signed and faxed to [employer].

## 2016-07-03 ENCOUNTER — Telehealth: Payer: Self-pay | Admitting: Neurology

## 2016-07-03 NOTE — Telephone Encounter (Signed)
Patient had Botox on 06-22-16 and developed spark eyebrows a couple of days ago.Marland Kitchen She was to call Dr. Jaynee Eagles if this occurred. I advised Dr. Jaynee Eagles is out of the office until Thursday but I will send message to her nurse.

## 2016-07-03 NOTE — Telephone Encounter (Signed)
I called the patient. The patient claims that she has a "peaked" eyebrow on one side following the Botox injection. I recommended that she let nature take its course, the effects of the Botox will wear off in time and the eyebrows will become more symmetric.

## 2016-07-03 NOTE — Telephone Encounter (Signed)
Pt had Botox treatment for migraines by Dr. Jaynee Eagles on 06/22/16. She now c/o "spock brows."

## 2016-07-05 NOTE — Telephone Encounter (Signed)
Called and spoke to pt who agreed to come by office tomorrow around 11:15 for additional injection to relax eyebrow. Verbalized understanding and appreciation for call.

## 2016-07-05 NOTE — Telephone Encounter (Signed)
Patient wants to discuss with Dr. Jaynee Eagles her spark eyebrow. She spoke with Dr. Jannifer Franklin but wants a call back from Dr. Jaynee Eagles.

## 2016-07-05 NOTE — Telephone Encounter (Signed)
Can she come in tomorrow? thanks

## 2016-07-06 ENCOUNTER — Ambulatory Visit (INDEPENDENT_AMBULATORY_CARE_PROVIDER_SITE_OTHER): Payer: Self-pay | Admitting: Neurology

## 2016-07-06 VITALS — BP 127/87 | HR 99

## 2016-07-06 DIAGNOSIS — Z411 Encounter for cosmetic surgery: Secondary | ICD-10-CM

## 2016-07-06 NOTE — Progress Notes (Signed)
Botox 1 unit NDC 1443-1540-08 Lot [id number] Exp 01 2021  Diluted in Bacteriostatic 0.9% NaCl NDC 9326-7124-58 Lot [id number] Exp [id number]

## 2016-07-09 NOTE — Progress Notes (Signed)
Patient here after botox for uneven brows. One unit placed above the lateral left brow.

## 2016-07-27 ENCOUNTER — Telehealth: Payer: Self-pay | Admitting: Neurology

## 2016-07-27 NOTE — Telephone Encounter (Signed)
Doesn't looke like she has tried effexor(cymbalta is similar) or fluoxetine. I like them both as ,mgraine preventatives. Would you call and ask her if she would be willing to try one or the other? thanks

## 2016-07-27 NOTE — Telephone Encounter (Signed)
Pt calling due to migraines, Savannah Taylor does not recall the medication that Dr Jaynee Eagles had mentioned but Savannah Taylor is wanting a call back to know if there is something else that can be called in for her due to the constant migraines, please call.

## 2016-07-27 NOTE — Telephone Encounter (Signed)
Pt received Botox injections on 06/22/16 and Aimovig service request was faxed in on 06/26/16.

## 2016-07-30 ENCOUNTER — Other Ambulatory Visit: Payer: Self-pay | Admitting: Neurology

## 2016-07-30 MED ORDER — FLUOXETINE HCL 20 MG PO CAPS: 20.0000 mg | ORAL_CAPSULE | Freq: Every day | ORAL | 3 refills | Status: DC

## 2016-07-30 NOTE — Addendum Note (Signed)
Addended by: Monte Fantasia on: 07/30/2016 09:32 AM   Modules accepted: Orders

## 2016-07-30 NOTE — Telephone Encounter (Signed)
Called in    thanks

## 2016-07-30 NOTE — Telephone Encounter (Signed)
Called and spoke to pt. She says that she has tried Effexor in the past but would be willing to give fluoxetine a try. Would like rx sent in to [facility]. She has not yet heard from [facility] but agreed to call back if they do not contact her w/in the next 1-2 wks.

## 2016-08-01 ENCOUNTER — Other Ambulatory Visit: Payer: Self-pay

## 2016-08-01 MED ORDER — FLUOXETINE HCL 20 MG PO CAPS: 20.0000 mg | ORAL_CAPSULE | Freq: Every day | ORAL | 3 refills | Status: DC

## 2016-08-01 NOTE — Telephone Encounter (Signed)
See previous phone note.  

## 2016-08-01 NOTE — Telephone Encounter (Signed)
Rx for FLUoxetine (PROZAC) 20 MG capsule was sent to wrong pharmacy. Please send to [facility].

## 2016-08-01 NOTE — Telephone Encounter (Signed)
Rx resent to correct pharmacy

## 2016-08-23 ENCOUNTER — Ambulatory Visit: Payer: Medicare Other | Admitting: Internal Medicine

## 2016-09-19 NOTE — Telephone Encounter (Signed)
I spoke to pt and she is asking about her aimovig that she received.  She cannot view video.  I told her that she could get on the website [employer] and view printed instructions with illustrations.  Offered for her to come in for demo, and she would like to do this tomorrow at 1600.  I relayed that this was ok.

## 2016-09-19 NOTE — Telephone Encounter (Signed)
Patient called office to advise Dr. Jaynee Eagles she received shipment for Savannah Taylor (2) today.  She has not done an injection yet and she has a video to watch, but would like to speak with RN to be sure she is doing injection correctly.  Please call

## 2016-09-25 ENCOUNTER — Encounter: Payer: Self-pay | Admitting: Neurology

## 2016-09-25 ENCOUNTER — Ambulatory Visit (INDEPENDENT_AMBULATORY_CARE_PROVIDER_SITE_OTHER): Payer: Self-pay | Admitting: Neurology

## 2016-09-25 VITALS — BP 118/83 | HR 84 | Ht 65.0 in | Wt 200.6 lb

## 2016-09-25 DIAGNOSIS — G43701 Chronic migraine without aura, not intractable, with status migrainosus: Secondary | ICD-10-CM

## 2016-09-26 NOTE — Progress Notes (Signed)

## 2016-10-03 ENCOUNTER — Ambulatory Visit (INDEPENDENT_AMBULATORY_CARE_PROVIDER_SITE_OTHER): Payer: Medicare Other | Admitting: Internal Medicine

## 2016-10-03 ENCOUNTER — Encounter: Payer: Self-pay | Admitting: Internal Medicine

## 2016-10-03 VITALS — BP 124/80 | HR 81 | Ht 65.0 in | Wt 203.0 lb

## 2016-10-03 DIAGNOSIS — J45991 Cough variant asthma: Secondary | ICD-10-CM

## 2016-10-03 LAB — NITRIC OXIDE: Nitric Oxide: 20

## 2016-10-03 NOTE — Assessment & Plan Note (Signed)
PFTs 07/13/15  wnl  X  for minimal nonspecific decrease in dlco and erv 17%  FENO 08/19/2015  =  21 off all rx  08/19/2015    rechallenge with dulera 100 2bid x 4 week sample then ov  > only used one 15 day sample 09/21/2015  - Allergy profile 08/19/15 >  Eos 0.4 /  IgE  595 Pos RAST Dog >> cockroach > dust  - 09/21/2015   rechallenge with dulera 100 one bid    - FENO 10/03/2016  =   20 off all rx x > 72 hours  - 10/03/2016  After extensive coaching HFA effectiveness =    90% > continue dulera 1-2 bid "prn"   All goals of chronic asthma control met including optimal function and elimination of symptoms with minimal need for rescue therapy.  Contingencies discussed in full including contacting this office immediately if not controlling the symptoms using the rule of two's.     Each maintenance medication was reviewed in detail including most importantly the difference between maintenance and as needed and under what circumstances the prns are to be used.  Please see AVS for specific  Instructions which are unique to this visit and I personally typed out  which were reviewed in detail in writing with the patient and a copy provided.    F/u q 3 m

## 2016-10-03 NOTE — Progress Notes (Signed)
Subjective:    Patient ID: Savannah Taylor, female    DOB: Oct 14, 1955,     MRN: [id number]     Brief patient profile:  23 yobf former ward secretary at [employer] never smoker some sinus problems with hay fever but never sob outgrew at age 61's until around 2008 onset of sob with  wheezing referred 09/04/2011 to Pulmonary clinic by Dr Marliss Czar.  W/u  - PFT's 08/03/11 FEV1 2.77 (90%) ratio 79 no change p B2, DLCO 71 corrects to 107    - Trial off coreg > bystolic 20 mg starting 06/03/54> no better    - 09/04/2011   Walked RA x one lap @ 185 stopped due to  Sob, no desat    History of Present Illness  08/19/2015  Consultation/ Ladina Shutters re prob cough variant asthma  Chief Complaint  Patient presents with  . Advice Only    referred by Dr. Justin Mend for SOB.  l/s 2013.    extremely confusing hx, being seen by multiple providers not in epic each telling her something different and hard to get her symptoms as dwells on various non-specific dx's like "bronchitis" and "pulmonary artery narrowing" but  Def  Gradually worse cough /sob off albuterol and symbicort but not really better on it either (and not clear whether she was taking symbicort when she underwent last set of pfts ) with symptoms dating back to 2013 but present since 6 months rec Pantoprazole (protonix) 40 mg   Take  30-60 min before first meal of the day and Pepcid (famotidine)  20 mg one @  bedtime until return to office - this is the best way to tell whether stomach acid is contributing to your problem.   GERD  Diet   Plan A = Automatic = Dulera 100 Take 2 puffs first thing in am and then another 2 puffs about 12 hours later.  Work on Orthoptist B = Backup Only use your albuterol as a rescue medication Please remember to go to the lab  department downstairs for your tests - we will call you with the results when they are available. 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 Follow-up    Breathing has improved some. She states that she still feels like something is stuck in her chest when she takes in a breath.   still cough with breath in, still feel like something stuck in throat   Sleeping fine / no noct spells/ no need for saba  Only used one dulera 100 x 15 day sample since last ov  rec Change neurontin to take 600mg  twice daily  Continue dulera 100 Take 1 puffs first thing in am and then another 1 puffs about 12 hours later.  Only use your albuterol as a rescue medication GERD   Please schedule a follow up office visit in 4 weeks, sooner if needed bring all inhalers and medications    10/19/2015  f/u ov/Lenn Volker re: cough variant asthma/ dulera 100 one bid and proair once since last ov  Chief Complaint  Patient presents with  . Follow-up    Breathing is unchanged. She has only been using her Dulera prn and has used approx 3 since her last visit here.   dulera 100 taking one twice  daily / and rare need for rescue  Gabapentin 600 bid  Overall much better than baseline, still lump in throat sensation but not interfering with sleep/ swallowing  rec Continue medications as they are Please see patient coordinator before you leave today  to schedule ent eval for the lump in your throat - did not do      02/23/2016  f/u ov/Blake Goya re: cough variant asthma/ still on dulera 100 2bid (instruction was one bid )  Chief Complaint  Patient presents with  . Follow-up    Still has minimal cough.  No new co's today.    worse lump in throat with dulera 100 2bid  but better breathing, did not follow instructions re try  one bid  Not limited by breathing from desired activities  / no need for saba  rec  dulera 100 should be one twice daily  - if doing worse and need for albuterol then the dose the dulera 2 every 12hours Please see patient  coordinator before you leave today  to schedule ent eval    10/03/2016  f/u ov/Joell Buerger re: cough variant asthma  Chief Complaint  Patient presents with  . Follow-up    Breathing is overall doing okay- has good and bad days. She has used Dulera x 2 in the past wk and albuterol inhaler rarely.   She states sometimes she can go a whole wk without using the [facility].   last dulera 100 x 3-4 days prior to [place]   Not limited by breathing from desired activities  But very sedentary  No noct symptoms, very rarely uses any saba   No obvious day to day or daytime variability or assoc excess/ purulent sputum or mucus plugs or hemoptysis or cp or chest tightness, subjective wheeze or overt sinus or hb symptoms. No unusual exp hx or h/o childhood pna/ asthma or knowledge of premature birth.  Sleeping ok flat without nocturnal  or early am exacerbation  of respiratory  c/o's or need for noct saba. Also denies any obvious fluctuation of symptoms with weather or environmental changes or other aggravating or alleviating factors except as outlined above   Current Allergies, Complete Past Medical History, Past Surgical History, Family History, and Social History were reviewed in [employer] record.  ROS  The following are not active complaints unless bolded Hoarseness, sore throat, dysphagia, dental problems, itching, sneezing,  nasal congestion or discharge of excess mucus or purulent secretions, ear ache,   fever, chills, sweats, unintended wt loss or wt gain, classically pleuritic or exertional cp,  orthopnea pnd or leg swelling, presyncope, palpitations, abdominal pain, anorexia, nausea, vomiting, diarrhea  or change in bowel habits or change in bladder habits, change in stools or change in urine, dysuria, hematuria,  rash, arthralgias, visual complaints, headache, numbness, weakness or ataxia or problems with walking or coordination,  change in mood/affect or memory.        Current Meds   Medication Sig  . albuterol (PROAIR HFA) 108 (90 Base) MCG/ACT inhaler Inhale 2 puffs into the lungs every 6 (six) hours as needed for wheezing or shortness of breath.  . ALPRAZolam (XANAX) 0.5 MG tablet Take 0.5 mg by mouth at bedtime as needed for anxiety.  . botulinum toxin Type A (BOTOX) 100 units SOLR injection Inject IM into head and neck muscles by provider in office every 3 months  . Cyanocobalamin (VITAMIN B 12 PO) Take 1,000 Units by mouth daily.  Marland Kitchen ezetimibe (ZETIA) 10 MG tablet  Take 10 mg by mouth daily.  . famotidine (PEPCID) 20 MG tablet Take 20 mg by mouth at bedtime.  Marland Kitchen FLUoxetine (PROZAC) 20 MG capsule Take 1 capsule (20 mg total) by mouth daily.  . mometasone-formoterol (DULERA) 100-5 MCG/ACT AERO Inhale 1 puff into the lungs 2 (two) times daily.  Marland Kitchen topiramate (TOPAMAX) 100 MG tablet Take 1 tablet (100 mg total) by mouth at bedtime. Take one tablet 50mg  at bedtime for 2 weeks then increase to 100mg  at bedtime,  . valsartan-hydrochlorothiazide (DIOVAN-HCT) 160-25 MG tablet Take 1 tablet by mouth daily.            Objective:   Physical Exam   amb obese bf nad    10/03/2016       203  02/23/2016       207  10/19/2015     209  09/21/2015       207   08/19/15 217 lb (98.4 kg)  07/02/12 196 lb 3.4 oz (89 kg)  12/03/11 214 lb 4.8 oz (97.2 kg)    Vital signs reviewed -  - Note on arrival 02 sats  97% on RA    HEENT: nl dentition, turbinates, and oropharynx. Nl external ear canals without cough reflex   NECK :  without JVD/Nodes/TM/ nl carotid upstrokes bilaterally   LUNGS: no acc muscle use, clear to A and P bilaterally with no  cough at end exp    CV:  RRR  no s3 or murmur or increase in P2, no edema   ABD:  soft and nontender with nl excursion in the supine position. No bruits or organomegaly, bowel sounds nl  MS:  warm without deformities, calf tenderness, cyanosis or clubbing  SKIN: warm and dry without lesions    NEURO:  alert, approp, no  deficits      Assessment & Plan:

## 2016-10-03 NOTE — Patient Instructions (Addendum)
No change in medications    Please schedule a follow up visit in 6  months but call sooner if needed  

## 2016-10-04 ENCOUNTER — Telehealth: Payer: Self-pay | Admitting: Neurology

## 2016-10-04 NOTE — Telephone Encounter (Signed)
I called the patient and I spoke with her about her medication. The patient has received her free shipment and she used on 09/24/16. She used just one inj and has another inj for next month. I have explained that we will now move everything to her pharmacy for future refills and she verbalized understanding.

## 2016-10-10 ENCOUNTER — Other Ambulatory Visit: Payer: Self-pay | Admitting: Neurology

## 2016-10-10 MED ORDER — ERENUMAB-AOOE 70 MG/ML ~~LOC~~ SOAJ: 70.0000 mg | SUBCUTANEOUS | 11 refills | Status: DC

## 2016-10-23 ENCOUNTER — Telehealth: Payer: Self-pay

## 2016-10-23 NOTE — Telephone Encounter (Signed)
I received an prior auth request for aimovig. I have completed and submitted the PA and should have a determination within 48-72 hours.

## 2016-10-24 NOTE — Telephone Encounter (Signed)
Aimovig 70mg /mL has been approved for non-formulary exception through 12/31/2017.  Reference #: [id number]

## 2017-04-03 ENCOUNTER — Ambulatory Visit: Payer: Medicare Other | Admitting: Internal Medicine

## 2017-04-04 ENCOUNTER — Ambulatory Visit: Payer: Medicare Other | Admitting: Internal Medicine

## 2017-04-10 NOTE — Telephone Encounter (Signed)
Received fax from [facility]. Aimovig was denied because it is not on pt's formulary. Called [employer] to inquire about formulary drugs. Spoke with [employer]. He stated that there is no formulary alternative so appeal can be done. It can be done [email or web address] or we can call [phone].

## 2017-04-10 NOTE — Telephone Encounter (Addendum)
Received notification from [facility] that Aimovig was either subject to limits or is not covered. [place] to inquire. A discount will be authorized for the patient making it $606.24.   Called [employer] clinical pharmacy review dept and discussed Aimovig. Representative stated that Pt started coverage with [employer] on 03/01/2017. PA but need further information from pt on # migraine days per month for last 3 months.    Tried meds: Topamax, verapamil, amitriptyline, Depakote, Inderal. She has had imitrex injections, maxalt, zomig. Neurontin, Effexor, Cymbalta.  Called pt, she stated that she has had 14-15 migraines per month for the last 3 months. They are not as bad. She reported that the Aimovig is helping and she has been "so much better" since starting the Aimovig.   Finished PA by completing form that was faxed from [facility]. Signed by MD and faxed back to [facility]. Received a receipt of confirmation.

## 2017-04-12 ENCOUNTER — Telehealth: Payer: Self-pay | Admitting: Neurology

## 2017-04-12 NOTE — Telephone Encounter (Signed)
Pt called she is needing refill for aimovig. Please see "note" screen for 04/10/17. Please call to advise

## 2017-04-16 ENCOUNTER — Encounter: Payer: Self-pay | Admitting: *Deleted

## 2017-04-16 MED ORDER — ERENUMAB-AOOE 70 MG/ML ~~LOC~~ SOAJ: 70.0000 mg | SUBCUTANEOUS | 0 refills | Status: DC

## 2017-04-16 NOTE — Telephone Encounter (Signed)
Appeal letter written & office note printed.

## 2017-04-16 NOTE — Addendum Note (Signed)
Addended by: Gildardo Griffes on: 04/16/2017 10:36 AM   Modules accepted: Orders

## 2017-04-16 NOTE — Telephone Encounter (Signed)
Patient calling back regarding refill for Erenumab-aooe (AIMOVIG) 43 MG/ML SOAJ which is to be called to [employer] at [facility].

## 2017-04-16 NOTE — Telephone Encounter (Signed)
Spoke with patient and discussed that we are starting the appeal process for her Aimovig. There are active refills at [facility] however she will be unable to get it unless her insurance approves. Dr. Jaynee Eagles has authorized a couple of samples to give pt during this time while we await insurance determination. The patient was very appreciative and will come by the office to pick them up.

## 2017-04-16 NOTE — Telephone Encounter (Signed)
Pt states she is unable to get a ride to the office today but will come by tomorrow. FYI

## 2017-04-17 NOTE — Telephone Encounter (Signed)
Addressed envelope for appeal as requested by [employer] to: Attn: [employer] [place] [address]  Appeal letter and office note packaged in envelope and sent to medical records for processing. Copies kept.

## 2017-04-17 NOTE — Telephone Encounter (Signed)
Pt came to office and picked up 2 Aimovig samples. She verbalized appreciation.

## 2017-04-29 ENCOUNTER — Ambulatory Visit: Payer: Medicare PPO | Admitting: Internal Medicine

## 2017-04-29 ENCOUNTER — Encounter: Payer: Self-pay | Admitting: Internal Medicine

## 2017-04-29 VITALS — BP 122/82 | HR 80 | Ht 65.0 in | Wt 206.0 lb

## 2017-04-29 DIAGNOSIS — J45991 Cough variant asthma: Secondary | ICD-10-CM | POA: Diagnosis not present

## 2017-04-29 MED ORDER — MOMETASONE FURO-FORMOTEROL FUM 100-5 MCG/ACT IN AERO: INHALATION_SPRAY | RESPIRATORY_TRACT | 11 refills | Status: DC

## 2017-04-29 NOTE — Telephone Encounter (Signed)
Called pt & informed her of the Aimovig approval. She still has another dose at home for next month. She will notify pharmacy before next dose is needed. Pt verbalized appreciation for the call.

## 2017-04-29 NOTE — Patient Instructions (Addendum)
No change in medications   Please schedule a follow up visit in 12  months but call sooner if needed  

## 2017-04-29 NOTE — Telephone Encounter (Signed)
Received notification from [facility] that Savannah Taylor has been APPROVED from 04/27/2017 through 12/31/2017.

## 2017-04-29 NOTE — Progress Notes (Signed)
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  W/u  - PFT's 08/03/11 FEV1 2.77 (90%) ratio 79 no change p B2, DLCO 71 corrects to 107    - Trial off coreg > bystolic 20 mg starting 01/09/27> no better    - 09/04/2011   Walked RA x one lap @ 185 stopped due to  Sob, no desat    History of Present Illness  08/19/2015  Consultation/ Savannah Taylor re prob cough variant asthma  Chief Complaint  Patient presents with  . Advice Only    referred by Dr. Justin Mend for SOB.  l/s 2013.    extremely confusing hx, being seen by multiple providers not in epic each telling her something different and hard to get her symptoms as dwells on various non-specific dx's like "bronchitis" and "pulmonary artery narrowing" but  Def  Gradually worse cough /sob off albuterol and symbicort but not really better on it either (and not clear whether she was taking symbicort when she underwent last set of pfts ) with symptoms dating back to 2013 but present since 6 months rec Pantoprazole (protonix) 40 mg   Take  30-60 min before first meal of the day and Pepcid (famotidine)  20 mg one @  bedtime until return to office - this is the best way to tell whether stomach acid is contributing to your problem.   GERD  Diet   Plan A = Automatic = Dulera 100 Take 2 puffs first thing in am and then another 2 puffs about 12 hours later.  Work on Orthoptist B = Backup Only use your albuterol as a rescue medication Please remember to go to the lab  department downstairs for your tests - we will call you with the results when they are available. 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 Follow-up    Breathing has improved some. She states that she still feels like something is stuck in her chest when she takes in a breath.   still cough with breath in, still feel like something stuck in throat   Sleeping fine / no noct spells/ no need for saba  Only used one dulera 100 x 15 day sample since last ov  rec Change neurontin to take 600mg  twice daily  Continue dulera 100 Take 1 puffs first thing in am and then another 1 puffs about 12 hours later.  Only use your albuterol as a rescue medication GERD   Please schedule a follow up office visit in 4 weeks, sooner if needed bring all inhalers and medications    10/19/2015  f/u ov/Savannah Taylor re: cough variant asthma/ dulera 100 one bid and proair once since last ov  Chief Complaint  Patient presents with  . Follow-up    Breathing is unchanged. She has only been using her Dulera prn and has used approx 3 since her last visit here.   dulera 100 taking one twice  daily / and rare need for rescue  Gabapentin 600 bid  Overall much better than baseline, still lump in throat sensation but not interfering with sleep/ swallowing  rec Continue medications as they are Please see patient coordinator before you leave today  to schedule ent eval for the lump in your throat - did not do      02/23/2016  f/u ov/Savannah Taylor re: cough variant asthma/ still on dulera 100 2bid (instruction was one bid )  Chief Complaint  Patient presents with  . Follow-up    Still has minimal cough.  No new co's today.    worse lump in throat with dulera 100 2bid  but better breathing, did not follow instructions re try  one bid  Not limited by breathing from desired activities  / no need for saba  rec  dulera 100 should be one twice daily  - if doing worse and need for albuterol then the dose the dulera 2 every 12hours rec ENT eval  And seen  03/14/16       04/29/2017  f/u ov/Savannah Taylor re: cough variant asthma / variably using dulera  Chief Complaint  Patient presents with  . Follow-up    Breathing is overall doing well. She is wheezing more over the past few wks. She did have to use the [facility] today. She typically uses this about 2 x per month. She never uses her albuterol.     had done well x months off dulera then 3 weeks prior to OV due to wheezing  reStarted dulera 100  2 puffs each am   Then some better so changed to  one pff bid (not the instructions given)  Dyspnea:  Not limited by breathing from desired activities   Cough: better now Sleep: ok  SABA use:  Very rare   Tried off pepcid x 3 weeks x 2 m prior to OV  > worse so resumed each am dosing     No obvious day to day or daytime variability or assoc excess/ purulent sputum or mucus plugs or hemoptysis or cp or chest tightness, subjective wheeze or overt sinus or hb symptoms. No unusual exposure hx or h/o childhood pna/ asthma or knowledge of premature birth.  Sleeping  Ok   without nocturnal  or early am exacerbation  of respiratory  c/o's or need for noct saba. Also denies any obvious fluctuation of symptoms with weather or environmental changes or other aggravating or alleviating factors except as outlined above   Current Allergies, Complete Past Medical History, Past Surgical History, Family History, and Social History were reviewed in [employer] record.  ROS  The following are not active complaints unless bolded Hoarseness, sore throat, dysphagia, dental problems, itching, sneezing,  nasal congestion or discharge of excess mucus or purulent secretions, ear ache,   fever, chills, sweats, unintended wt loss or wt gain, classically pleuritic or exertional cp,  orthopnea pnd or arm/hand swelling  or leg swelling, presyncope, palpitations, abdominal pain, anorexia, nausea, vomiting, diarrhea  or change in bowel habits or change in bladder habits,  change in stools or change in urine, dysuria, hematuria,  rash, arthralgias, visual complaints, headache, numbness, weakness or ataxia or problems with walking or coordination,  change in mood or  memory.        Current Meds  Medication Sig  . albuterol (PROAIR HFA) 108 (90 Base) MCG/ACT inhaler Inhale 2 puffs into the lungs every 6 (six) hours as needed for wheezing or shortness of  breath.  . ALPRAZolam (XANAX) 0.5 MG tablet Take 0.5 mg by mouth at bedtime as needed for anxiety.  Eduard Roux (AIMOVIG) 70 MG/ML SOAJ Inject 70 mg into the skin every 30 (thirty) days.  Marland Kitchen ezetimibe (ZETIA) 10 MG tablet Take 10 mg by mouth daily.  . famotidine (PEPCID) 20 MG tablet Take 20 mg by mouth at bedtime.  Marland Kitchen FLUoxetine (PROZAC) 20 MG capsule Take 1 capsule (20 mg total) by mouth daily.  . mometasone-formoterol (DULERA) 100-5 MCG/ACT AERO Take 2 puffs first thing in am and then another 2 puffs about 12 hours later.  . topiramate (TOPAMAX) 100 MG tablet Take 1 tablet (100 mg total) by mouth at bedtime. Take one tablet 50mg  at bedtime for 2 weeks then increase to 100mg  at bedtime,  . valsartan-hydrochlorothiazide (DIOVAN-HCT) 160-25 MG tablet Take 1 tablet by mouth daily.  .     .             Objective:   Physical Exam   amb obese bf nad   04/29/2017       207  10/03/2016       203  02/23/2016       207  10/19/2015     209  09/21/2015       207   08/19/15 217 lb (98.4 kg)  07/02/12 196 lb 3.4 oz (89 kg)  12/03/11 214 lb 4.8 oz (97.2 kg)     Vital signs reviewed - Note on arrival 02 sats  98% on RA      HEENT: nl dentition, turbinates bilaterally, and oropharynx. Nl external ear canals without cough reflex   NECK :  without JVD/Nodes/TM/ nl carotid upstrokes bilaterally   LUNGS: no acc muscle use,  Nl contour chest which is clear to A and P bilaterally without cough on insp or exp maneuvers   CV:  RRR  no s3 or murmur or increase in P2, and no edema   ABD:  soft and nontender with  nl inspiratory excursion in the supine position. No bruits or organomegaly appreciated, bowel sounds nl  MS:  Nl gait/ ext warm without deformities, calf tenderness, cyanosis or clubbing No obvious joint restrictions   SKIN: warm and dry without lesions    NEURO:  alert, approp, nl sensorium with  no motor or cerebellar deficits apparent.             Assessment & Plan:

## 2017-04-30 ENCOUNTER — Encounter: Payer: Self-pay | Admitting: Internal Medicine

## 2017-04-30 NOTE — Assessment & Plan Note (Signed)
PFTs 07/13/15  wnl  X  for minimal nonspecific decrease in dlco and erv 17%  FENO 08/19/2015  =  21 off all rx  08/19/2015    rechallenge with dulera 100 2bid x 4 week sample then ov  > only used one 15 day sample 09/21/2015  - Allergy profile 08/19/15 >  Eos 0.4 /  IgE  595 Pos RAST Dog >> cockroach > dust  - 09/21/2015   rechallenge with dulera 100 one bid  - FENO 10/03/2016  =   20 off all rx x > 72 hours  - 10/03/2016  After extensive coaching HFA effectiveness =    90% > continue dulera 1-2 bid "prn"    Despite misunderstanding instructions:  All goals of chronic asthma control met including optimal function and elimination of symptoms with minimal need for rescue therapy.  Contingencies discussed in full including contacting this office immediately if not controlling the symptoms using the rule of two's.     Based on the study from NEJM  378; 20 p 1865 (2018) in pts with mild asthma it is reasonable to use low dose symbicort eg 80 2bid "prn" flare in this setting but I emphasized this was only shown with symbicort and takes advantage of the rapid onset of action but is not the same as "rescue therapy" but can be stopped once the acute symptoms have resolved and the need for rescue has been minimized (< 2 x weekly)  .  She has had the same benefit from dulera but needs to remember immediately on onset of symptoms or need for saba to resume the 2 bid dosing, not 2 pff in am or one bid which are the same dose of ICS and not as likely to control an acute flare of airways inflammation   - The proper method of use, as well as anticipated side effects, of a metered-dose inhaler are discussed and demonstrated to the patient.    I had an extended discussion with the patient reviewing all relevant studies completed to date and  lasting 15 to 20 minutes of a 25 minute visit    Each maintenance medication was reviewed in detail including most importantly the difference between maintenance and prns and under  what circumstances the prns are to be triggered using an action plan format that is not reflected in the computer generated alphabetically organized AVS.    Please see AVS for specific instructions unique to this visit that I personally wrote and verbalized to the the pt in detail and then reviewed with pt  by my nurse highlighting any  changes in therapy recommended at today's visit to their plan of care.   

## 2017-06-10 ENCOUNTER — Telehealth: Payer: Self-pay | Admitting: Neurology

## 2017-06-10 NOTE — Telephone Encounter (Signed)
Patient has had a headache for about 3 weeks. She is taking Erenumab-aooe (AIMOVIG) 44 MG/ML SOAJ. Is there something she can take with the [place]. She uses Paediatric nurse at [facility]. Please call ad discuss.

## 2017-06-11 ENCOUNTER — Other Ambulatory Visit: Payer: Self-pay | Admitting: Neurology

## 2017-06-11 MED ORDER — ZOLMITRIPTAN 5 MG NA SOLN: NASAL | 11 refills | Status: DC

## 2017-06-11 NOTE — Telephone Encounter (Signed)
Spoke with patient. Discussed that Dr. Jaynee Eagles ordered Zolmitriptan (Zomig) nasal spray. She should carry it around with her, take it right at onset of headache (one spray in one nostril) and then repeat 2 hours later if needed. No more than twice per day. Prescription sent to [facility]. Pt appreciative and had no further questions.

## 2017-06-11 NOTE — Telephone Encounter (Signed)
Pt requesting a call to discuss medication change

## 2017-06-11 NOTE — Telephone Encounter (Signed)
I called in a nasal spray for her to see if it works. She should carry it around with her, take it right at onset of headache and then 2 hours later if needed. thanks

## 2017-06-12 NOTE — Telephone Encounter (Signed)
Pt has been told that the (Zomig) nasal spray will not be covered under her insurance.  Pt wants to know if the approved list of meds was sent over for Dr Jaynee Eagles to consider. Please call

## 2017-06-12 NOTE — Telephone Encounter (Signed)
Spoke with pt and informed her that we haven't received any update from [place]. Per pt, they said information would be sent here.   Called [facility] and spoke with pharmacist about the Zomig. He stated that message received on claim process attempt is "PA is required and try Sumatriptan succinate".

## 2017-06-13 MED ORDER — METHYLPREDNISOLONE 4 MG PO TBPK: ORAL_TABLET | ORAL | 0 refills | Status: DC

## 2017-06-13 NOTE — Telephone Encounter (Signed)
We have not seen patient for 9 months in the office. If she wants to discuss migraine management please set her up with carolyn or megan please.

## 2017-06-13 NOTE — Telephone Encounter (Signed)
Pt requesting a call to discuss her medication, stating she has had a migraine since Tuesday and doesn't want to go another day with medication

## 2017-06-13 NOTE — Telephone Encounter (Signed)
Called pt & discussed that she has tried many things and we have not seen her in 9 months. We need to see her in the office and the NP can see her Tuesday. We would also like to offer a steroid dose pack. Patient verbalized understanding of all. Also advised pt to proceed to ED right away if new neurological symptoms occur or if her headache worsens. Pt scheduled for Tues 6/18 @ 11:30 arrival time 11:00 with [facility] NP. Also discussed that she can take the daily steroid pills all at one time each day with food. Dose will taper as the week goes on and there will be 21 tablets total. Pt verbalized understanding and appreciation.

## 2017-06-13 NOTE — Addendum Note (Signed)
Addended by: Gildardo Griffes on: 06/13/2017 05:17 PM   Modules accepted: Orders

## 2017-06-18 ENCOUNTER — Ambulatory Visit: Payer: Medicare PPO | Admitting: Adult Health

## 2017-06-18 ENCOUNTER — Encounter: Payer: Self-pay | Admitting: Adult Health

## 2017-06-18 VITALS — BP 120/81 | HR 75 | Ht 65.0 in | Wt 207.6 lb

## 2017-06-18 DIAGNOSIS — G43711 Chronic migraine without aura, intractable, with status migrainosus: Secondary | ICD-10-CM

## 2017-06-18 MED ORDER — ERENUMAB-AOOE 140 MG/ML ~~LOC~~ SOAJ: 140.0000 mg | SUBCUTANEOUS | 3 refills | Status: DC

## 2017-06-18 NOTE — Progress Notes (Signed)
PATIENT: Savannah Taylor DOB: July 21, 1955  REASON FOR VISIT: follow up HISTORY FROM: patient  HISTORY OF PRESENT ILLNESS: Today 06/18/17  Ms. Muilenburg is a 62 year old female with a history of migraine.  She states that she had a severe headache for the last 2 to 3 weeks.  She is on last day of her prednisone Dosepak.  Reports prednisone has been beneficial.  Reports that she still has a mild headache but not as severe as it was before.  She states that her headaches typically occur on the left side.  She always has phonophobia with her headaches.  She remains on Topamax.  She also uses Zomig as needed.  She reports that [place] worked well for her migraines in the first 2 months.  She states by the third month she began having a constant dull headache.  She is currently taking the 70 mg injection.  She returns today for evaluation.  HISTORY:Savannah Taylor is a 62 y.o. female here as a referral from Dr. Justin Mend for migraines. Past medical history of chronic migraines, depression, hypertension, high cholesterol, B12 deficiency, chronic insomnia, chronic kidney disease, multilevel degenerative disease. She has been seeing Dr. Domingo Cocking for nerve injections. Migraines since a teenager without any inciting event or head trauma. She has a family history of migraines, Her son and paternal aunt have migraines. She has been having injections of the headache wellness Center, she went every month and then every 2 months (likely nerve block) but she also says that she received Botox which helped but insurance would not pay anymore. Now she just goes as needed. For nerve blocks. She has headaches 15 days a month. 8-10 are migrainous. This is been ongoing for the last 6 months. Her migraines feele like her head is swelling on one side, but they can move around, one eye droops, feels tight, she can hear her heart beating in her head especially when laying down, more pounding than pressure, she can't stand sound or  noise. Dark rooms help. Being quiet helps. They can last for days straight. They can be severe with nausea and vomiting on average out of 10 can be 6-7/10. She has been to "so many doctors and so many places" and the doctors get frustrated because they can't help. She has been to headaches clinics. She was told she was faking in the past. She has tried everything per patient. No other associated symptoms or focal neurologic deficits or complaints. No aura. No medication overuse.   Tried: Topamax, verapamil, amitriptyline, depakote, inderal, she has had imitrex injections, maxalt, zomig. Neurontin, botox helped a lot but then her physician said insurance wouldn;t pay, effexor, cymbalta.   Reviewed notes, labs and imaging from outside physicians, which showed:  BUN 12 and creatinine 1.15 taken 04/05/2015. TSH 0.810 January 2017.  CT of the head 04/2012 showed No acute intracranial abnormalities including mass lesion or mass effect, hydrocephalus, extra-axial fluid collection, midline shift, hemorrhage, or acute infarction, large ischemic events (personally reviewed images)     REVIEW OF SYSTEMS: Out of a complete 14 system review of symptoms, the patient complains only of the following symptoms, and all other reviewed systems are negative.  Frequent waking, restless leg, swollen abdomen, headache  ALLERGIES: Allergies  Allergen Reactions  . Bystolic [Nebivolol Hcl] Palpitations and Other (See Comments)    Heart races  . Morphine And Related Itching  . Naproxen Hives and Itching    HOME MEDICATIONS: Outpatient Medications Prior to Visit  Medication Sig  Dispense Refill  . albuterol (PROAIR HFA) 108 (90 Base) MCG/ACT inhaler Inhale 2 puffs into the lungs every 6 (six) hours as needed for wheezing or shortness of breath.    . ALPRAZolam (XANAX) 0.5 MG tablet Take 0.5 mg by mouth at bedtime as needed for anxiety.    Eduard Roux (AIMOVIG) 70 MG/ML SOAJ Inject 70 mg into the skin every  30 (thirty) days. 2 pen 0  . ezetimibe (ZETIA) 10 MG tablet Take 10 mg by mouth daily.    . famotidine (PEPCID) 20 MG tablet Take 20 mg by mouth at bedtime.    . mometasone-formoterol (DULERA) 100-5 MCG/ACT AERO Take 2 puffs first thing in am and then another 2 puffs about 12 hours later. 1 Inhaler 11  . topiramate (TOPAMAX) 100 MG tablet Take 1 tablet (100 mg total) by mouth at bedtime. Take one tablet 50mg  at bedtime for 2 weeks then increase to 100mg  at bedtime, 30 tablet 11  . valsartan-hydrochlorothiazide (DIOVAN-HCT) 160-25 MG tablet Take 1 tablet by mouth daily.    Marland Kitchen zolmitriptan (ZOMIG) 5 MG nasal solution Use 1 spray in nostril at onset if headache. May repeat in 2 hours. Max twice daily. 6 Units 11  . methylPREDNISolone (MEDROL DOSEPAK) 4 MG TBPK tablet Take as directed. Take daily pills in the morning with breakfast. 21 tablet 0  . FLUoxetine (PROZAC) 20 MG capsule Take 1 capsule (20 mg total) by mouth daily. 30 capsule 3   No facility-administered medications prior to visit.     PAST MEDICAL HISTORY: Past Medical History:  Diagnosis Date  . Abdominal cramps   . Asthma   . Back problem    BAD DISK IN BACK  . H/O hiatal hernia   . Hyperlipidemia   . Hypertension   . Knee problem   . Leg cramps   . Migraine   . Shortness of breath   . Stroke ([place])   . Ulcer    STOMACH & SORENESS & PAIN  . UTI (urinary tract infection) 07/02/2012    PAST SURGICAL HISTORY: Past Surgical History:  Procedure Laterality Date  . ABDOMINAL HYSTERECTOMY    . HEMORRHOID SURGERY      FAMILY HISTORY: Family History  Problem Relation Age of Onset  . Hypertension Mother   . Hyperlipidemia Mother   . Asthma Father   . Cancer Father   . Hypertension Brother   . Breast cancer Neg Hx     SOCIAL HISTORY: Social History   Socioeconomic History  . Marital status: Married    Spouse name: [name]  . Number of children: 3  . Years of education: Some college  . Highest education level: Not  on file  Occupational History  . Occupation: works full time  Scientific laboratory technician  . Financial resource strain: Not on file  . Food insecurity:    Worry: Not on file    Inability: Not on file  . Transportation needs:    Medical: Not on file    Non-medical: Not on file  Tobacco Use  . Smoking status: Never Smoker  . Smokeless tobacco: Never Used  Substance and Sexual Activity  . Alcohol use: No  . Drug use: No  . Sexual activity: Not on file  Lifestyle  . Physical activity:    Days per week: Not on file    Minutes per session: Not on file  . Stress: Not on file  Relationships  . Social connections:    Talks on phone: Not on file  Gets together: Not on file    Attends religious service: Not on file    Active member of club or organization: Not on file    Attends meetings of clubs or organizations: Not on file    Relationship status: Not on file  . Intimate partner violence:    Fear of current or ex partner: Not on file    Emotionally abused: Not on file    Physically abused: Not on file    Forced sexual activity: Not on file  Other Topics Concern  . Not on file  Social History Narrative   Lives with husband   Caffeine use: Coffee/tea/soda sometimes per patient      PHYSICAL EXAM  Vitals:   06/18/17 1048  BP: 120/81  Pulse: 75  Weight: 207 lb 9.6 oz (94.2 kg)  Height: 5\' 5"  (1.651 m)   Body mass index is 34.55 kg/m.  Generalized: Well developed, in no acute distress   Neurological examination  Mentation: Alert oriented to time, place, history taking. Follows all commands speech and language fluent Cranial nerve II-XII: Pupils were equal round reactive to light. Extraocular movements were full, visual field were full on confrontational test. Facial sensation and strength were normal. Uvula tongue midline. Head turning and shoulder shrug  were normal and symmetric. Motor: The motor testing reveals 5 over 5 strength of all 4 extremities. Good symmetric motor tone is  noted throughout.  Sensory: Sensory testing is intact to soft touch on all 4 extremities. No evidence of extinction is noted.  Coordination: Cerebellar testing reveals good finger-nose-finger and heel-to-shin bilaterally.  Gait and station: Gait is normal. Tandem gait is normal. Romberg is negative. No drift is seen.  Reflexes: Deep tendon reflexes are symmetric and normal bilaterally.   DIAGNOSTIC DATA (LABS, IMAGING, TESTING) - I reviewed patient records, labs, notes, testing and imaging myself where available.  Lab Results  Component Value Date   WBC 5.0 08/31/2015   HGB 12.9 08/31/2015   HCT 39.9 08/31/2015   MCV 91 08/31/2015   PLT 290 08/31/2015      Component Value Date/Time   NA 142 08/31/2015 1313   K 4.5 08/31/2015 1313   CL 101 08/31/2015 1313   CO2 26 08/31/2015 1313   GLUCOSE 82 08/31/2015 1313   GLUCOSE 98 06/18/2013 1844   BUN 12 08/31/2015 1313   CREATININE 1.02 (H) 08/31/2015 1313   CALCIUM 9.6 08/31/2015 1313   PROT 7.1 08/31/2015 1313   ALBUMIN 4.4 08/31/2015 1313   AST 14 08/31/2015 1313   ALT 9 08/31/2015 1313   ALKPHOS 74 08/31/2015 1313   BILITOT 0.3 08/31/2015 1313   GFRNONAA 60 08/31/2015 1313   GFRAA 70 08/31/2015 1313      ASSESSMENT AND PLAN 62 y.o. year old female  has a past medical history of Abdominal cramps, Asthma, Back problem, H/O hiatal hernia, Hyperlipidemia, Hypertension, Knee problem, Leg cramps, Migraine, Shortness of breath, Stroke ([place]), Ulcer, and UTI (urinary tract infection) (07/02/2012). here with:  1.  Migraine headache  The patient will continue on Topamax and Zomig.  We will increase Aimovig to 140 mg injection.  Patient is advised that if this does not offer any additional benefit with her headaches she should let us know.  The prednisone Dosepak has been beneficial for the patient but she continues to have a dull headache.  Today is her last dose of prednisone.  I have advised the patient that she should go home and rest  in a dark  room with little stimulation for the next several hours to see if her headache will fully resolve.  If she continues to have a dull headache she can call and we will consider an infusion.  She will follow-up in 6 months or sooner if needed.    Ward Givens, MSN, NP-C 06/18/2017, 11:21 AM [facility] [address] [place], [place] [address] [phone]

## 2017-06-18 NOTE — Patient Instructions (Signed)
Your Plan:  Continue topamax and Zomig Increase Aimovig 140 mg/51ml monthly injections If your symptoms worsen or you develop new symptoms please let us know.    Thank you for coming to see [place] at [facility]. I hope we have been able to provide you high quality care today.  You may receive a patient satisfaction survey over the next few weeks. We would appreciate your feedback and comments so that we may continue to improve ourselves and the health of our patients.

## 2017-06-19 ENCOUNTER — Telehealth: Payer: Self-pay | Admitting: *Deleted

## 2017-06-19 NOTE — Telephone Encounter (Addendum)
PA approve till 12/31/2017 for Zomig 5mg  nasal spray. PA ws done today.This is per fax.

## 2017-06-19 NOTE — Telephone Encounter (Addendum)
Per CMM- Approved- PA Case: [id number],  Coverage Starts on: 06/19/2017 12:00:00 AM, Coverage Ends on: 12/31/2017 12:00:00 AM. Questions? Contact [phone]. Approval with PA # faxed to [employer], [place].

## 2017-06-19 NOTE — Telephone Encounter (Signed)
Received fax from [place], [place] requiring PA for Zomig. Initiated PA on CMM. Key: [id number] - PA Case ID: [id number]

## 2017-06-24 ENCOUNTER — Telehealth: Payer: Self-pay | Admitting: Adult Health

## 2017-06-24 NOTE — Telephone Encounter (Addendum)
I spoke to pt and she relayed that she went home and darkened room, cool, quiet enviroment and did not help.  She took 140mg  aimovig and this she states has not helped as well.    Today she is c/o Left to frontal head pressure, fullness, feels swollen, just wants to close her eyes.  No nausea, lights and sounds bothersome.  Level 5-7 at this time (jumps up suddenly).  She can get driver to come with her for infusion.  Has not had infusion before.  Please advise.  Allergies:  Morphine related, naproxen, bystolic.

## 2017-06-24 NOTE — Telephone Encounter (Signed)
Pt has called to inform that she has gotten very little relief from the Erenumab-aooe ([place]) 140 MG/ML SOAJand she is asking to be called at [phone]

## 2017-06-24 NOTE — Telephone Encounter (Signed)
Ok to do Depacon IV 500 mg can repeat x1 if needed.

## 2017-06-24 NOTE — Telephone Encounter (Signed)
Spoke to pt and she will come in, no need of driver.  Only depacon IV per MM/NP.  Pt aware.  Had lab work from [place] Dr. Justin Mend, faxed these to [place].  Normal LFT.

## 2017-06-26 NOTE — Progress Notes (Signed)
Personally  participated in, made any corrections needed, and agree with history, physical, neuro exam,assessment and plan as stated above.    Raynetta Osterloh, MD [facility] 

## 2017-06-27 ENCOUNTER — Other Ambulatory Visit: Payer: Self-pay | Admitting: Family Medicine

## 2017-06-27 DIAGNOSIS — R1013 Epigastric pain: Secondary | ICD-10-CM

## 2017-06-27 NOTE — Telephone Encounter (Signed)
Pt called she was not aware this was approved until she rec'd the letter today. Please call to advise

## 2017-06-27 NOTE — Telephone Encounter (Signed)
Called patient to ask if she needed any further assistance. She stated that [place] kept telling her the medication wasn't approved yet. This RN advised her this office received the approval on 06/19/17, and this RN faxed approval to [facility] that same day.  She stated [facility] never notified her of approval.  This RN apologized but stated [facility] should have all the information they need. She verbalized understanding, appreciation.

## 2017-07-09 ENCOUNTER — Ambulatory Visit
Admission: RE | Admit: 2017-07-09 | Discharge: 2017-07-09 | Disposition: A | Discharge status: Home / Self Care | Payer: Medicare PPO | Source: Ambulatory Visit | Attending: Family Medicine | Admitting: Family Medicine

## 2017-07-09 DIAGNOSIS — R1013 Epigastric pain: Secondary | ICD-10-CM

## 2017-08-13 ENCOUNTER — Telehealth: Payer: Self-pay | Admitting: *Deleted

## 2017-08-15 ENCOUNTER — Other Ambulatory Visit: Payer: Self-pay

## 2017-08-15 MED ORDER — ERENUMAB-AOOE 140 MG/ML ~~LOC~~ SOAJ: 140.0000 mg | SUBCUTANEOUS | 3 refills | Status: DC

## 2017-08-15 NOTE — Telephone Encounter (Signed)
.    This was opened in error.

## 2017-08-15 NOTE — Telephone Encounter (Signed)
Refill for Amovig submitted for a 90 day supply to [facility].  MB RN.

## 2017-12-09 DIAGNOSIS — F321 Major depressive disorder, single episode, moderate: Secondary | ICD-10-CM | POA: Diagnosis not present

## 2017-12-09 DIAGNOSIS — E538 Deficiency of other specified B group vitamins: Secondary | ICD-10-CM | POA: Diagnosis not present

## 2017-12-09 DIAGNOSIS — I10 Essential (primary) hypertension: Secondary | ICD-10-CM | POA: Diagnosis not present

## 2017-12-09 DIAGNOSIS — E785 Hyperlipidemia, unspecified: Secondary | ICD-10-CM | POA: Diagnosis not present

## 2017-12-10 ENCOUNTER — Telehealth: Payer: Self-pay | Admitting: *Deleted

## 2017-12-10 NOTE — Telephone Encounter (Signed)
Spoke to pt and relayed that received reauth form for aimovig,70mg /ml and was she using this.  She stated she was and noted decrease in migraines and intensity.  CMM Key [id number].

## 2017-12-11 NOTE — Telephone Encounter (Signed)
I spoke to [place] at [facility].  Pt is on aimovig 140mg  /ml.  PA / reauthorization  # [id number] sent to clinical review. Will take 24-72 hours for result.  [phone], and email [email or web address].  G43.711 diagnosis code.

## 2017-12-13 DIAGNOSIS — E538 Deficiency of other specified B group vitamins: Secondary | ICD-10-CM | POA: Diagnosis not present

## 2017-12-17 NOTE — Telephone Encounter (Signed)
Pt received 3 doses of her aimovig on 11/26/2017.  She does have 2 refills on this prescription.  Reauthorization was denied due asking for more then 3/90 days.  (I believe misunderstood).  Pt is to take 1 injection every 30 days.  Nothing more needed.  Approval for 3/ 90 day is good through 01/01/19.  [employer]  [phone].  I spoke to [place] [phone].

## 2017-12-20 DIAGNOSIS — E538 Deficiency of other specified B group vitamins: Secondary | ICD-10-CM | POA: Diagnosis not present

## 2017-12-23 ENCOUNTER — Other Ambulatory Visit: Payer: Self-pay | Admitting: Family Medicine

## 2017-12-23 DIAGNOSIS — Z1231 Encounter for screening mammogram for malignant neoplasm of breast: Secondary | ICD-10-CM

## 2018-01-01 HISTORY — PX: OTHER SURGICAL HISTORY: SHX169

## 2018-01-03 DIAGNOSIS — E538 Deficiency of other specified B group vitamins: Secondary | ICD-10-CM | POA: Diagnosis not present

## 2018-01-07 DIAGNOSIS — F331 Major depressive disorder, recurrent, moderate: Secondary | ICD-10-CM | POA: Diagnosis not present

## 2018-01-07 DIAGNOSIS — I10 Essential (primary) hypertension: Secondary | ICD-10-CM | POA: Diagnosis not present

## 2018-01-10 DIAGNOSIS — E538 Deficiency of other specified B group vitamins: Secondary | ICD-10-CM | POA: Diagnosis not present

## 2018-01-13 ENCOUNTER — Ambulatory Visit: Payer: Medicare PPO | Admitting: Adult Health

## 2018-01-28 ENCOUNTER — Ambulatory Visit: Payer: Medicare PPO

## 2018-01-31 DIAGNOSIS — K921 Melena: Secondary | ICD-10-CM | POA: Diagnosis not present

## 2018-01-31 DIAGNOSIS — R1013 Epigastric pain: Secondary | ICD-10-CM | POA: Diagnosis not present

## 2018-01-31 DIAGNOSIS — R110 Nausea: Secondary | ICD-10-CM | POA: Diagnosis not present

## 2018-02-03 DIAGNOSIS — K921 Melena: Secondary | ICD-10-CM | POA: Diagnosis not present

## 2018-02-03 DIAGNOSIS — R1084 Generalized abdominal pain: Secondary | ICD-10-CM | POA: Diagnosis not present

## 2018-02-06 DIAGNOSIS — K319 Disease of stomach and duodenum, unspecified: Secondary | ICD-10-CM | POA: Diagnosis not present

## 2018-02-06 DIAGNOSIS — K2950 Unspecified chronic gastritis without bleeding: Secondary | ICD-10-CM | POA: Diagnosis not present

## 2018-02-06 DIAGNOSIS — K3189 Other diseases of stomach and duodenum: Secondary | ICD-10-CM | POA: Diagnosis not present

## 2018-02-06 DIAGNOSIS — R1013 Epigastric pain: Secondary | ICD-10-CM | POA: Diagnosis not present

## 2018-02-06 DIAGNOSIS — K449 Diaphragmatic hernia without obstruction or gangrene: Secondary | ICD-10-CM | POA: Diagnosis not present

## 2018-02-10 ENCOUNTER — Ambulatory Visit
Admission: RE | Admit: 2018-02-10 | Discharge: 2018-02-10 | Disposition: A | Discharge status: Home / Self Care | Payer: Medicare PPO | Source: Ambulatory Visit | Attending: Family Medicine | Admitting: Family Medicine

## 2018-02-10 DIAGNOSIS — Z1231 Encounter for screening mammogram for malignant neoplasm of breast: Secondary | ICD-10-CM | POA: Diagnosis not present

## 2018-02-12 DIAGNOSIS — K319 Disease of stomach and duodenum, unspecified: Secondary | ICD-10-CM | POA: Diagnosis not present

## 2018-02-17 DIAGNOSIS — F331 Major depressive disorder, recurrent, moderate: Secondary | ICD-10-CM | POA: Diagnosis not present

## 2018-03-26 ENCOUNTER — Encounter: Payer: Self-pay | Admitting: *Deleted

## 2018-03-26 ENCOUNTER — Telehealth: Payer: Self-pay | Admitting: *Deleted

## 2018-03-26 NOTE — Progress Notes (Signed)
PATIENT: Savannah Taylor DOB: 09-15-1955  REASON FOR VISIT: follow up HISTORY FROM: patient  Virtual Visit via Telephone Note  I connected with SHERIECE JEFCOAT on 03/27/18 at  2:30 PM EDT by telephone and verified that I am speaking with the correct person using two identifiers.   I discussed the limitations, risks, security and privacy concerns of performing an evaluation and management service by telephone and the availability of in person appointments. I also discussed with the patient that there may be a patient responsible charge related to this service. The patient expressed understanding and agreed to proceed.   History of Present Illness:  03/27/18 Savannah Taylor is a 63 y.o. female for follow up. She is taking topiramate 100mg  daily and Amovig 140mg  monthly for management of migraines.  She reports that her headaches continue to occur daily.  She has mild pain with most headaches but does report at least 10 migraines per month.  Migraines are typically unilateral and associated with nausea, light and sound sensitivity.  Last month she took an extra dose of Aimovig and reports that that had helped some.  She states that the first 2 to 3 weeks after taking Aimovig she feels that she is well managed, however, the next week or so her migraines are pretty bad.  She is taking Zomig for abortive therapy but reports it is no longer helping.  She has been on multiple preventative medications including Topamax, verapamil, amitriptyline, Depakote, Inderal, Imitrex injections, Maxalt, Neurontin and Botox.  She is also tried Effexor and Cymbalta.  Headaches have not changed in nature.  Last imaging reviewed was a CT of her head in 2014 that was unremarkable.  She reports about 10 years ago she had a work-up for sleep apnea.  She is unsure of the results of her test but does mention that she was told if she ever reaches a deep stage of sleep.  She does endorse snoring, frequent waking, nocturia,  dry mouth, occasional morning headaches, daytime sleepiness and fatigue.  She is interested in an updated sleep study.   History (copied from [employer] note dated 06/18/17)   [name] is a 63 year old female with a history of migraine.  She states that she had a severe headache for the last 2 to 3 weeks.  She is on last day of her prednisone Dosepak.  Reports prednisone has been beneficial.  Reports that she still has a mild headache but not as severe as it was before.  She states that her headaches typically occur on the left side.  She always has phonophobia with her headaches.  She remains on Topamax.  She also uses Zomig as needed.  She reports that [place] worked well for her migraines in the first 2 months.  She states by the third month she began having a constant dull headache.  She is currently taking the 70 mg injection.  She returns today for evaluation.  HISTORY:Savannah Taylor a 63 y.o.femalehere as a referral from Dr. Bonnita Nasuti migraines.Past medical history of chronic migraines, depression, hypertension, high cholesterol,B12 deficiency, chronic insomnia, chronic kidney disease, multilevel degenerative disease.She has been seeing Dr. Domingo Cocking for nerve injections. Migraines since a teenager without any inciting event or head trauma.She has a family history of migraines,Her son and paternal aunt have migraines. She has been having injectionsof the headache wellness Center,she went every month and then every 2 months(likely nerve block)but she also says that she received Botox which helped but insurance would not  pay anymore. Now she just goes as needed.For nerve blocks.She hasheadaches15 days a month. 8-10 are migrainous.This is been ongoing for the last 6 months.Her migraines feele like her head isswelling on one side, but they can move around, one eye droops, feels tight, she can hear her heart beating in her head especially when laying down, more pounding than  pressure, she can't stand sound or noise. Dark rooms help. Being quiet helps. They can last for days straight. They can be severe with nausea and vomiting on average out of 10 can be 6-7/10. She has been to "so many doctors and so many places" and the doctors get frustrated because they can't help. She has been to headaches clinics.She was told she was faking in the past. She has tried everything per patient. No other associated symptoms or focal neurologic deficits or complaints. No aura. No medication overuse.  Tried: Topamax, verapamil, amitriptyline, depakote, inderal, she has had imitrex injections, maxalt, zomig. Neurontin, botox helped a lot but then her physician said insurance wouldn;t pay, effexor, cymbalta.   Reviewed notes, labs and imaging from outside physicians, which showed:  BUN 12 and creatinine 1.15 taken 04/05/2015. TSH 0.810 January 2017.  CT of the head 4/2014showed No acute intracranial abnormalities including mass lesion or mass effect, hydrocephalus, extra-axial fluid collection, midline shift, hemorrhage, or acute infarction, large ischemic events (personally reviewed images)   Observations/Objective:  Generalized: Well developed, in no acute distress  Mentation: Alert oriented to time, place, history taking. Follows all commands speech and language fluent   Assessment and Plan:  63 y.o. year old female  has a past medical history of Abdominal cramps, Asthma, Back problem, Gastritis, H/O hiatal hernia, Hyperlipidemia, Hypertension, Knee problem, Leg cramps, Migraine, Shortness of breath, Stroke ([place]), Ulcer, and UTI (urinary tract infection) (07/02/2012).  with    ICD-10-CM   1. Chronic migraine w/o aura w/o status migrainosus, not intractable G43.709 Ambulatory referral to Sleep Studies  2. Daytime sleepiness R40.0 Ambulatory referral to Sleep Studies  3. Uncontrolled morning headache R51 Ambulatory referral to Sleep Studies   Unfortunately, Savannah Taylor  continues to battle with daily headaches and frequent migraines.  After further review I am concerned about potential sleep apnea.  I am unable to locate her last sleep study.  I do feel that a referral to sleep medicine is indicated.  I have discussed this with Francesa she is in agreement.  I will attempt to get Roselyn Meier covered for abortive therapy.  She has tried and failed Imitrex, Maxalt, Zomig and over-the-counter medications.  She denies any current use of over-the-counter analgesics.  We will follow-up closely pending sleep referral to determine next dose of treatment.  She verbalizes understanding and agreement with the plan.  Orders Placed This Encounter  Procedures   Ambulatory referral to Sleep Studies    Referral Priority:   Routine    Referral Type:   Consultation    Referral Reason:   Specialty Services Required    Number of Visits Requested:   1    Meds ordered this encounter  Medications   Ubrogepant (UBRELVY) 50 MG TABS    Sig: Take 50 mg by mouth daily as needed. Take 1 tablet at onset of headache, may repeat 1 tablet in 2 hours, no more than 2 tablets in 24 hours    Dispense:  10 tablet    Refill:  11    Order Specific Question:   Supervising Provider    Answer:   Melvenia Beam [[id number]]  Follow Up Instructions:  I discussed the assessment and treatment plan with the patient. The patient was provided an opportunity to ask questions and all were answered. The patient agreed with the plan and demonstrated an understanding of the instructions.   The patient was advised to call back or seek an in-person evaluation if the symptoms worsen or if the condition fails to improve as anticipated.  I provided 25 minutes of non-face-to-face time during this encounter.  During telephone visit patient is located at her place of residence, provider located at her place of residence.  Hendricks Milo, RN facilitated visit.   Debbora Presto, NP

## 2018-03-26 NOTE — Telephone Encounter (Signed)
I spoke with the patient and discussed that d/t covid 19 pandemic,our office is severely reducing in person visits for at least the next two weeks, in order to minimize the risk to our patients and healthcare providers. Advised that we recommend patient convert to a telemedicine visit. Although there may be some limitations with this type of visit, we will take all precautions to reduce any security or privacy concerns. This will be treated like an in-office visit and we will file with your insurance, and there may be a patient responsible charge related to this service.   Patient gave consent to do a telephone call visit with Amy NP that will be filed to her insurance. I scheduled her appt for 2:30 pm on 3/26. Pt requested we call her cell phone. I also reviewed her med/sx/fam/social hx, meds & allergies during this call. Pt verbalized appreciation.

## 2018-03-27 ENCOUNTER — Ambulatory Visit (INDEPENDENT_AMBULATORY_CARE_PROVIDER_SITE_OTHER): Payer: Medicare PPO | Admitting: Family Medicine

## 2018-03-27 ENCOUNTER — Telehealth: Payer: Self-pay

## 2018-03-27 ENCOUNTER — Encounter: Payer: Self-pay | Admitting: Family Medicine

## 2018-03-27 ENCOUNTER — Other Ambulatory Visit: Payer: Self-pay

## 2018-03-27 DIAGNOSIS — G43709 Chronic migraine without aura, not intractable, without status migrainosus: Secondary | ICD-10-CM | POA: Diagnosis not present

## 2018-03-27 DIAGNOSIS — R400 Somnolence: Secondary | ICD-10-CM | POA: Diagnosis not present

## 2018-03-27 DIAGNOSIS — R51 Headache: Secondary | ICD-10-CM | POA: Diagnosis not present

## 2018-03-27 DIAGNOSIS — R519 Headache, unspecified: Secondary | ICD-10-CM

## 2018-03-27 MED ORDER — UBROGEPANT 50 MG PO TABS: 50.0000 mg | ORAL_TABLET | Freq: Every day | ORAL | 11 refills | Status: DC | PRN

## 2018-03-27 NOTE — Telephone Encounter (Signed)
Pending approval for Ubrelvy 50 mg tablets [id number] Rx #- [id number] ICD 10- G43.709  [employer] NCPDP 2017 typically responds with questions in less than 15 minutes, but may take up to 24 hours.   I will continue the PA once the clinical questions populate.

## 2018-03-30 NOTE — Progress Notes (Signed)
Made any corrections needed, and agree with history, physical, neuro exam,assessment and plan as stated.     Antonia Ahern, MD [facility]  

## 2018-04-08 DIAGNOSIS — F331 Major depressive disorder, recurrent, moderate: Secondary | ICD-10-CM | POA: Diagnosis not present

## 2018-05-01 ENCOUNTER — Telehealth: Payer: Self-pay | Admitting: Family Medicine

## 2018-05-01 NOTE — Telephone Encounter (Signed)
Pt called stating that she would like to try Ubrelvy for her headaches due to her insurance not wanting to cover the medications that she is on at the moment. Please advise.

## 2018-05-01 NOTE — Telephone Encounter (Signed)
I called in Ubrelvy at her visit on 3/26. She should have the rx waiting for her at the pharmacy.

## 2018-05-01 NOTE — Telephone Encounter (Signed)
Left a voicemail letting her know that her prescription for Roselyn Meier has been called into her pharmacy. Office number provided in case she has any further questions.

## 2018-05-02 ENCOUNTER — Encounter: Payer: Self-pay | Admitting: Internal Medicine

## 2018-05-02 ENCOUNTER — Ambulatory Visit (INDEPENDENT_AMBULATORY_CARE_PROVIDER_SITE_OTHER): Payer: Medicare PPO | Admitting: Internal Medicine

## 2018-05-02 ENCOUNTER — Other Ambulatory Visit: Payer: Self-pay

## 2018-05-02 DIAGNOSIS — J45991 Cough variant asthma: Secondary | ICD-10-CM | POA: Diagnosis not present

## 2018-05-02 MED ORDER — ALBUTEROL SULFATE HFA 108 (90 BASE) MCG/ACT IN AERS: 2.0000 | INHALATION_SPRAY | RESPIRATORY_TRACT | 1 refills | Status: DC | PRN

## 2018-05-02 MED ORDER — MOMETASONE FURO-FORMOTEROL FUM 100-5 MCG/ACT IN AERO: INHALATION_SPRAY | RESPIRATORY_TRACT | 11 refills | Status: DC

## 2018-05-02 NOTE — Progress Notes (Signed)
Subjective:    Patient ID: Savannah Taylor, female    DOB: 1955-03-10,     MRN: [id number]     Brief patient profile:  89 yobf former ward secretary at [employer] never smoker some sinus problems with hay fever but never sob outgrew at age 63's until around 2008 onset of sob with  wheezing referred 09/04/2011 to Pulmonary clinic by Dr Marliss Czar.  W/u  - PFT's 08/03/11 FEV1 2.77 (90%) ratio 79 no change p B2, DLCO 71 corrects to 107    - Trial off coreg > bystolic 20 mg starting 06/08/87> no better    - 09/04/2011   Walked RA x one lap @ 185 stopped due to  Sob, no desat    History of Present Illness  08/19/2015  Consultation/ Savannah Taylor re prob cough variant asthma  Chief Complaint  Patient presents with  . Advice Only    referred by Dr. Justin Mend for SOB.  l/s 2013.    extremely confusing hx, being seen by multiple providers not in epic each telling her something different and hard to get her symptoms as dwells on various non-specific dx's like "bronchitis" and "pulmonary artery narrowing" but  Def  Gradually worse cough /sob off albuterol and symbicort but not really better on it either (and not clear whether she was taking symbicort when she underwent last set of pfts ) with symptoms dating back to 2013 but present since 6 months rec Pantoprazole (protonix) 40 mg   Take  30-60 min before first meal of the day and Pepcid (famotidine)  20 mg one @  bedtime until return to office - this is the best way to tell whether stomach acid is contributing to your problem.   GERD  Diet   Plan A = Automatic = Dulera 100 Take 2 puffs first thing in am and then another 2 puffs about 12 hours later.  Work on Orthoptist B = Backup Only use your albuterol as a rescue medication Please remember to go to the lab  department downstairs for your tests - we will call you with the results when they are available. 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 Follow-up    Breathing has improved some. She states that she still feels like something is stuck in her chest when she takes in a breath.   still cough with breath in, still feel like something stuck in throat   Sleeping fine / no noct spells/ no need for saba  Only used one dulera 100 x 15 day sample since last ov  rec Change neurontin to take 600mg  twice daily  Continue dulera 100 Take 1 puffs first thing in am and then another 1 puffs about 12 hours later.  Only use your albuterol as a rescue medication GERD   Please schedule a follow up office visit in 4 weeks, sooner if needed bring all inhalers and medications    10/19/2015  f/u ov/Savannah Taylor re: cough variant asthma/ dulera 100 one bid and proair once since last ov  Chief Complaint  Patient presents with  . Follow-up    Breathing is unchanged. She has only been using her Dulera prn and has used approx 3 since her last visit here.   dulera 100 taking one twice daily /  and rare need for rescue  Gabapentin 600 bid  Overall much better than baseline, still lump in throat sensation but not interfering with sleep/ swallowing  rec Continue medications as they are Please see patient coordinator before you leave today  to schedule ent eval for the lump in your throat - did not do      02/23/2016  f/u ov/Savannah Taylor re: cough variant asthma/ still on dulera 100 2bid (instruction was one bid )  Chief Complaint  Patient presents with  . Follow-up    Still has minimal cough.  No new co's today.    worse lump in throat with dulera 100 2bid  but better breathing, did not follow instructions re try  one bid  Not limited by breathing from desired activities  / no need for saba  rec  dulera 100 should be one twice daily  - if doing worse and need for albuterol then the dose the dulera 2 every 12hours rec ENT eval  And seen 03/14/16        04/29/2017  f/u ov/Savannah Taylor re: cough variant asthma / variably using dulera  Chief Complaint  Patient presents with  . Follow-up    Breathing is overall doing well. She is wheezing more over the past few wks. She did have to use the [facility] today. She typically uses this about 2 x per month. She never uses her albuterol.     had done well x months off dulera then 3 weeks prior to OV due to wheezing  reStarted dulera 100  2 puffs each am   Then some better so changed to  one pff bid (not the instructions given)  Dyspnea:  Not limited by breathing from desired activities   Cough: better now Sleep: ok  SABA use:  Very rare  Tried off pepcid x 3 weeks x 2 m prior to OV  > worse so resumed each am dosing rec No change rx   Virtual Visit via Telephone Note 05/02/2018 re cough variant asthma  I connected with Savannah Taylor on 05/02/18 at  1:30PM EDT by telephone and verified that I am speaking with the correct person using two identifiers.   I discussed the limitations, risks, security and privacy concerns of performing an evaluation and management service by telephone and the availability of in person appointments. I also discussed with the patient that there may be a patient responsible charge related to this service. The patient expressed understanding and agreed to proceed.   History of Present Illness: Dulera 100 1-2 pffs every weeks / proair rarely - forgot contingency on how to max dulera in event of flare Dyspnea:  MMRC1 = can walk nl pace, flat grade, can't hurry or go uphills or steps s sob   Cough: no  Sleeping: flat / one pillows  SABA use: rarely   02: no  No longer needing pepcid    No obvious day to day or daytime variability or assoc excess/ purulent sputum or mucus plugs or hemoptysis or cp or chest tightness, subjective wheeze or overt sinus or hb symptoms.    Also denies any obvious fluctuation of symptoms with weather or environmental changes or other aggravating  or alleviating factors except as outlined above.   Meds reviewed/ med reconciliation completed       Observations/Objective: Sounds great, very upbeat and speaking in full sentences   Assessment and Plan: See problem list for active a/p's   Follow Up Instructions: See avs for instructions unique  to this ov which includes revised/ updated med list     I discussed the assessment and treatment plan with the patient. The patient was provided an opportunity to ask questions and all were answered. The patient agreed with the plan and demonstrated an understanding of the instructions.   The patient was advised to call back or seek an in-person evaluation if the symptoms worsen or if the condition fails to improve as anticipated.  I provided 25 minutes of non-face-to-face time during this encounter.   Christinia Gully, MD

## 2018-05-02 NOTE — Assessment & Plan Note (Signed)
Onset 2008  PFTs 07/13/15  wnl  X  for minimal nonspecific decrease in dlco and erv 17%  FENO 08/19/2015  =  21 off all rx  08/19/2015    rechallenge with dulera 100 2bid x 4 week sample then ov  > only used one 15 day sample 09/21/2015  - Allergy profile 08/19/15 >  Eos 0.4 /  IgE  595 Pos RAST Dog >> cockroach > dust  - 09/21/2015   rechallenge with dulera 100 one bid  - FENO 10/03/2016  =   20 off all rx x > 72 hours  - 10/03/2016  After extensive coaching HFA effectiveness =    90% > continue dulera 1-2 bid "prn"    On very low doses of dulera 100 >>> All goals of chronic asthma control met including optimal function and elimination of symptoms with minimal need for rescue therapy.  Contingencies discussed in full including contacting this office immediately if not controlling the symptoms using the rule of two's.      Advised ok to taper dulera 100 down Based on two studies from NEJM  378; 20 p 1865 (2018) and 380 : p2020-30 (2019) in pts with mild asthma it is reasonable to use low dose symbicort eg 80(and by inference dulera 100) 2bid "prn" flare in this setting but I emphasized this was only shown with symbicort and takes advantage of the rapid onset of action but is not the same as "rescue therapy" but can be stopped once the acute symptoms have resolved and the need for rescue has been minimized (< 2 x weekly)    Reminded to "push the petal to the metal" in event of any flare for any reason = dulera 100 2bid and then once all better taper down to the lowest dose that works    F/u in 12 m, sooner prn

## 2018-05-02 NOTE — Patient Instructions (Signed)
Plan A = Automatic = dulera 100 up to 2 puff every q 12 hours but if feeling great taper down to the lowest dose   Plan B = Backup Only use your albuterol(proair) inhaler as a rescue medication to be used if you can't catch your breath by resting or doing a relaxed purse lip breathing pattern.  - The less you use it, the better it will work when you need it. - Ok to use the inhaler up to 2 puffs  every 4 hours if you must but call for appointment if use goes up over your usual need - Don't leave home without it !!  (think of it like the spare tire for your car)     Please schedule a follow up visit in 12  months but call sooner if needed

## 2018-05-02 NOTE — Telephone Encounter (Signed)
Pt called in and stated the pharmacy states they havent received anything

## 2018-05-05 ENCOUNTER — Other Ambulatory Visit: Payer: Self-pay | Admitting: Family Medicine

## 2018-05-05 MED ORDER — UBROGEPANT 50 MG PO TABS: 50.0000 mg | ORAL_TABLET | Freq: Every day | ORAL | 11 refills | Status: DC | PRN

## 2018-05-05 NOTE — Telephone Encounter (Signed)
I have resent rx. Please let me know if she continues to have trouble.

## 2018-05-08 ENCOUNTER — Telehealth: Payer: Self-pay

## 2018-05-08 ENCOUNTER — Telehealth: Payer: Self-pay | Admitting: Internal Medicine

## 2018-05-08 NOTE — Telephone Encounter (Signed)
Pt called back and states that the pharmacy informed her that they sent back the RX because they are needing a PA from the insurance. Please advise.

## 2018-05-08 NOTE — Telephone Encounter (Signed)
Medication name and strength: Dulera 100-5 mcg Provider: [facility] Pharmacy: [facility] [address] Patient insurance ID: [id number] Phone: [phone] Fax: [phone]  Was the PA started on CMM?  Yes If yes, please enter the Key: [id number] Timeframe for approval/denial: 24-72hrs  Will route to [place] for follow up

## 2018-05-08 NOTE — Telephone Encounter (Signed)
PA is pending clinical question which can take up to 24 hours. Once the questions have been populated I will answer them. I will update accordingly

## 2018-05-08 NOTE — Telephone Encounter (Signed)
Pending approval for Ubrelvy 50 mg tablets Key: [id number]   [facility] NCPDP 2017 typically responds with questions in less than 15 minutes, but may take up to 24 hours.   I will check back periodically and update accordingly

## 2018-05-12 NOTE — Telephone Encounter (Signed)
KeyCarlean Jews - PA Case ID: [id number] - Rx #: [id number]  Need help? Call us at [phone]   Outcome; Denied on May 8 The drug you asked for is non-formulary (not on [employer] list of preferred drugs). Before the drug can be covered, we need more information. Please ask your prescriber to explain to [facility] why the preferred drugs have not worked for your medical condition and/or would have bad side effects. The list of drugs offered by your [facility] plan can be found in your Prescription Drug Guide (PDG). You can view your PDG online at [email or web address] or request a printed list be mailed to you from this website or by calling customer care at [phone] (TTY: [phone]), 8 a.m. 8 p.m., seven days a week. If you have not tried the preferred drugs, including Symbicort HFA aerosol inhaler and Breo Ellipta powder for inhalation, please talk to your health care provider about prescribing one of these for you. Some preferred drugs may require an additional review and approval by [facility]. This determination was based on the [employer] Non-Formulary Exceptions Coverage Policy.  Appeal will be sent to [facility]

## 2018-05-13 NOTE — Telephone Encounter (Signed)
Per CMM, the appeal has been approved. PA is now approved until 01/01/2019. Will call [facility] to let them know.   Nothing further needed at time of call.

## 2018-05-14 NOTE — Telephone Encounter (Signed)
Pt has called for an update /status of Pending approval for Ubrelvy 50 mg tablets.  Please call

## 2018-05-14 NOTE — Telephone Encounter (Signed)
Check with cover my meds.[employer] was unable to provide clinical questions. Savannah Taylor with covermymeds is faxing over a hard copy of the clinical questions. Office fax number was provided.

## 2018-05-15 NOTE — Telephone Encounter (Signed)
Clinical questions have been signed by Dr. Jaynee Eagles and faxed back to [facility]. Confirmation fax has been received.   [employer] Tel: [phone] Fax: [phone]

## 2018-05-15 NOTE — Telephone Encounter (Signed)
Clinical questions have been answered and placed waiting for Dr. Jaynee Eagles signature.

## 2018-05-20 NOTE — Telephone Encounter (Signed)
See below note.

## 2018-05-20 NOTE — Telephone Encounter (Signed)
Please try to appeal. She has tried and failed Imitrex, Maxalt and Zomig in the past. She has history of CVA and gastric ulcers/gastritis so I do not want to try other triptans or Nsaids. Keep me posted on progress.

## 2018-05-20 NOTE — Telephone Encounter (Signed)
Received a denial from [facility] stating that Savannah Taylor is not on there preferred drug. Should we do an appeal. Please advise.

## 2018-05-21 ENCOUNTER — Telehealth: Payer: Self-pay

## 2018-05-21 NOTE — Telephone Encounter (Signed)
Follow up has been scheduled. Patient is aware of appt day and time.   

## 2018-05-29 NOTE — Telephone Encounter (Signed)
Appeal has been done and faxed on covermymeds.  Key: [id number]  I will update once receive a decision from her appeal.

## 2018-06-02 ENCOUNTER — Telehealth: Payer: Self-pay

## 2018-06-02 NOTE — Telephone Encounter (Signed)
Unable to get in contact with the patient to r/s and convert their office visit into a doxy.me visit with Amy. I left voicemail asking the patient to return my call. Office number was provided.  If patient calls back please r/s and convert their visit into a doxy.me visit.

## 2018-06-02 NOTE — Telephone Encounter (Signed)
Received an approval for the Ubrelvy 50 mg tablet. APPROVED from 05/30/2018-01/01/2019. A copy of the approval has been sent to the patient's pharmacy listed below. Confirmation has been received.   [place], [place] - [address] [phone] (Phone) [phone] (Fax)

## 2018-06-07 ENCOUNTER — Other Ambulatory Visit: Payer: Self-pay

## 2018-06-07 DIAGNOSIS — Z20822 Contact with and (suspected) exposure to covid-19: Secondary | ICD-10-CM

## 2018-06-09 LAB — NOVEL CORONAVIRUS, NAA: SARS-CoV-2, NAA: NOT DETECTED

## 2018-06-23 NOTE — Telephone Encounter (Signed)
Patient called inquire if this has been sent to her pharmacy. I informed her of message below.

## 2018-06-27 ENCOUNTER — Ambulatory Visit: Payer: Self-pay | Admitting: Family Medicine

## 2018-07-04 ENCOUNTER — Other Ambulatory Visit: Payer: Self-pay | Admitting: Adult Health

## 2018-07-12 ENCOUNTER — Other Ambulatory Visit: Payer: Self-pay

## 2018-07-12 DIAGNOSIS — Z20822 Contact with and (suspected) exposure to covid-19: Secondary | ICD-10-CM

## 2018-07-18 DIAGNOSIS — M47816 Spondylosis without myelopathy or radiculopathy, lumbar region: Secondary | ICD-10-CM | POA: Diagnosis not present

## 2018-07-18 LAB — NOVEL CORONAVIRUS, NAA: SARS-CoV-2, NAA: NOT DETECTED

## 2018-07-21 ENCOUNTER — Telehealth: Payer: Self-pay | Admitting: General Practice

## 2018-07-21 NOTE — Telephone Encounter (Signed)
Patient informed of COVID results and advised patient to log into My Chart, patient voice understanding.

## 2018-07-30 ENCOUNTER — Other Ambulatory Visit: Payer: Self-pay

## 2018-07-30 ENCOUNTER — Ambulatory Visit (INDEPENDENT_AMBULATORY_CARE_PROVIDER_SITE_OTHER): Payer: Medicare PPO | Admitting: Family Medicine

## 2018-07-30 ENCOUNTER — Encounter: Payer: Self-pay | Admitting: Family Medicine

## 2018-07-30 VITALS — BP 122/78 | HR 86 | Temp 98.4°F | Ht 65.0 in | Wt 212.8 lb

## 2018-07-30 DIAGNOSIS — G43709 Chronic migraine without aura, not intractable, without status migrainosus: Secondary | ICD-10-CM

## 2018-07-30 MED ORDER — VENLAFAXINE HCL ER 75 MG PO CP24: 75.0000 mg | ORAL_CAPSULE | Freq: Every day | ORAL | 3 refills | Status: DC

## 2018-07-30 NOTE — Patient Instructions (Addendum)
Please consider sleep apnea evaluation  Continue Amovig, topiramate and Ubrelvy as prescribed  take venlafaxine 75mg  daily (not as needed)  Update eye exam   Follow up with me following sleep apnea evaluation   Migraine Headache A migraine headache is a very strong throbbing pain on one side or both sides of your head. This type of headache can also cause other symptoms. It can last from 4 hours to 3 days. Talk with your doctor about what things may bring on (trigger) this condition. What are the causes? The exact cause of this condition is not known. This condition may be triggered or caused by:  Drinking alcohol.  Smoking.  Taking medicines, such as: ? Medicine used to treat chest pain (nitroglycerin). ? Birth control pills. ? Estrogen. ? Some blood pressure medicines.  Eating or drinking certain products.  Doing physical activity. Other things that may trigger a migraine headache include:  Having a menstrual period.  Pregnancy.  Hunger.  Stress.  Not getting enough sleep or getting too much sleep.  Weather changes.  Tiredness (fatigue). What increases the risk?  Being 65-74 years old.  Being female.  Having a family history of migraine headaches.  Being Caucasian.  Having depression or anxiety.  Being very overweight. What are the signs or symptoms?  A throbbing pain. This pain may: ? Happen in any area of the head, such as on one side or both sides. ? Make it hard to do daily activities. ? Get worse with physical activity. ? Get worse around bright lights or loud noises.  Other symptoms may include: ? Feeling sick to your stomach (nauseous). ? Vomiting. ? Dizziness. ? Being sensitive to bright lights, loud noises, or smells.  Before you get a migraine headache, you may get warning signs (an aura). An aura may include: ? Seeing flashing lights or having blind spots. ? Seeing bright spots, halos, or zigzag lines. ? Having tunnel vision or  blurred vision. ? Having numbness or a tingling feeling. ? Having trouble talking. ? Having weak muscles.  Some people have symptoms after a migraine headache (postdromal phase), such as: ? Tiredness. ? Trouble thinking (concentrating). How is this treated?  Taking medicines that: ? Relieve pain. ? Relieve the feeling of being sick to your stomach. ? Prevent migraine headaches.  Treatment may also include: ? Having acupuncture. ? Avoiding foods that bring on migraine headaches. ? Learning ways to control your body functions (biofeedback). ? Therapy to help you know and deal with negative thoughts (cognitive behavioral therapy). Follow these instructions at home: Medicines  Take over-the-counter and prescription medicines only as told by your doctor.  Ask your doctor if the medicine prescribed to you: ? Requires you to avoid driving or using heavy machinery. ? Can cause trouble pooping (constipation). You may need to take these steps to prevent or treat trouble pooping:  Drink enough fluid to keep your pee (urine) pale yellow.  Take over-the-counter or prescription medicines.  Eat foods that are high in fiber. These include beans, whole grains, and fresh fruits and vegetables.  Limit foods that are high in fat and sugar. These include fried or sweet foods. Lifestyle  Do not drink alcohol.  Do not use any products that contain nicotine or tobacco, such as cigarettes, e-cigarettes, and chewing tobacco. If you need help quitting, ask your doctor.  Get at least 8 hours of sleep every night.  Limit and deal with stress. General instructions      Keep a journal  to find out what may bring on your migraine headaches. For example, write down: ? What you eat and drink. ? How much sleep you get. ? Any change in what you eat or drink. ? Any change in your medicines.  If you have a migraine headache: ? Avoid things that make your symptoms worse, such as bright lights. ? It  may help to lie down in a dark, quiet room. ? Do not drive or use heavy machinery. ? Ask your doctor what activities are safe for you.  Keep all follow-up visits as told by your doctor. This is important. Contact a doctor if:  You get a migraine headache that is different or worse than others you have had.  You have more than 15 headache days in one month. Get help right away if:  Your migraine headache gets very bad.  Your migraine headache lasts longer than 72 hours.  You have a fever.  You have a stiff neck.  You have trouble seeing.  Your muscles feel weak or like you cannot control them.  You start to lose your balance a lot.  You start to have trouble walking.  You pass out (faint).  You have a seizure. Summary  A migraine headache is a very strong throbbing pain on one side or both sides of your head. These headaches can also cause other symptoms.  This condition may be treated with medicines and changes to your lifestyle.  Keep a journal to find out what may bring on your migraine headaches.  Contact a doctor if you get a migraine headache that is different or worse than others you have had.  Contact your doctor if you have more than 15 headache days in a month. This information is not intended to replace advice given to you by your health care provider. Make sure you discuss any questions you have with your health care provider. Document Released: 09/27/2007 Document Revised: 04/11/2018 Document Reviewed: 01/30/2018 Elsevier Patient Education  [place]. Sleep Apnea Sleep apnea affects breathing during sleep. It causes breathing to stop for a short time or to become shallow. It can also increase the risk of:  Heart attack.  Stroke.  Being very overweight (obese).  Diabetes.  Heart failure.  Irregular heartbeat. The goal of treatment is to help you breathe normally again. What are the causes? There are three kinds of sleep apnea:   Obstructive sleep apnea. This is caused by a blocked or collapsed airway.  Central sleep apnea. This happens when the brain does not send the right signals to the muscles that control breathing.  Mixed sleep apnea. This is a combination of obstructive and central sleep apnea. The most common cause of this condition is a collapsed or blocked airway. This can happen if:  Your throat muscles are too relaxed.  Your tongue and tonsils are too large.  You are overweight.  Your airway is too small. What increases the risk?  Being overweight.  Smoking.  Having a small airway.  Being older.  Being female.  Drinking alcohol.  Taking medicines to calm yourself (sedatives or tranquilizers).  Having family members with the condition. What are the signs or symptoms?  Trouble staying asleep.  Being sleepy or tired during the day.  Getting angry a lot.  Loud snoring.  Headaches in the morning.  Not being able to focus your mind (concentrate).  Forgetting things.  Less interest in sex.  Mood swings.  Personality changes.  Feelings of sadness (depression).  Waking up a lot during the night to pee (urinate).  Dry mouth.  Sore throat. How is this diagnosed?  Your medical history.  A physical exam.  A test that is done when you are sleeping (sleep study). The test is most often done in a sleep lab but may also be done at home. How is this treated?   Sleeping on your side.  Using a medicine to get rid of mucus in your nose (decongestant).  Avoiding the use of alcohol, medicines to help you relax, or certain pain medicines (narcotics).  Losing weight, if needed.  Changing your diet.  Not smoking.  Using a machine to open your airway while you sleep, such as: ? An oral appliance. This is a mouthpiece that shifts your lower jaw forward. ? A CPAP device. This device blows air through a mask when you breathe out (exhale). ? An EPAP device. This has valves that  you put in each nostril. ? A BPAP device. This device blows air through a mask when you breathe in (inhale) and breathe out.  Having surgery if other treatments do not work. It is important to get treatment for sleep apnea. Without treatment, it can lead to:  High blood pressure.  Coronary artery disease.  In men, not being able to have an erection (impotence).  Reduced thinking ability. Follow these instructions at home: Lifestyle  Make changes that your doctor recommends.  Eat a healthy diet.  Lose weight if needed.  Avoid alcohol, medicines to help you relax, and some pain medicines.  Do not use any products that contain nicotine or tobacco, such as cigarettes, e-cigarettes, and chewing tobacco. If you need help quitting, ask your doctor. General instructions  Take over-the-counter and prescription medicines only as told by your doctor.  If you were given a machine to use while you sleep, use it only as told by your doctor.  If you are having surgery, make sure to tell your doctor you have sleep apnea. You may need to bring your device with you.  Keep all follow-up visits as told by your doctor. This is important. Contact a doctor if:  The machine that you were given to use during sleep bothers you or does not seem to be working.  You do not get better.  You get worse. Get help right away if:  Your chest hurts.  You have trouble breathing in enough air.  You have an uncomfortable feeling in your back, arms, or stomach.  You have trouble talking.  One side of your body feels weak.  A part of your face is hanging down. These symptoms may be an emergency. Do not wait to see if the symptoms will go away. Get medical help right away. Call your local emergency services (911 in the [place] Do not drive yourself to the hospital. Summary  This condition affects breathing during sleep.  The most common cause is a collapsed or blocked airway.  The goal of treatment  is to help you breathe normally while you sleep. This information is not intended to replace advice given to you by your health care provider. Make sure you discuss any questions you have with your health care provider. Document Released: 09/27/2007 Document Revised: 10/04/2017 Document Reviewed: 08/13/2017 Elsevier Patient Education  2020 [employer].

## 2018-07-30 NOTE — Progress Notes (Signed)
Made any corrections needed, and agree with history, physical, neuro exam,assessment and plan as stated.     Channon Brougher, MD [facility]  

## 2018-07-30 NOTE — Progress Notes (Signed)
PATIENT: Savannah Taylor DOB: November 12, 1955  REASON FOR VISIT: follow up HISTORY FROM: patient  Chief Complaint  Patient presents with   Follow-up    3 mon f/u. Alone. Rm 5. No new concerns at this time.      HISTORY OF PRESENT ILLNESS: Today 07/30/18 Savannah Taylor is a 63 y.o. female here today for follow up for migraines and concerns of sleep apnea. She reports that headaches are unchanged. She continues to have frequent migraines but feels they are not as intense. She is caring for her mother with end stage dementia and cancer. She is taking venlafaxine 75mg  sporadically when she feels she needs it. She has taken Ubrelvy once but is insure if it helped.   She has not followed through with sleep apnea evaluation. She was uncertain if insurance would cover it. She is interested in continuing with evaluation.   HISTORY: (copied from my note on 03/27/2018)  Savannah Taylor is a 63 y.o. female for follow up. She is taking topiramate 100mg  daily and Amovig 140mg  monthly for management of migraines.  She reports that her headaches continue to occur daily.  She has mild pain with most headaches but does report at least 10 migraines per month.  Migraines are typically unilateral and associated with nausea, light and sound sensitivity.  Last month she took an extra dose of Aimovig and reports that that had helped some.  She states that the first 2 to 3 weeks after taking Aimovig she feels that she is well managed, however, the next week or so her migraines are pretty bad.  She is taking Zomig for abortive therapy but reports it is no longer helping.  She has been on multiple preventative medications including Topamax, verapamil, amitriptyline, Depakote, Inderal, Imitrex injections, Maxalt, Neurontin and Botox.  She is also tried Effexor and Cymbalta.  Headaches have not changed in nature.  Last imaging reviewed was a CT of her head in 2014 that was unremarkable.  She reports about 10 years ago  she had a work-up for sleep apnea.  She is unsure of the results of her test but does mention that she was told if she ever reaches a deep stage of sleep.  She does endorse snoring, frequent waking, nocturia, dry mouth, occasional morning headaches, daytime sleepiness and fatigue.  She is interested in an updated sleep study.   History (copied from [employer] note dated 06/18/17)  Savannah Taylor a 63 year old female with a history of migraine. She states that she had a severe headache for the last 2 to 3 weeks. She is on last day of her prednisone Dosepak. Reports prednisone has been beneficial. Reports that she still has a mild headache but not as severe as it was before. She states that her headaches typically occur on the left side. She always has phonophobia with her headaches. She remains on Topamax. She also uses Zomig as needed. She reports that Aimovigworked well for her migraines in the first 2 months. She states by the third month she began having a constant dull headache. She is currently taking the 70 mg injection. She returns today for evaluation.  HISTORY:Savannah S Swintonis a 63 y.o.femalehere as a referral from Dr. Bonnita Nasuti migraines.Past medical history of chronic migraines, depression, hypertension, high cholesterol,B12 deficiency, chronic insomnia, chronic kidney disease, multilevel degenerative disease.She has been seeing Dr. Domingo Cocking for nerve injections. Migraines since a teenager without any inciting event or head trauma.She has a family history of migraines,Her son and  paternal aunt have migraines. She has been [facility],she went every month and then every 2 months(likely nerve block)but she also says that she received Botox which helped but insurance would not pay anymore. Now she just goes as needed.For nerve blocks.She hasheadaches15 days a month. 8-10 are migrainous.This is been ongoing for the last 6  months.Her migraines feele like her head isswelling on one side, but they can move around, one eye droops, feels tight, she can hear her heart beating in her head especially when laying down, more pounding than pressure, she can't stand sound or noise. Dark rooms help. Being quiet helps. They can last for days straight. They can be severe with nausea and vomiting on average out of 10 can be 6-7/10. She has been to "so many doctors and so many places" and the doctors get frustrated because they can't help. She has been to headaches clinics.She was told she was faking in the past. She has tried everything per patient. No other associated symptoms or focal neurologic deficits or complaints. No aura. No medication overuse.  Tried: Topamax, verapamil, amitriptyline, depakote, inderal, she has had imitrex injections, maxalt, zomig. Neurontin, botox helped a lot but then her physician said insurance wouldn;t pay, effexor, cymbalta.   Reviewed notes, labs and imaging from outside physicians, which showed:  BUN 12 and creatinine 1.15 taken 04/05/2015. TSH 0.810 January 2017.  CT of the head 4/2014showed No acute intracranial abnormalities including mass lesion or mass effect, hydrocephalus, extra-axial fluid collection, midline shift, hemorrhage, or acute infarction, large ischemic events (personally reviewed images)   REVIEW OF SYSTEMS: Out of a complete 14 system review of symptoms, the patient complains only of the following symptoms, memory loss, headaches, and all other reviewed systems are negative.  ALLERGIES: Allergies  Allergen Reactions   Bystolic [Nebivolol Hcl] Palpitations and Other (See Comments)    Heart races   Morphine And Related Itching   Naproxen Hives and Itching    HOME MEDICATIONS: Outpatient Medications Prior to Visit  Medication Sig Dispense Refill   AIMOVIG 140 MG/ML SOAJ INJECT  140  MG SUBCUTANEOUSLY  EVERY 30 DAYS  3 mL 3   albuterol (PROAIR HFA) 108 (90  Base) MCG/ACT inhaler Inhale 2 puffs into the lungs every 4 (four) hours as needed for wheezing or shortness of breath. 1 Inhaler 1   ALPRAZolam (XANAX) 0.5 MG tablet Take 0.5 mg by mouth at bedtime as needed for anxiety.     amLODipine (NORVASC) 5 MG tablet Take 5 mg by mouth daily.     ezetimibe (ZETIA) 10 MG tablet Take 10 mg by mouth daily.     gabapentin (NEURONTIN) 300 MG capsule Take 300 mg by mouth daily.     mometasone-formoterol (DULERA) 100-5 MCG/ACT AERO Take 2 puffs first thing in am and then another 2 puffs about 12 hours later. 1 Inhaler 11   topiramate (TOPAMAX) 100 MG tablet Take 1 tablet (100 mg total) by mouth at bedtime. Take one tablet 50mg  at bedtime for 2 weeks then increase to 100mg  at bedtime, 30 tablet 11   Ubrogepant (UBRELVY) 50 MG TABS Take 50 mg by mouth daily as needed. Take 1 tablet at onset of headache, may repeat 1 tablet in 2 hours, no more than 2 tablets in 24 hours 10 tablet 11   zolmitriptan (ZOMIG) 5 MG nasal solution Use 1 spray in nostril at onset if headache. May repeat in 2 hours. Max twice daily. 6 Units 11  venlafaxine (EFFEXOR) 75 MG tablet Take 75 mg by mouth daily.     valsartan-hydrochlorothiazide (DIOVAN-HCT) 160-25 MG tablet Take 1 tablet by mouth daily.     No facility-administered medications prior to visit.     PAST MEDICAL HISTORY: Past Medical History:  Diagnosis Date   Abdominal cramps    Asthma    Back problem    BAD DISK IN BACK   Gastritis    H/O hiatal hernia    Hyperlipidemia    Hypertension    Knee problem    Leg cramps    Migraine    Shortness of breath    Stroke (HCC)    Ulcer    STOMACH & SORENESS & PAIN   UTI (urinary tract infection) 07/02/2012    PAST SURGICAL HISTORY: Past Surgical History:  Procedure Laterality Date   ABDOMINAL HYSTERECTOMY     ENDOSCOPY  2020   hiatal hernia, gastritis, and some bacteria in stomach   HEMORRHOID SURGERY      FAMILY HISTORY: Family History    Problem Relation Age of Onset   Hypertension Mother    Hyperlipidemia Mother    Asthma Father    Cancer Father    Hypertension Brother    Breast cancer Neg Hx     SOCIAL HISTORY: Social History   Socioeconomic History   Marital status: Married    Spouse name: Eddie   Number of children: 3   Years of education: Some college   Highest education level: Not on file  Occupational History   Occupation: works full time  Scientist, product/process development strain: Not on file   Food insecurity    Worry: Not on file    Inability: Not on Lexicographer needs    Medical: Not on file    Non-medical: Not on file  Tobacco Use   Smoking status: Never Smoker   Smokeless tobacco: Never Used  Substance and Sexual Activity   Alcohol use: No   Drug use: No   Sexual activity: Not on file  Lifestyle   Physical activity    Days per week: Not on file    Minutes per session: Not on file   Stress: Not on file  Relationships   Social connections    Talks on phone: Not on file    Gets together: Not on file    Attends religious service: Not on file    Active member of club or organization: Not on file    Attends meetings of clubs or organizations: Not on file    Relationship status: Not on file   Intimate partner violence    Fear of current or ex partner: Not on file    Emotionally abused: Not on file    Physically abused: Not on file    Forced sexual activity: Not on file  Other Topics Concern   Not on file  Social History Narrative   Lives with husband   Caffeine use: Coffee/tea/soda sometimes per patient      PHYSICAL EXAM  Vitals:   07/30/18 1507  BP: 122/78  Pulse: 86  Temp: 98.4 F (36.9 C)  TempSrc: Oral  Weight: 212 lb 12.8 oz (96.5 kg)  Height: 5\' 5"  (1.651 m)   Body mass index is 35.41 kg/m.  Generalized: Well developed, in no acute distress  Cardiology: normal rate and rhythm, no murmur noted Neurological examination   Mentation: Alert oriented to time, place, history taking. Follows all commands speech and language  fluent Cranial nerve II-XII: Pupils were equal round reactive to light. Extraocular movements were full, visual field were full on confrontational test. Facial sensation and strength were normal. Uvula tongue midline. Head turning and shoulder shrug  were normal and symmetric. Motor: The motor testing reveals 5 over 5 strength of all 4 extremities. Good symmetric motor tone is noted throughout.  Sensory: Sensory testing is intact to soft touch on all 4 extremities. No evidence of extinction is noted.  Coordination: Cerebellar testing reveals good finger-nose-finger and heel-to-shin bilaterally.  Gait and station: Gait is normal.   DIAGNOSTIC DATA (LABS, IMAGING, TESTING) - I reviewed patient records, labs, notes, testing and imaging myself where available.  No flowsheet data found.   Lab Results  Component Value Date   WBC 5.0 08/31/2015   HGB 12.9 08/31/2015   HCT 39.9 08/31/2015   MCV 91 08/31/2015   PLT 290 08/31/2015      Component Value Date/Time   NA 142 08/31/2015 1313   K 4.5 08/31/2015 1313   CL 101 08/31/2015 1313   CO2 26 08/31/2015 1313   GLUCOSE 82 08/31/2015 1313   GLUCOSE 98 06/18/2013 1844   BUN 12 08/31/2015 1313   CREATININE 1.02 (H) 08/31/2015 1313   CALCIUM 9.6 08/31/2015 1313   PROT 7.1 08/31/2015 1313   ALBUMIN 4.4 08/31/2015 1313   AST 14 08/31/2015 1313   ALT 9 08/31/2015 1313   ALKPHOS 74 08/31/2015 1313   BILITOT 0.3 08/31/2015 1313   GFRNONAA 60 08/31/2015 1313   GFRAA 70 08/31/2015 1313   No results found for: CHOL, HDL, LDLCALC, LDLDIRECT, TRIG, CHOLHDL No results found for: HGBA1C No results found for: VITAMINB12 No results found for: TSH     ASSESSMENT AND PLAN 63 y.o. year old female  has a past medical history of Abdominal cramps, Asthma, Back problem, Gastritis, H/O hiatal hernia, Hyperlipidemia, Hypertension, Knee problem, Leg cramps,  Migraine, Shortness of breath, Stroke ([place]), Ulcer, and UTI (urinary tract infection) (07/02/2012). here with     ICD-10-CM   1. Chronic migraine w/o aura w/o status migrainosus, not intractable  G43.709     We have discussed concerns of sleep apnea as identified in last note (snoring, excessive daytime sleepiness sleepiness, dry mouth, morning headaches, frequent waking). We have discussed relationship to migraines and risks of untreated sleep apnea. I feel strongly that she needs sleep study. She agrees and will schedule consultation. We will have her take venlafaxine 75mg  daily.  She will continue Amovig, topiramate and Ubrelvy. Follow up pending sleep evaluation (6 months if she decides not to do study). She verbalized understanding and agreement with plan.     No orders of the defined types were placed in this encounter.    Meds ordered this encounter  Medications   venlafaxine XR (EFFEXOR XR) 75 MG 24 hr capsule    Sig: Take 1 capsule (75 mg total) by mouth daily with breakfast.    Dispense:  90 capsule    Refill:  3    Order Specific Question:   Supervising Provider    Answer:   Melvenia Beam [[id number]]      I spent 15 minutes with the patient. 50% of this time was spent counseling and educating patient on plan of care and medications.    Debbora Presto, FNP-C 07/30/2018, 3:57 PM [facility] [address], [place], [place] [address] [phone]

## 2018-07-31 ENCOUNTER — Ambulatory Visit: Payer: Self-pay | Admitting: Family Medicine

## 2018-08-23 ENCOUNTER — Other Ambulatory Visit: Payer: Self-pay

## 2018-08-23 DIAGNOSIS — Z20822 Contact with and (suspected) exposure to covid-19: Secondary | ICD-10-CM

## 2018-08-25 LAB — NOVEL CORONAVIRUS, NAA: SARS-CoV-2, NAA: NOT DETECTED

## 2018-08-27 ENCOUNTER — Other Ambulatory Visit (HOSPITAL_COMMUNITY): Payer: Self-pay | Admitting: Gastroenterology

## 2018-08-27 ENCOUNTER — Other Ambulatory Visit: Payer: Self-pay | Admitting: Gastroenterology

## 2018-08-27 DIAGNOSIS — R1084 Generalized abdominal pain: Secondary | ICD-10-CM

## 2018-09-15 ENCOUNTER — Ambulatory Visit (HOSPITAL_COMMUNITY): Payer: Medicare PPO

## 2018-10-02 DIAGNOSIS — M545 Low back pain: Secondary | ICD-10-CM | POA: Diagnosis not present

## 2018-10-21 ENCOUNTER — Telehealth: Payer: Self-pay | Admitting: Family Medicine

## 2018-10-21 MED ORDER — AIMOVIG 140 MG/ML ~~LOC~~ SOAJ: 1.0000 mL | SUBCUTANEOUS | 0 refills | Status: DC

## 2018-10-21 NOTE — Telephone Encounter (Signed)
Sent to [place], after speaking to pt.  She is overdue on taking her aimovig.  Mail order will be 7-10 days out.  Wanted to get dose from local pharmacy.  I sent in prescription to [place].

## 2018-10-21 NOTE — Addendum Note (Signed)
Addended by: Brandon Melnick on: 10/21/2018 04:05 PM   Modules accepted: Orders

## 2018-10-21 NOTE — Telephone Encounter (Signed)
Phone rep checked office voicemail; there was a message from 2:27p.m. from [place] asking if 2 weeks worth of AIMOVIG 140 MG/ML SOAJ can be sent to  [facility]  [phone] while pt waits on her mail order to come in.

## 2018-10-29 DIAGNOSIS — M545 Low back pain: Secondary | ICD-10-CM | POA: Diagnosis not present

## 2018-11-21 ENCOUNTER — Encounter (HOSPITAL_COMMUNITY): Payer: Self-pay

## 2018-11-21 ENCOUNTER — Ambulatory Visit (HOSPITAL_COMMUNITY): Payer: Medicare Other | Attending: Gastroenterology

## 2018-11-25 ENCOUNTER — Other Ambulatory Visit: Payer: Self-pay

## 2018-11-25 DIAGNOSIS — Z20822 Contact with and (suspected) exposure to covid-19: Secondary | ICD-10-CM

## 2018-11-27 LAB — NOVEL CORONAVIRUS, NAA: SARS-CoV-2, NAA: NOT DETECTED

## 2018-12-08 ENCOUNTER — Other Ambulatory Visit: Payer: Self-pay | Admitting: Neurological Surgery

## 2019-01-21 NOTE — Progress Notes (Signed)
[place] ([place]), [place] - [address] [place] ([place]) [place] [address] Phone: [phone] Fax: [facility] - [place], [place] [address] Phone: [phone] Fax: [phone]      Your procedure is scheduled on Monday January 26, 2019.  Report to [facility] Main Entrance "A" at 05:30 A.M., and check in at the Admitting office.  Call this number if you have problems the morning of surgery:  [phone]  Call [phone] if you have any questions prior to your surgery date Monday-Friday 8am-4pm    Remember:  Do not eat or drink after midnight the night before your surgery    Take these medicines the morning of surgery with A SIP OF WATER: Albuterol (Proventil) nebulizer treatment - if needed Amlodipine (Norvasc) Bupropion (Wellbutrin XL) Ezetimibe (Zetia) Mometasone-formoterol (Dulera) inhaler Topiramate (Topamax)  7 days prior to surgery STOP taking any Aspirin (unless otherwise instructed by your surgeon), Aleve, Naproxen, Ibuprofen, Motrin, Advil, Goody's, BC's, all herbal medications, fish oil, and all vitamins.    The Morning of Surgery  Do not wear jewelry, make-up or nail polish.  Do not wear lotions, powders, perfumes, or deodorant  Do not shave 48 hours prior to surgery.    Do not bring valuables to the hospital.  [facility] is not responsible for any belongings or valuables.  If you are a smoker, DO NOT Smoke 24 hours prior to surgery  If you wear a CPAP at night please bring your mask the morning of surgery   Remember that you must have someone to transport you home after your surgery, and remain with you for 24 hours if you are discharged the same day.   Please bring cases for contacts, glasses, hearing aids, dentures or bridgework because it cannot be worn into surgery.    Leave your suitcase in the car.  After surgery  it may be brought to your room.  For patients admitted to the hospital, discharge time will be determined by your treatment team.  Patients discharged the day of surgery will not be allowed to drive home.    Special instructions:   [place]- Preparing For Surgery  Before surgery, you can play an important role. Because skin is not sterile, your skin needs to be as free of germs as possible. You can reduce the number of germs on your skin by washing with CHG (chlorahexidine gluconate) Soap before surgery.  CHG is an antiseptic cleaner which kills germs and bonds with the skin to continue killing germs even after washing.    Oral Hygiene is also important to reduce your risk of infection.  Remember - BRUSH YOUR TEETH THE MORNING OF SURGERY WITH YOUR REGULAR TOOTHPASTE  Please do not use if you have an allergy to CHG or antibacterial soaps. If your skin becomes reddened/irritated stop using the CHG.  Do not shave (including legs and underarms) for at least 48 hours prior to first CHG shower. It is OK to shave your face.  Please follow these instructions carefully.   1. Shower the NIGHT BEFORE SURGERY and the MORNING OF SURGERY with CHG Soap.   2. If you chose to wash your hair, wash your hair first as usual with your normal shampoo.  3. After you shampoo, rinse your hair and body thoroughly to remove the shampoo.  4. Use CHG as you would any other liquid soap. You can  apply CHG directly to the skin and wash gently with a scrungie or a clean washcloth.   5. Apply the CHG Soap to your body ONLY FROM THE NECK DOWN.  Do not use on open wounds or open sores. Avoid contact with your eyes, ears, mouth and genitals (private parts). Wash Face and genitals (private parts)  with your normal soap.   6. Wash thoroughly, paying special attention to the area where your surgery will be performed.  7. Thoroughly rinse your body with warm water from the neck down.  8. DO NOT shower/wash with your  normal soap after using and rinsing off the CHG Soap.  9. Pat yourself dry with a CLEAN TOWEL.  10. Wear CLEAN PAJAMAS to bed the night before surgery, wear comfortable clothes the morning of surgery  11. Place CLEAN SHEETS on your bed the night of your first shower and DO NOT SLEEP WITH PETS.    Day of Surgery:  Please shower the morning of surgery with the CHG soap Do not apply any deodorants/lotions. Please wear clean clothes to the hospital/surgery center.   Remember to brush your teeth WITH YOUR REGULAR TOOTHPASTE.   Please read over the following fact sheets that you were given.

## 2019-01-22 ENCOUNTER — Encounter (HOSPITAL_COMMUNITY): Payer: Self-pay

## 2019-01-22 ENCOUNTER — Other Ambulatory Visit: Payer: Self-pay

## 2019-01-22 ENCOUNTER — Encounter (HOSPITAL_COMMUNITY)
Admission: RE | Admit: 2019-01-22 | Discharge: 2019-01-22 | Disposition: A | Discharge status: Home / Self Care | Payer: Medicare Other | Source: Ambulatory Visit | Attending: Neurological Surgery | Admitting: Neurological Surgery

## 2019-01-22 ENCOUNTER — Other Ambulatory Visit (HOSPITAL_COMMUNITY)
Admission: RE | Admit: 2019-01-22 | Discharge: 2019-01-22 | Disposition: A | Discharge status: Home / Self Care | Payer: Medicare Other | Source: Ambulatory Visit | Attending: Neurological Surgery | Admitting: Neurological Surgery

## 2019-01-22 ENCOUNTER — Ambulatory Visit (HOSPITAL_COMMUNITY)
Admission: RE | Admit: 2019-01-22 | Discharge: 2019-01-22 | Disposition: A | Discharge status: Home / Self Care | Payer: Medicare Other | Source: Ambulatory Visit | Attending: Neurological Surgery | Admitting: Neurological Surgery

## 2019-01-22 DIAGNOSIS — M5136 Other intervertebral disc degeneration, lumbar region: Secondary | ICD-10-CM

## 2019-01-22 DIAGNOSIS — M545 Low back pain, unspecified: Secondary | ICD-10-CM

## 2019-01-22 DIAGNOSIS — Z20822 Contact with and (suspected) exposure to covid-19: Secondary | ICD-10-CM | POA: Insufficient documentation

## 2019-01-22 DIAGNOSIS — R9431 Abnormal electrocardiogram [ECG] [EKG]: Secondary | ICD-10-CM | POA: Diagnosis not present

## 2019-01-22 DIAGNOSIS — Z01818 Encounter for other preprocedural examination: Secondary | ICD-10-CM | POA: Diagnosis not present

## 2019-01-22 HISTORY — DX: Other intervertebral disc degeneration, lumbosacral region: M51.37

## 2019-01-22 HISTORY — DX: Gastro-esophageal reflux disease without esophagitis: K21.9

## 2019-01-22 HISTORY — DX: Cardiac murmur, unspecified: R01.1

## 2019-01-22 HISTORY — DX: Unspecified convulsions: R56.9

## 2019-01-22 HISTORY — DX: Other intervertebral disc degeneration, lumbosacral region without mention of lumbar back pain or lower extremity pain: M51.379

## 2019-01-22 LAB — SURGICAL PCR SCREEN
MRSA, PCR: NEGATIVE
Staphylococcus aureus: NEGATIVE

## 2019-01-22 LAB — CBC WITH DIFFERENTIAL/PLATELET
Abs Immature Granulocytes: 0.01 10*3/uL (ref 0.00–0.07)
Basophils Absolute: 0 10*3/uL (ref 0.0–0.1)
Basophils Relative: 0 %
Eosinophils Absolute: 0.1 10*3/uL (ref 0.0–0.5)
Eosinophils Relative: 3 %
HCT: 40.1 % (ref 36.0–46.0)
Hemoglobin: 13.1 g/dL (ref 12.0–15.0)
Immature Granulocytes: 0 %
Lymphocytes Relative: 39 %
Lymphs Abs: 1.8 10*3/uL (ref 0.7–4.0)
MCH: 30.3 pg (ref 26.0–34.0)
MCHC: 32.7 g/dL (ref 30.0–36.0)
MCV: 92.8 fL (ref 80.0–100.0)
Monocytes Absolute: 0.4 10*3/uL (ref 0.1–1.0)
Monocytes Relative: 9 %
Neutro Abs: 2.2 10*3/uL (ref 1.7–7.7)
Neutrophils Relative %: 49 %
Platelets: 217 10*3/uL (ref 150–400)
RBC: 4.32 MIL/uL (ref 3.87–5.11)
RDW: 14.4 % (ref 11.5–15.5)
WBC: 4.5 10*3/uL (ref 4.0–10.5)
nRBC: 0 % (ref 0.0–0.2)

## 2019-01-22 LAB — TYPE AND SCREEN
ABO/RH(D): O NEG
Antibody Screen: NEGATIVE

## 2019-01-22 LAB — BASIC METABOLIC PANEL
Anion gap: 8 (ref 5–15)
BUN: 6 mg/dL — ABNORMAL LOW (ref 8–23)
CO2: 23 mmol/L (ref 22–32)
Calcium: 9.1 mg/dL (ref 8.9–10.3)
Chloride: 110 mmol/L (ref 98–111)
Creatinine, Ser: 1.11 mg/dL — ABNORMAL HIGH (ref 0.44–1.00)
GFR calc Af Amer: >60 mL/min (ref >60)
GFR calc non Af Amer: 53 mL/min — ABNORMAL LOW (ref >60)
Glucose, Bld: 92 mg/dL (ref 70–99)
Potassium: 3.9 mmol/L (ref 3.5–5.1)
Sodium: 141 mmol/L (ref 135–145)

## 2019-01-22 LAB — PROTIME-INR
INR: 1 (ref 0.8–1.2)
Prothrombin Time: 12.8 seconds (ref 11.4–15.2)

## 2019-01-22 LAB — ABO/RH: ABO/RH(D): O NEG

## 2019-01-22 LAB — SARS CORONAVIRUS 2 (TAT 6-24 HRS): SARS Coronavirus 2: NEGATIVE

## 2019-01-22 NOTE — Progress Notes (Addendum)
Your procedure is scheduled on Monday January 26, 2019.  Report to [facility] Main Entrance "A" at 05:30 A.M., and check in at the Admitting office.             Your surgery or procedure is scheduled for 7:30 AM  Call this number if you have problems the morning of surgery:  [phone]- pre-surgery desk  Call [phone] if you have any questions prior to your surgery date Monday-Friday 8am-4pm    Remember:  Do not eat or drink after midnight the night before your surgery    Take these medicines the morning of surgery with A SIP OF WATER:  Amlodipine (Norvasc) Bupropion (Wellbutrin XL) Ezetimibe (Zetia) Mometasone-formoterol (Dulera) inhaler Topiramate (Topamax)  If Needed: Albuterol (Proventil) nebulizer treatment - Hydrocodone- Acetaminophen 7 days prior to surgery STOP taking any Aspirin (unless otherwise instructed by your surgeon), Aleve, Naproxen, Ibuprofen, Motrin, Advil, Goody's, BC's, all herbal medications, fish oil, and all vitamins.   Special instructions:   [place]- Preparing For Surgery  Before surgery, you can play an important role. Because skin is not sterile, your skin needs to be as free of germs as possible. You can reduce the number of germs on your skin by washing with CHG (chlorahexidine gluconate) Soap before surgery.  CHG is an antiseptic cleaner which kills germs and bonds with the skin to continue killing germs even after washing.    Oral Hygiene is also important to reduce your risk of infection.  Remember - BRUSH YOUR TEETH THE MORNING OF SURGERY WITH YOUR REGULAR TOOTHPASTE  Please do not use if you have an allergy to CHG or antibacterial soaps. If your skin becomes reddened/irritated stop using the CHG.  Do not shave (including legs and underarms) for at least 48 hours prior to first CHG shower. It is OK to shave your face.  Please follow these instructions carefully.   1. Shower the NIGHT BEFORE SURGERY and the MORNING OF SURGERY with  CHG Soap.   2. If you chose to wash your hair, wash your hair first as usual with your normal shampoo.  3. After you shampoo, wash your face and private area with the soap you use at home, then rinse your hair and body thoroughly to remove the shampoo and soap.rinse your hair and body thoroughly to remove the shampoo.  4. Use CHG as you would any other liquid soap. You can apply CHG directly to the skin and wash gently with a scrungie or a clean washcloth.   5. Apply the CHG Soap to your body ONLY FROM THE NECK DOWN.  Do not use on open wounds or open sores. Avoid contact with your eyes, ears, mouth and genitals (private parts).   6. Wash thoroughly, paying special attention to the area where your surgery will be performed.  7. Thoroughly rinse your body with warm water from the neck down.  8. DO NOT shower/wash with your normal soap after using and rinsing off the CHG Soap.  9. Pat yourself dry with a CLEAN TOWEL.  10. Wear CLEAN PAJAMAS to bed the night before surgery, wear comfortable clothes the morning of surgery  11. Place CLEAN SHEETS on your bed the night of your first shower and DO NOT SLEEP WITH PETS.  Day of Surgery: Shower as instructed above. Do not apply any deodorants/lotions/colognes or powders. Please wear clean clothes to the hospital/surgery center.   Remember to brush your teeth WITH YOUR REGULAR TOOTHPASTE.   Do not  wear jewelry, make-up or nail polish.  Do not wear lotions, powders, perfumes, or deodorant  Do not shave 48 hours prior to surgery.    Do not bring valuables to the hospital.  [facility] is not responsible for any belongings or valuables.   If you wear a CPAP at night please bring your mask the morning of surgery   Remember that you must have someone to transport you home after your surgery, and remain with you for 24 hours if you are discharged the same day.   Please bring cases for contacts, glasses, hearing aids, dentures or bridgework  because it cannot be worn into surgery.   For patients admitted to the hospital, discharge time will be determined by your treatment team.  Patients discharged the day of surgery will not be allowed to drive home.    Please read over the following fact sheets that you were given: Coughing and Deep Breathing, Pain Booklet,  and Surgical Site Infections

## 2019-01-22 NOTE — Progress Notes (Signed)
PCP -  Dr Arbie Cookey Whv [id number] ite with [facility]  Cardiologist - no  Chest x-ray - 01/22/2019  EKG - 01/21/2018  Stress Test - no  ECHO - 2016  Cardiac Cath - no   Sleep Study -  CPAP -   LABS-CBC with Diff BMP, PT, T/S   ASA-  ERAS-  HA1C- Fasting Blood Sugar -  Checks Blood Sugar _____ times a day  Anesthesia- Patient has Clip on Dentures. She reports that she never removes them.  Pt denies having chest pain, sob, or fever at this time. All instructions explained to the pt, with a verbal understanding of the material. Pt agrees to go over the instructions while at home for a better understanding. Pt also instructed to self quarantine after being tested for COVID-19. The opportunity to ask questions was provided.

## 2019-01-25 NOTE — Anesthesia Preprocedure Evaluation (Addendum)
Anesthesia Evaluation  Patient identified by MRN, date of birth, ID band Patient awake    Reviewed: Allergy & Precautions, NPO status , Patient's Chart, lab work & pertinent test results  History of Anesthesia Complications Negative for: history of anesthetic complications  Airway Mallampati: II  TM Distance: >3 FB Neck ROM: Full    Dental no notable dental hx. (+) Dental Advisory Given   Pulmonary asthma ,    Pulmonary exam normal        Cardiovascular hypertension, Pt. on medications Normal cardiovascular exam     Neuro/Psych  Headaches, CVA    GI/Hepatic Neg liver ROS, hiatal hernia, GERD  ,  Endo/Other  negative endocrine ROS  Renal/GU Renal disease     Musculoskeletal negative musculoskeletal ROS (+)   Abdominal   Peds  Hematology negative hematology ROS (+)   Anesthesia Other Findings Day of surgery medications reviewed with the patient.  Reproductive/Obstetrics                            Anesthesia Physical Anesthesia Plan  ASA: III  Anesthesia Plan: General   Post-op Pain Management:    Induction: Intravenous  PONV Risk Score and Plan: 4 or greater and Ondansetron, Dexamethasone, Scopolamine patch - Pre-op and Midazolam  Airway Management Planned: Oral ETT  Additional Equipment:   Intra-op Plan:   Post-operative Plan: Extubation in OR  Informed Consent: I have reviewed the patients History and Physical, chart, labs and discussed the procedure including the risks, benefits and alternatives for the proposed anesthesia with the patient or authorized representative who has indicated his/her understanding and acceptance.     Dental advisory given  Plan Discussed with: CRNA and Anesthesiologist  Anesthesia Plan Comments:         Anesthesia Quick Evaluation

## 2019-01-26 ENCOUNTER — Inpatient Hospital Stay (HOSPITAL_COMMUNITY): Payer: Medicare Other | Admitting: Anesthesiology

## 2019-01-26 ENCOUNTER — Inpatient Hospital Stay (HOSPITAL_COMMUNITY): Payer: Medicare Other

## 2019-01-26 ENCOUNTER — Inpatient Hospital Stay (HOSPITAL_COMMUNITY)
Admission: RE | Admit: 2019-01-26 | Discharge: 2019-01-28 | DRG: 455 | Disposition: A | Discharge status: Home / Self Care | Payer: Medicare Other | Attending: Neurological Surgery | Admitting: Neurological Surgery

## 2019-01-26 ENCOUNTER — Inpatient Hospital Stay (HOSPITAL_COMMUNITY)
Admission: RE | Disposition: A | Discharge status: Home / Self Care | Payer: Self-pay | Source: Home / Self Care | Attending: Neurological Surgery

## 2019-01-26 ENCOUNTER — Other Ambulatory Visit: Payer: Self-pay

## 2019-01-26 ENCOUNTER — Encounter (HOSPITAL_COMMUNITY): Payer: Self-pay | Admitting: Neurological Surgery

## 2019-01-26 DIAGNOSIS — Z79891 Long term (current) use of opiate analgesic: Secondary | ICD-10-CM | POA: Diagnosis not present

## 2019-01-26 DIAGNOSIS — G43909 Migraine, unspecified, not intractable, without status migrainosus: Secondary | ICD-10-CM | POA: Diagnosis present

## 2019-01-26 DIAGNOSIS — J45909 Unspecified asthma, uncomplicated: Secondary | ICD-10-CM | POA: Diagnosis not present

## 2019-01-26 DIAGNOSIS — Z8673 Personal history of transient ischemic attack (TIA), and cerebral infarction without residual deficits: Secondary | ICD-10-CM

## 2019-01-26 DIAGNOSIS — Z419 Encounter for procedure for purposes other than remedying health state, unspecified: Secondary | ICD-10-CM

## 2019-01-26 DIAGNOSIS — Z79899 Other long term (current) drug therapy: Secondary | ICD-10-CM

## 2019-01-26 DIAGNOSIS — E785 Hyperlipidemia, unspecified: Secondary | ICD-10-CM | POA: Diagnosis not present

## 2019-01-26 DIAGNOSIS — M5136 Other intervertebral disc degeneration, lumbar region: Secondary | ICD-10-CM | POA: Diagnosis present

## 2019-01-26 DIAGNOSIS — M4316 Spondylolisthesis, lumbar region: Secondary | ICD-10-CM | POA: Diagnosis not present

## 2019-01-26 DIAGNOSIS — Z90710 Acquired absence of both cervix and uterus: Secondary | ICD-10-CM | POA: Diagnosis not present

## 2019-01-26 DIAGNOSIS — M5126 Other intervertebral disc displacement, lumbar region: Secondary | ICD-10-CM | POA: Diagnosis present

## 2019-01-26 DIAGNOSIS — M4807 Spinal stenosis, lumbosacral region: Principal | ICD-10-CM | POA: Diagnosis present

## 2019-01-26 DIAGNOSIS — Z8349 Family history of other endocrine, nutritional and metabolic diseases: Secondary | ICD-10-CM | POA: Diagnosis not present

## 2019-01-26 DIAGNOSIS — M4317 Spondylolisthesis, lumbosacral region: Secondary | ICD-10-CM | POA: Diagnosis not present

## 2019-01-26 DIAGNOSIS — Z825 Family history of asthma and other chronic lower respiratory diseases: Secondary | ICD-10-CM

## 2019-01-26 DIAGNOSIS — I10 Essential (primary) hypertension: Secondary | ICD-10-CM | POA: Diagnosis present

## 2019-01-26 DIAGNOSIS — M5127 Other intervertebral disc displacement, lumbosacral region: Secondary | ICD-10-CM | POA: Diagnosis present

## 2019-01-26 DIAGNOSIS — M5137 Other intervertebral disc degeneration, lumbosacral region: Secondary | ICD-10-CM | POA: Diagnosis present

## 2019-01-26 DIAGNOSIS — Z888 Allergy status to other drugs, medicaments and biological substances status: Secondary | ICD-10-CM | POA: Diagnosis not present

## 2019-01-26 DIAGNOSIS — Z886 Allergy status to analgesic agent status: Secondary | ICD-10-CM | POA: Diagnosis not present

## 2019-01-26 DIAGNOSIS — Z8249 Family history of ischemic heart disease and other diseases of the circulatory system: Secondary | ICD-10-CM

## 2019-01-26 DIAGNOSIS — Z7951 Long term (current) use of inhaled steroids: Secondary | ICD-10-CM

## 2019-01-26 DIAGNOSIS — Z885 Allergy status to narcotic agent status: Secondary | ICD-10-CM | POA: Diagnosis not present

## 2019-01-26 DIAGNOSIS — Z981 Arthrodesis status: Secondary | ICD-10-CM

## 2019-01-26 SURGERY — POSTERIOR LUMBAR FUSION 2 LEVEL
Anesthesia: General | Site: Spine Lumbar

## 2019-01-26 MED ORDER — THROMBIN 20000 UNITS EX SOLR
CUTANEOUS | Status: DC | PRN
  Administered 2019-01-26: 20 mL via TOPICAL

## 2019-01-26 MED ORDER — SCOPOLAMINE 1 MG/3DAYS TD PT72
1.0000 | MEDICATED_PATCH | TRANSDERMAL | Status: DC
  Administered 2019-01-26: 1.5 mg via TRANSDERMAL
  Filled 2019-01-26: qty 1

## 2019-01-26 MED ORDER — THROMBIN 5000 UNITS EX SOLR
OROMUCOSAL | Status: DC | PRN
  Administered 2019-01-26 (×2): 5 mL via TOPICAL

## 2019-01-26 MED ORDER — ALBUTEROL SULFATE HFA 108 (90 BASE) MCG/ACT IN AERS
2.0000 | INHALATION_SPRAY | RESPIRATORY_TRACT | Status: AC
  Administered 2019-01-26 (×2): 2 via RESPIRATORY_TRACT
  Filled 2019-01-26: qty 6.7

## 2019-01-26 MED ORDER — ONDANSETRON HCL 4 MG/2ML IJ SOLN: 4.0000 mg | Freq: Four times a day (QID) | INTRAMUSCULAR | Status: DC | PRN

## 2019-01-26 MED ORDER — CEFAZOLIN SODIUM-DEXTROSE 2-4 GM/100ML-% IV SOLN
2.0000 g | Freq: Three times a day (TID) | INTRAVENOUS | Status: AC
  Administered 2019-01-26 (×2): 2 g via INTRAVENOUS
  Filled 2019-01-26 (×2): qty 100

## 2019-01-26 MED ORDER — SUCCINYLCHOLINE CHLORIDE 20 MG/ML IJ SOLN
INTRAMUSCULAR | Status: DC | PRN
  Administered 2019-01-26: 100 mg via INTRAVENOUS

## 2019-01-26 MED ORDER — SODIUM CHLORIDE 0.9% FLUSH
3.0000 mL | Freq: Two times a day (BID) | INTRAVENOUS | Status: DC
  Administered 2019-01-26 – 2019-01-27 (×2): 3 mL via INTRAVENOUS

## 2019-01-26 MED ORDER — LIDOCAINE 2% (20 MG/ML) 5 ML SYRINGE
INTRAMUSCULAR | Status: AC
  Filled 2019-01-26: qty 5

## 2019-01-26 MED ORDER — METHOCARBAMOL 1000 MG/10ML IJ SOLN
500.0000 mg | Freq: Four times a day (QID) | INTRAVENOUS | Status: DC | PRN
  Filled 2019-01-26: qty 5

## 2019-01-26 MED ORDER — LACTATED RINGERS IV SOLN: INTRAVENOUS | Status: DC | PRN

## 2019-01-26 MED ORDER — BUPIVACAINE HCL (PF) 0.25 % IJ SOLN
INTRAMUSCULAR | Status: AC
  Filled 2019-01-26: qty 30

## 2019-01-26 MED ORDER — HEPARIN SODIUM (PORCINE) 1000 UNIT/ML IJ SOLN
INTRAMUSCULAR | Status: DC | PRN
  Administered 2019-01-26: 5000 [IU] via INTRAVENOUS

## 2019-01-26 MED ORDER — CEFAZOLIN SODIUM-DEXTROSE 2-4 GM/100ML-% IV SOLN
2.0000 g | INTRAVENOUS | Status: AC
  Administered 2019-01-26: 2 g via INTRAVENOUS
  Filled 2019-01-26: qty 100

## 2019-01-26 MED ORDER — FENTANYL CITRATE (PF) 100 MCG/2ML IJ SOLN: 25.0000 ug | INTRAMUSCULAR | Status: DC | PRN

## 2019-01-26 MED ORDER — ACETAMINOPHEN 650 MG RE SUPP: 650.0000 mg | RECTAL | Status: DC | PRN

## 2019-01-26 MED ORDER — ONDANSETRON HCL 4 MG PO TABS: 4.0000 mg | ORAL_TABLET | Freq: Four times a day (QID) | ORAL | Status: DC | PRN

## 2019-01-26 MED ORDER — SODIUM CHLORIDE 0.9% FLUSH: 3.0000 mL | INTRAVENOUS | Status: DC | PRN

## 2019-01-26 MED ORDER — ARTHREX ANGEL - ACD-A SOLUTION (CHARTING ONLY) OPTIME
TOPICAL | Status: DC | PRN
  Administered 2019-01-26: 10 mL via TOPICAL

## 2019-01-26 MED ORDER — METHOCARBAMOL 500 MG PO TABS
500.0000 mg | ORAL_TABLET | Freq: Four times a day (QID) | ORAL | Status: DC | PRN
  Administered 2019-01-26 – 2019-01-28 (×5): 500 mg via ORAL
  Filled 2019-01-26 (×5): qty 1

## 2019-01-26 MED ORDER — SUCCINYLCHOLINE CHLORIDE 200 MG/10ML IV SOSY
PREFILLED_SYRINGE | INTRAVENOUS | Status: AC
  Filled 2019-01-26: qty 10

## 2019-01-26 MED ORDER — GLYCOPYRROLATE PF 0.2 MG/ML IJ SOSY
PREFILLED_SYRINGE | INTRAMUSCULAR | Status: AC
  Filled 2019-01-26: qty 1

## 2019-01-26 MED ORDER — ONDANSETRON HCL 4 MG/2ML IJ SOLN
INTRAMUSCULAR | Status: AC
  Filled 2019-01-26: qty 2

## 2019-01-26 MED ORDER — MIDAZOLAM HCL 5 MG/5ML IJ SOLN
INTRAMUSCULAR | Status: DC | PRN
  Administered 2019-01-26: 2 mg via INTRAVENOUS

## 2019-01-26 MED ORDER — FENTANYL CITRATE (PF) 100 MCG/2ML IJ SOLN
INTRAMUSCULAR | Status: DC | PRN
  Administered 2019-01-26: 100 ug via INTRAVENOUS
  Administered 2019-01-26 (×2): 50 ug via INTRAVENOUS

## 2019-01-26 MED ORDER — SODIUM CHLORIDE (PF) 0.9 % IJ SOLN
INTRAMUSCULAR | Status: DC | PRN
  Administered 2019-01-26: 5 mL via INTRAVENOUS

## 2019-01-26 MED ORDER — DEXAMETHASONE SODIUM PHOSPHATE 4 MG/ML IJ SOLN
4.0000 mg | Freq: Four times a day (QID) | INTRAMUSCULAR | Status: DC
  Administered 2019-01-26 – 2019-01-27 (×3): 4 mg via INTRAVENOUS
  Filled 2019-01-26 (×3): qty 1

## 2019-01-26 MED ORDER — ROCURONIUM BROMIDE 50 MG/5ML IV SOSY
PREFILLED_SYRINGE | INTRAVENOUS | Status: DC | PRN
  Administered 2019-01-26: 60 mg via INTRAVENOUS
  Administered 2019-01-26: 40 mg via INTRAVENOUS
  Administered 2019-01-26: 10 mg via INTRAVENOUS

## 2019-01-26 MED ORDER — SODIUM CHLORIDE 0.9 % IV SOLN
INTRAVENOUS | Status: DC | PRN
  Administered 2019-01-26: 500 mL

## 2019-01-26 MED ORDER — ROCURONIUM BROMIDE 10 MG/ML (PF) SYRINGE
PREFILLED_SYRINGE | INTRAVENOUS | Status: AC
  Filled 2019-01-26: qty 10

## 2019-01-26 MED ORDER — PHENYLEPHRINE HCL-NACL 10-0.9 MG/250ML-% IV SOLN
INTRAVENOUS | Status: DC | PRN
  Administered 2019-01-26: 20 ug/min via INTRAVENOUS

## 2019-01-26 MED ORDER — HYDROCODONE-ACETAMINOPHEN 10-325 MG PO TABS
1.0000 | ORAL_TABLET | ORAL | Status: DC | PRN
  Administered 2019-01-26: 1 via ORAL
  Filled 2019-01-26: qty 1

## 2019-01-26 MED ORDER — THROMBIN 5000 UNITS EX SOLR
CUTANEOUS | Status: AC
  Filled 2019-01-26: qty 5000

## 2019-01-26 MED ORDER — SUGAMMADEX SODIUM 500 MG/5ML IV SOLN
INTRAVENOUS | Status: AC
  Filled 2019-01-26: qty 5

## 2019-01-26 MED ORDER — AMLODIPINE BESYLATE 5 MG PO TABS
5.0000 mg | ORAL_TABLET | Freq: Every day | ORAL | Status: DC
  Administered 2019-01-27: 5 mg via ORAL
  Filled 2019-01-26: qty 1

## 2019-01-26 MED ORDER — OXYCODONE HCL 5 MG PO TABS
5.0000 mg | ORAL_TABLET | ORAL | Status: DC | PRN
  Administered 2019-01-26: 5 mg via ORAL
  Administered 2019-01-26: 10 mg via ORAL
  Administered 2019-01-27 (×5): 5 mg via ORAL
  Administered 2019-01-27: 10 mg via ORAL
  Administered 2019-01-28 (×2): 5 mg via ORAL
  Filled 2019-01-26 (×3): qty 2
  Filled 2019-01-26 (×7): qty 1

## 2019-01-26 MED ORDER — KETAMINE HCL 10 MG/ML IJ SOLN
INTRAMUSCULAR | Status: DC | PRN
  Administered 2019-01-26 (×2): 10 mg via INTRAVENOUS

## 2019-01-26 MED ORDER — ACETAMINOPHEN 500 MG PO TABS
1000.0000 mg | ORAL_TABLET | Freq: Once | ORAL | Status: AC
  Administered 2019-01-26: 1000 mg via ORAL
  Filled 2019-01-26: qty 2

## 2019-01-26 MED ORDER — ONDANSETRON HCL 4 MG/2ML IJ SOLN
INTRAMUSCULAR | Status: DC | PRN
  Administered 2019-01-26: 4 mg via INTRAVENOUS

## 2019-01-26 MED ORDER — SENNA 8.6 MG PO TABS
1.0000 | ORAL_TABLET | Freq: Two times a day (BID) | ORAL | Status: DC
  Administered 2019-01-26 – 2019-01-27 (×3): 8.6 mg via ORAL
  Filled 2019-01-26 (×3): qty 1

## 2019-01-26 MED ORDER — PROPOFOL 10 MG/ML IV BOLUS
INTRAVENOUS | Status: DC | PRN
  Administered 2019-01-26: 180 mg via INTRAVENOUS

## 2019-01-26 MED ORDER — ALBUMIN HUMAN 5 % IV SOLN: INTRAVENOUS | Status: DC | PRN

## 2019-01-26 MED ORDER — TOPIRAMATE 25 MG PO TABS
75.0000 mg | ORAL_TABLET | Freq: Every day | ORAL | Status: DC
  Administered 2019-01-27: 75 mg via ORAL
  Filled 2019-01-26: qty 3

## 2019-01-26 MED ORDER — SUGAMMADEX SODIUM 200 MG/2ML IV SOLN
INTRAVENOUS | Status: DC | PRN
  Administered 2019-01-26: 400 mg via INTRAVENOUS

## 2019-01-26 MED ORDER — BIOTIN 1 MG PO CAPS: 1.0000 mg | ORAL_CAPSULE | Freq: Every day | ORAL | Status: DC

## 2019-01-26 MED ORDER — FENTANYL CITRATE (PF) 250 MCG/5ML IJ SOLN
INTRAMUSCULAR | Status: AC
  Filled 2019-01-26: qty 5

## 2019-01-26 MED ORDER — THROMBIN 20000 UNITS EX SOLR
CUTANEOUS | Status: AC
  Filled 2019-01-26: qty 20000

## 2019-01-26 MED ORDER — DEXAMETHASONE 4 MG PO TABS
4.0000 mg | ORAL_TABLET | Freq: Four times a day (QID) | ORAL | Status: DC
  Administered 2019-01-26 – 2019-01-28 (×5): 4 mg via ORAL
  Filled 2019-01-26 (×4): qty 1

## 2019-01-26 MED ORDER — CHLORHEXIDINE GLUCONATE CLOTH 2 % EX PADS: 6.0000 | MEDICATED_PAD | Freq: Once | CUTANEOUS | Status: DC

## 2019-01-26 MED ORDER — KETAMINE HCL 50 MG/5ML IJ SOSY
PREFILLED_SYRINGE | INTRAMUSCULAR | Status: AC
  Filled 2019-01-26: qty 5

## 2019-01-26 MED ORDER — TOPIRAMATE 25 MG PO TABS: 50.0000 mg | ORAL_TABLET | ORAL | Status: DC

## 2019-01-26 MED ORDER — PROMETHAZINE HCL 25 MG/ML IJ SOLN: 6.2500 mg | INTRAMUSCULAR | Status: DC | PRN

## 2019-01-26 MED ORDER — MOMETASONE FURO-FORMOTEROL FUM 100-5 MCG/ACT IN AERO: 2.0000 | INHALATION_SPRAY | Freq: Two times a day (BID) | RESPIRATORY_TRACT | Status: DC | PRN

## 2019-01-26 MED ORDER — ACETAMINOPHEN 325 MG PO TABS
650.0000 mg | ORAL_TABLET | ORAL | Status: DC | PRN
  Administered 2019-01-26 – 2019-01-27 (×3): 650 mg via ORAL
  Filled 2019-01-26 (×3): qty 2

## 2019-01-26 MED ORDER — SODIUM CHLORIDE 0.9 % IV SOLN
250.0000 mL | INTRAVENOUS | Status: DC
  Administered 2019-01-26: 250 mL via INTRAVENOUS

## 2019-01-26 MED ORDER — 0.9 % SODIUM CHLORIDE (POUR BTL) OPTIME
TOPICAL | Status: DC | PRN
  Administered 2019-01-26: 1000 mL

## 2019-01-26 MED ORDER — EZETIMIBE 10 MG PO TABS
10.0000 mg | ORAL_TABLET | Freq: Every day | ORAL | Status: DC
  Administered 2019-01-27: 10 mg via ORAL
  Filled 2019-01-26 (×2): qty 1

## 2019-01-26 MED ORDER — CELECOXIB 200 MG PO CAPS
200.0000 mg | ORAL_CAPSULE | Freq: Two times a day (BID) | ORAL | Status: DC
  Administered 2019-01-26 – 2019-01-28 (×5): 200 mg via ORAL
  Filled 2019-01-26 (×5): qty 1

## 2019-01-26 MED ORDER — KETOROLAC TROMETHAMINE 15 MG/ML IJ SOLN
INTRAMUSCULAR | Status: DC | PRN
  Administered 2019-01-26: 15 mg via INTRAVENOUS

## 2019-01-26 MED ORDER — BUPROPION HCL ER (XL) 150 MG PO TB24
150.0000 mg | ORAL_TABLET | Freq: Every day | ORAL | Status: DC
  Administered 2019-01-27: 150 mg via ORAL
  Filled 2019-01-26 (×2): qty 1

## 2019-01-26 MED ORDER — CELECOXIB 200 MG PO CAPS
400.0000 mg | ORAL_CAPSULE | Freq: Once | ORAL | Status: AC
  Administered 2019-01-26: 400 mg via ORAL
  Filled 2019-01-26: qty 2

## 2019-01-26 MED ORDER — TOPIRAMATE 25 MG PO TABS
50.0000 mg | ORAL_TABLET | Freq: Every day | ORAL | Status: DC
  Administered 2019-01-26 – 2019-01-27 (×2): 50 mg via ORAL
  Filled 2019-01-26 (×2): qty 2

## 2019-01-26 MED ORDER — GABAPENTIN 300 MG PO CAPS: 600.0000 mg | ORAL_CAPSULE | Freq: Every evening | ORAL | Status: DC | PRN

## 2019-01-26 MED ORDER — ALBUTEROL SULFATE (2.5 MG/3ML) 0.083% IN NEBU: 2.5000 mg | INHALATION_SOLUTION | Freq: Four times a day (QID) | RESPIRATORY_TRACT | Status: DC | PRN

## 2019-01-26 MED ORDER — POTASSIUM CHLORIDE IN NACL 20-0.9 MEQ/L-% IV SOLN: INTRAVENOUS | Status: DC

## 2019-01-26 MED ORDER — HYDROMORPHONE HCL 1 MG/ML IJ SOLN
0.5000 mg | INTRAMUSCULAR | Status: DC | PRN
  Administered 2019-01-26: 0.5 mg via INTRAVENOUS
  Filled 2019-01-26: qty 0.5

## 2019-01-26 MED ORDER — MENTHOL 3 MG MT LOZG: 1.0000 | LOZENGE | OROMUCOSAL | Status: DC | PRN

## 2019-01-26 MED ORDER — CLONAZEPAM 0.5 MG PO TABS
0.5000 mg | ORAL_TABLET | Freq: Every day | ORAL | Status: DC
  Administered 2019-01-27: 0.5 mg via ORAL
  Filled 2019-01-26: qty 1

## 2019-01-26 MED ORDER — DEXAMETHASONE SODIUM PHOSPHATE 10 MG/ML IJ SOLN
10.0000 mg | Freq: Once | INTRAMUSCULAR | Status: AC
  Administered 2019-01-26: 08:00:00 10 mg via INTRAVENOUS
  Filled 2019-01-26: qty 1

## 2019-01-26 MED ORDER — BUPIVACAINE HCL (PF) 0.25 % IJ SOLN
INTRAMUSCULAR | Status: DC | PRN
  Administered 2019-01-26: 10 mL
  Administered 2019-01-26: 7 mL

## 2019-01-26 MED ORDER — LIDOCAINE 2% (20 MG/ML) 5 ML SYRINGE
INTRAMUSCULAR | Status: DC | PRN
  Administered 2019-01-26: 80 mg via INTRAVENOUS

## 2019-01-26 MED ORDER — PROPOFOL 10 MG/ML IV BOLUS
INTRAVENOUS | Status: AC
  Filled 2019-01-26: qty 20

## 2019-01-26 MED ORDER — PHENOL 1.4 % MT LIQD: 1.0000 | OROMUCOSAL | Status: DC | PRN

## 2019-01-26 MED ORDER — HEPARIN SODIUM (PORCINE) 1000 UNIT/ML IJ SOLN
INTRAMUSCULAR | Status: AC
  Filled 2019-01-26: qty 1

## 2019-01-26 MED ORDER — GLYCOPYRROLATE 0.2 MG/ML IJ SOLN
INTRAMUSCULAR | Status: DC | PRN
  Administered 2019-01-26: .2 mg via INTRAVENOUS

## 2019-01-26 MED ORDER — MIDAZOLAM HCL 2 MG/2ML IJ SOLN
INTRAMUSCULAR | Status: AC
  Filled 2019-01-26: qty 2

## 2019-01-26 SURGICAL SUPPLY — 73 items
ADH SKN CLS APL DERMABOND .7 (GAUZE/BANDAGES/DRESSINGS) ×1
APL SKNCLS STERI-STRIP NONHPOA (GAUZE/BANDAGES/DRESSINGS) ×1
BAG DECANTER FOR FLEXI CONT (MISCELLANEOUS) ×3 IMPLANT
BASKET BONE COLLECTION (BASKET) ×3 IMPLANT
BENZOIN TINCTURE PRP APPL 2/3 (GAUZE/BANDAGES/DRESSINGS) ×3 IMPLANT
BLADE CLIPPER SURG (BLADE) IMPLANT
BUR CARBIDE MATCH 3.0 (BURR) ×3 IMPLANT
CANISTER SUCT 3000ML PPV (MISCELLANEOUS) ×3 IMPLANT
CARTRIDGE OIL MAESTRO DRILL (MISCELLANEOUS) ×1 IMPLANT
CLOSURE WOUND 1/2 X4 (GAUZE/BANDAGES/DRESSINGS) ×2
CONT SPEC 4OZ CLIKSEAL STRL BL (MISCELLANEOUS) ×3 IMPLANT
COVER BACK TABLE 60X90IN (DRAPES) ×3 IMPLANT
COVER WAND RF STERILE (DRAPES) ×1 IMPLANT
DERMABOND ADVANCED (GAUZE/BANDAGES/DRESSINGS) ×2
DERMABOND ADVANCED .7 DNX12 (GAUZE/BANDAGES/DRESSINGS) ×1 IMPLANT
DIFFUSER DRILL AIR PNEUMATIC (MISCELLANEOUS) ×1 IMPLANT
DRAPE C-ARM 42X72 X-RAY (DRAPES) ×3 IMPLANT
DRAPE C-ARMOR (DRAPES) ×3 IMPLANT
DRAPE LAPAROTOMY 100X72X124 (DRAPES) ×3 IMPLANT
DRAPE SURG 17X23 STRL (DRAPES) ×3 IMPLANT
DRSG OPSITE POSTOP 4X6 (GAUZE/BANDAGES/DRESSINGS) ×2 IMPLANT
DURAPREP 26ML APPLICATOR (WOUND CARE) ×3 IMPLANT
ELECT REM PT RETURN 9FT ADLT (ELECTROSURGICAL) ×3
ELECTRODE REM PT RTRN 9FT ADLT (ELECTROSURGICAL) ×1 IMPLANT
EVACUATOR 1/8 PVC DRAIN (DRAIN) ×1 IMPLANT
GAUZE 4X4 16PLY RFD (DISPOSABLE) IMPLANT
GLOVE BIO SURGEON STRL SZ7 (GLOVE) ×2 IMPLANT
GLOVE BIO SURGEON STRL SZ8 (GLOVE) ×4 IMPLANT
GLOVE BIOGEL PI IND STRL 7.0 (GLOVE) IMPLANT
GLOVE BIOGEL PI IND STRL 7.5 (GLOVE) IMPLANT
GLOVE BIOGEL PI INDICATOR 7.0 (GLOVE) ×8
GLOVE BIOGEL PI INDICATOR 7.5 (GLOVE) ×4
GLOVE SURG SS PI 7.0 STRL IVOR (GLOVE) ×8 IMPLANT
GOWN STRL REUS W/ TWL LRG LVL3 (GOWN DISPOSABLE) IMPLANT
GOWN STRL REUS W/ TWL XL LVL3 (GOWN DISPOSABLE) ×2 IMPLANT
GOWN STRL REUS W/TWL 2XL LVL3 (GOWN DISPOSABLE) IMPLANT
GOWN STRL REUS W/TWL LRG LVL3 (GOWN DISPOSABLE) ×3
GOWN STRL REUS W/TWL XL LVL3 (GOWN DISPOSABLE) ×9
HEMOSTAT POWDER KIT SURGIFOAM (HEMOSTASIS) ×4 IMPLANT
IDENTI PS 11X9X25 15D (Spacer) ×6 IMPLANT
KIT BASIN OR (CUSTOM PROCEDURE TRAY) ×3 IMPLANT
KIT BONE MRW ASP ANGEL CPRP (KITS) ×2 IMPLANT
KIT TURNOVER KIT B (KITS) ×3 IMPLANT
MILL MEDIUM DISP (BLADE) ×2 IMPLANT
NDL BLUNT 18X1 FOR OR ONLY (NEEDLE) IMPLANT
NDL HYPO 25X1 1.5 SAFETY (NEEDLE) ×1 IMPLANT
NEEDLE BLUNT 18X1 FOR OR ONLY (NEEDLE) ×6 IMPLANT
NEEDLE HYPO 25X1 1.5 SAFETY (NEEDLE) ×3 IMPLANT
NS IRRIG 1000ML POUR BTL (IV SOLUTION) ×3 IMPLANT
OIL CARTRIDGE MAESTRO DRILL (MISCELLANEOUS)
PACK LAMINECTOMY NEURO (CUSTOM PROCEDURE TRAY) ×3 IMPLANT
PAD ARMBOARD 7.5X6 YLW CONV (MISCELLANEOUS) ×13 IMPLANT
PUTTY DBM ALLOSYNC PURE 10CC (Putty) ×2 IMPLANT
ROD LORD LIPPED TI 5.5X55 (Rod) ×4 IMPLANT
SCREW CANC SHANK MOD 6.5X35 (Screw) ×4 IMPLANT
SCREW CORT SHANK MOD 6.5X40 (Screw) ×8 IMPLANT
SCREW POLYAXIAL TULIP (Screw) ×12 IMPLANT
SET SCREW (Screw) ×18 IMPLANT
SET SCREW SPNE (Screw) IMPLANT
SPACER IDENTI PS 11X9X25 15D (Spacer) IMPLANT
SPACER IDENTITI PS 9X9X25 15D (Spacer) ×4 IMPLANT
SPONGE LAP 4X18 RFD (DISPOSABLE) IMPLANT
SPONGE SURGIFOAM ABS GEL 100 (HEMOSTASIS) ×3 IMPLANT
STRIP CLOSURE SKIN 1/2X4 (GAUZE/BANDAGES/DRESSINGS) ×4 IMPLANT
SUT VIC AB 0 CT1 18XCR BRD8 (SUTURE) ×1 IMPLANT
SUT VIC AB 0 CT1 8-18 (SUTURE) ×3
SUT VIC AB 2-0 CP2 18 (SUTURE) ×5 IMPLANT
SUT VIC AB 3-0 SH 8-18 (SUTURE) ×6 IMPLANT
SYR CONTROL 10ML LL (SYRINGE) ×5 IMPLANT
TOWEL GREEN STERILE (TOWEL DISPOSABLE) ×3 IMPLANT
TOWEL GREEN STERILE FF (TOWEL DISPOSABLE) ×3 IMPLANT
TRAY FOLEY MTR SLVR 16FR STAT (SET/KITS/TRAYS/PACK) ×3 IMPLANT
WATER STERILE IRR 1000ML POUR (IV SOLUTION) ×3 IMPLANT

## 2019-01-26 NOTE — Op Note (Signed)
01/26/2019  11:18 AM  PATIENT:  Savannah Taylor  64 y.o. female  PRE-OPERATIVE DIAGNOSIS: Severe degenerative disc disease L4-5 L5-S1, lumbar disc herniation L4-5 L5-S1, foraminal stenosis L5-S1, back and left leg pain  POST-OPERATIVE DIAGNOSIS:  same  PROCEDURE:   1. Decompressive lumbar laminectomy, medial facetectomy and foraminotomies L4-5 and L5-S1 requiring more work than would be required for a simple exposure of the disk for PLIF in order to adequately decompress the neural elements and address the spinal stenosis 2. Posterior lumbar interbody fusion L4-5 L5-S1 using porous titanium interbody cages packed with morcellized allograft and autograft soaked with bone marrow aspirate obtained through a separate fascial incision over the right iliac crest 3. Posterior fixation L4-S1 inclusive using Alphatec cortical pedicle screws.  4. Intertransverse arthrodesis L4-S1 using morcellized autograft and allograft.  SURGEON:  Sherley Bounds, MD  ASSISTANTS: Janice Norrie, FNP  ANESTHESIA:  General  EBL: 600 ml  Total I/O In: 2300 [I.V.:1700; IV Piggyback:600] Out: [id number] [Urine:235; Blood:600]  BLOOD ADMINISTERED:none  DRAINS: none   INDICATION FOR PROCEDURE: This patient presented with severe back and leg pain left greater than right. 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 The patient tried a reasonable attempt at conservative medical measures without relief. I recommended decompression and instrumented fusion to address the stenosis as well as the segmental  instability.  Patient understood the risks, benefits, and alternatives and potential outcomes and wished to proceed.  PROCEDURE DETAILS:  The patient was brought to the operating room. After induction of generalized endotracheal anesthesia the patient was rolled into the prone position on chest rolls and all pressure points were padded. The patient's  lumbar region was cleaned and then prepped with DuraPrep and draped in the usual sterile fashion. Anesthesia was injected and then a dorsal midline incision was made and carried down to the lumbosacral fascia. The fascia was opened and the paraspinous musculature was taken down in a subperiosteal fashion to expose L4-S1. A self-retaining retractor was placed. Intraoperative fluoroscopy confirmed my level, and I started with placement of the L4 cortical pedicle screws. The pedicle screw entry zones were identified utilizing surface landmarks and  AP and lateral fluoroscopy. I scored the cortex with the high-speed drill and then used the hand drill to drill an upward and outward direction into the pedicle. I then tapped line to line. I then placed a 6.5 x 40 mm cortical pedicle screw into the pedicles of L4 bilaterally.  I then dissected in a suprafascial plane to expose the iliac crest.  Opened the fascia and we used a Jamshidi needle to extract 60 cc of bone marrow aspirate from the iliac crest with the assistance of my nurse practitioner.  This was then spun down by [facility] device and 2 to 4 cc of  BMAC was soaked on morselized allograft for later arthrodesis.  I dried the hole with Surgifoam and closed the fascia.  I then turned my attention to the decompression and complete lumbar laminectomies, hemi- facetectomies, and foraminotomies were performed at L4-5 and L5-S1.  My nurse practitioner was directly involved in the decompression and exposure of the neural elements. the patient had significant spinal stenosis and this required more work than would be required for a simple exposure of the disc for posterior lumbar interbody fusion which would only require a limited laminotomy. Much more generous decompression and generous foraminotomy was undertaken in order to adequately decompress the neural elements and address the patient's  leg pain. The yellow ligament was removed to expose the underlying dura and nerve  roots, and generous foraminotomies were performed to adequately decompress the neural elements. Both the exiting and traversing nerve roots were decompressed on both sides until a coronary dilator passed easily along the nerve roots. Once the decompression was complete, I turned my attention to the posterior lower lumbar interbody fusion. The epidural venous vasculature was coagulated and cut sharply. Disc space was incised and the initial discectomy was performed with pituitary rongeurs. The disc space was distracted with sequential distractors to a height of 9 mm at L5-S1 and 13 mm at L4 5. We then used a series of scrapers and shavers to prepare the endplates for fusion. The midline was prepared with Epstein curettes. Once the complete discectomy was finished, we packed an appropriate sized interbody cage with local autograft and morcellized allograft, gently retracted the nerve root, and tapped the cage into position at L4-5 and L5-S1.  The midline between the cages was packed with morselized autograft and allograft. We then turned our attention to the placement of the lower pedicle screws. The pedicle screw entry zones were identified utilizing surface landmarks and fluoroscopy. I drilled into each pedicle utilizing the hand drill, and tapped each pedicle with the appropriate tap. We palpated with a ball probe to assure no break in the cortex. We then placed 6.5 x 40 mm pedicle screws into the pedicles bilaterally at L5 and S1.  My nurse practitioner assisted in placement of the pedicle screws.  We then decorticated the transverse processes and laid a mixture of morcellized autograft and allograft out over these to perform intertransverse arthrodesis at L4-S1. We then placed lordotic rods into the multiaxial screw heads of the pedicle screws and locked these in position with the locking caps and anti-torque device. We then checked our construct with AP and lateral fluoroscopy. Irrigated with copious amounts of  bacitracin-containing saline solution. Inspected the nerve roots once again to assure adequate decompression, lined to the dura with Gelfoam, placed powdered vancomycin into the wound, and then we closed the muscle and the fascia with 0 Vicryl. Closed the subcutaneous tissues with 2-0 Vicryl and subcuticular tissues with 3-0 Vicryl. The skin was closed with benzoin and Steri-Strips. Dressing was then applied, the patient was awakened from general anesthesia and transported to the recovery room in stable condition. At the end of the procedure all sponge, needle and instrument counts were correct.   PLAN OF CARE: admit to inpatient  PATIENT DISPOSITION:  PACU - hemodynamically stable.   Delay start of Pharmacological VTE agent (>24hrs) due to surgical blood loss or risk of bleeding:  yes

## 2019-01-26 NOTE — Anesthesia Procedure Notes (Signed)
Procedure Name: Intubation Date/Time: 01/26/2019 7:44 AM Performed by: Lavell Luster, CRNA Pre-anesthesia Checklist: Patient identified, Emergency Drugs available, Suction available, Patient being monitored and Timeout performed Patient Re-evaluated:Patient Re-evaluated prior to induction Oxygen Delivery Method: Circle system utilized Preoxygenation: Pre-oxygenation with 100% oxygen Induction Type: IV induction Ventilation: Mask ventilation without difficulty Laryngoscope Size: Mac and 3 Grade View: Grade II Tube type: Oral Tube size: 7.0 mm Number of attempts: 1 Airway Equipment and Method: Stylet Placement Confirmation: ETT inserted through vocal cords under direct vision,  positive ETCO2 and breath sounds checked- equal and bilateral Secured at: 21 cm Tube secured with: Tape Dental Injury: Teeth and Oropharynx as per pre-operative assessment

## 2019-01-26 NOTE — Anesthesia Postprocedure Evaluation (Signed)
Anesthesia Post Note  Patient: Savannah Taylor  Procedure(s) Performed: POSTERIOR LUMBAR INTERBODY FUSION LUMBAR FOUR-FIVE, LUMBAR FIVE-SACRAL ONE. (N/A Spine Lumbar)     Patient location during evaluation: PACU Anesthesia Type: General Level of consciousness: sedated Pain management: pain level controlled Vital Signs Assessment: post-procedure vital signs reviewed and stable Respiratory status: spontaneous breathing and respiratory function stable Cardiovascular status: stable Postop Assessment: no apparent nausea or vomiting Anesthetic complications: no    Last Vitals:  Vitals:   01/26/19 1200 01/26/19 1233  BP: 101/76 (!) 85/61  Pulse: 70 62  Resp: 17 18  Temp:  36.6 C  SpO2: 97% 94%    Last Pain:  Vitals:   01/26/19 1233  TempSrc: Oral  PainSc:                  Shenequa Howse DANIEL

## 2019-01-26 NOTE — Progress Notes (Signed)
Orthopedic Tech Progress Note Patient Details:  Savannah Taylor 08/10/1955 [id number] RN said patient has brace Patient ID: Savannah Taylor, female   DOB: 1955-07-30, 64 y.o.   MRN: [id number]   Savannah Taylor 01/26/2019, 6:57 PM

## 2019-01-26 NOTE — H&P (Signed)
Subjective: Patient is a 64 y.o. female admitted for back and leg pain. Onset of symptoms was several months ago, gradually worsening since that time.  The pain is rated severe, and is located at the across the lower back and radiates to legs L>R. The pain is described as aching and occurs all day. The symptoms have been progressive. Symptoms are exacerbated by exercise. 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 Abdominal cramps   . Asthma   . Back problem    BAD DISK IN BACK  . DDD (degenerative disc disease), lumbosacral   . Gastritis   . GERD (gastroesophageal reflux disease)   . H/O hiatal hernia   . Heart murmur    Years ago  . Hyperlipidemia   . Hypertension   . Knee problem   . Leg cramps   . Migraine    01/22/2019- last one > 1 month ago  . Seizure ([place])    1 many years ago with a migraine  . Shortness of breath   . Stroke [facility])    TIA  slight memory  issues TIA prior to 2010  . Ulcer    STOMACH & SORENESS & PAIN  . UTI (urinary tract infection) 07/02/2012    Past Surgical History:  Procedure Laterality Date  . ABDOMINAL HYSTERECTOMY    . ARTHROGRAM KNEE Right    MCL tear  . COLONOSCOPY    . ENDOSCOPY  2020   hiatal hernia, gastritis, and some bacteria in stomach  . HEMORRHOID SURGERY      Prior to Admission medications   Medication Sig Start Date End Date Taking? Authorizing Provider  AIMOVIG 140 MG/ML SOAJ INJECT  140  MG SUBCUTANEOUSLY  EVERY 30 DAYS  Patient taking differently: Inject 140 mg into the skin every 30 (thirty) days.  07/07/18  Yes Ward Givens, NP  albuterol (PROVENTIL) (2.5 MG/3ML) 0.083% nebulizer solution Take 2.5 mg by nebulization every 6 (six) hours as needed for wheezing or shortness of breath.   Yes [provider]  ALPRAZolam Duanne Moron) 1 MG tablet Take 0.5 mg by mouth at bedtime as needed for anxiety.    Yes [provider]  amLODipine (NORVASC) 5 MG tablet Take 5 mg by  mouth daily.   Yes [provider]  Biotin 1 MG CAPS Take 1 mg by mouth daily.   Yes [provider]  buPROPion (WELLBUTRIN XL) 150 MG 24 hr tablet Take 150 mg by mouth daily.   Yes [provider]  cholecalciferol (VITAMIN D3) 25 MCG (1000 UNIT) tablet Take 1,000 Units by mouth daily.   Yes [provider]  clonazePAM (KLONOPIN) 0.5 MG tablet Take 0.5-1 mg by mouth at bedtime.   Yes [provider]  ezetimibe (ZETIA) 10 MG tablet Take 10 mg by mouth daily.   Yes [provider]  gabapentin (NEURONTIN) 300 MG capsule Take 600 mg by mouth at bedtime as needed (pain).    Yes [provider]  HYDROcodone-acetaminophen (NORCO/VICODIN) 5-325 MG tablet Take 1 tablet by mouth every 6 (six) hours as needed for moderate pain.   Yes [provider]  mometasone-formoterol (DULERA) 100-5 MCG/ACT AERO Take 2 puffs first thing in am and then another 2 puffs about 12 hours later. Patient taking differently: Inhale 2 puffs into the lungs 2 (two) times daily as needed for wheezing or shortness of breath.  05/02/18  Yes Tanda Rockers, MD  topiramate (TOPAMAX) 100  MG tablet Take 100 mg by mouth 2 (two) times daily as needed.   Yes [provider]  topiramate (TOPAMAX) 50 MG tablet Take 50-75 mg by mouth See admin instructions. Take 75 mg by mouth in the morning and 50 mg at bedtime   Yes [provider]  vitamin B-12 (CYANOCOBALAMIN) 1000 MCG tablet Take 1,000 mcg by mouth daily.   Yes [provider]  albuterol (PROAIR HFA) 108 (90 Base) MCG/ACT inhaler Inhale 2 puffs into the lungs every 4 (four) hours as needed for wheezing or shortness of breath. Patient not taking: Reported on 01/16/2019 05/02/18   Tanda Rockers, MD  Erenumab-aooe (AIMOVIG) 140 MG/ML SOAJ Inject 140 mg into the skin every 30 (thirty) days. Patient not taking: Reported on 01/16/2019 10/21/18   Lomax, Amy, NP  topiramate (TOPAMAX) 100 MG tablet Take 1  tablet (100 mg total) by mouth at bedtime. Take one tablet 50mg  at bedtime for 2 weeks then increase to 100mg  at bedtime, Patient not taking: Reported on 01/16/2019 02/02/16   Melvenia Beam, MD  Ubrogepant (UBRELVY) 50 MG TABS Take 50 mg by mouth daily as needed. Take 1 tablet at onset of headache, may repeat 1 tablet in 2 hours, no more than 2 tablets in 24 hours Patient not taking: Reported on 01/16/2019 05/05/18   Debbora Presto, NP  venlafaxine XR (EFFEXOR XR) 75 MG 24 hr capsule Take 1 capsule (75 mg total) by mouth daily with breakfast. Patient not taking: Reported on 01/16/2019 07/30/18   Lomax, Amy, NP  zolmitriptan (ZOMIG) 5 MG nasal solution Use 1 spray in nostril at onset if headache. May repeat in 2 hours. Max twice daily. Patient not taking: Reported on 01/16/2019 06/11/17   Melvenia Beam, MD   Allergies  Allergen Reactions  . Bystolic [Nebivolol Hcl] Palpitations and Other (See Comments)  . Morphine And Related Itching  . Naproxen Hives and Itching    Social History   Tobacco Use  . Smoking status: Never Smoker  . Smokeless tobacco: Never Used  Substance Use Topics  . Alcohol use: No    Family History  Problem Relation Age of Onset  . Hypertension Mother   . Hyperlipidemia Mother   . Asthma Father   . Cancer Father   . Hypertension Brother   . Breast cancer Neg Hx      Review of Systems  Positive ROS: neg  All other systems have been reviewed and were otherwise negative with the exception of those mentioned in the HPI and as above.  Objective: Vital signs in last 24 hours: Temp:  [98.7 F (37.1 C)] 98.7 F (37.1 C) (01/25 0606) Pulse Rate:  [79] 79 (01/25 0606) Resp:  [20] 20 (01/25 0606) BP: (141)/(85) 141/85 (01/25 0606) SpO2:  [98 %] 98 % (01/25 0606) Weight:  [96.3 kg] 96.3 kg (01/25 0606)  General Appearance: Alert, cooperative, no distress, appears stated age Head: Normocephalic, without obvious abnormality, atraumatic Eyes: PERRL, conjunctiva/corneas  clear, EOM's intact    Neck: Supple, symmetrical, trachea midline Back: Symmetric, no curvature, ROM normal, no CVA tenderness Lungs:  respirations unlabored Heart: Regular rate and rhythm Abdomen: Soft, non-tender Extremities: Extremities normal, atraumatic, no cyanosis or edema Pulses: 2+ and symmetric all extremities Skin: Skin color, texture, turgor normal, no rashes or lesions  NEUROLOGIC:   Mental status: Alert and oriented x4,  no aphasia, good attention span, fund of knowledge, and memory Motor Exam - grossly normal Sensory Exam - grossly normal Reflexes: trace  Coordination - grossly normal Gait - grossly normal Balance - grossly normal Cranial Nerves: I: smell Not tested  II: visual acuity  OS: nl    OD: nl  II: visual fields Full to confrontation  II: pupils Equal, round, reactive to light  III,VII: ptosis None  III,IV,VI: extraocular muscles  Full ROM  V: mastication Normal  V: facial light touch sensation  Normal  V,VII: corneal reflex  Present  VII: facial muscle function - upper  Normal  VII: facial muscle function - lower Normal  VIII: hearing Not tested  IX: soft palate elevation  Normal  IX,X: gag reflex Present  XI: trapezius strength  5/5  XI: sternocleidomastoid strength 5/5  XI: neck flexion strength  5/5  XII: tongue strength  Normal    Data Review Lab Results  Component Value Date   WBC 4.5 01/22/2019   HGB 13.1 01/22/2019   HCT 40.1 01/22/2019   MCV 92.8 01/22/2019   PLT 217 01/22/2019   Lab Results  Component Value Date   NA 141 01/22/2019   K 3.9 01/22/2019   CL 110 01/22/2019   CO2 23 01/22/2019   BUN 6 (L) 01/22/2019   CREATININE 1.11 (H) 01/22/2019   GLUCOSE 92 01/22/2019   Lab Results  Component Value Date   INR 1.0 01/22/2019    Assessment/Plan:  Estimated body mass index is 35.35 kg/m as calculated from the following:   Height as of this encounter: 5\' 5"  (1.651 m).   Weight as of this encounter: 96.3 kg. Patient  admitted for PLIF L4-5 L5-S1. Patient has failed a reasonable attempt at conservative therapy.  I explained the condition and procedure to the patient and answered any questions.  Patient wishes to proceed with procedure as planned. Understands risks/ benefits and typical outcomes of procedure.   Eustace Moore 01/26/2019 7:31 AM

## 2019-01-26 NOTE — Evaluation (Signed)
Physical Therapy Evaluation Patient Details Name: Savannah Taylor MRN: [id number] DOB: 1955/06/15 Today's Date: 01/26/2019   History of Present Illness  Pt is a 64 y/o female s/p L4-S1 PLIF. PMH includes asthma, HTN, seizure, and TIA.   Clinical Impression  Patient is s/p above surgery resulting in the deficits listed below (see PT Problem List). Pt limited secondary to pain this session. Very guarded gait using RW. Required min to min guard for mobility tasks. Educated about back precautions and using RW at home to increase safety. Patient will benefit from skilled PT to increase their independence and safety with mobility (while adhering to their precautions) to allow discharge to the venue listed below.     Follow Up Recommendations No PT follow up;Supervision for mobility/OOB    Equipment Recommendations  Rolling walker with 5" wheels;3in1 (PT)    Recommendations for Other Services       Precautions / Restrictions Precautions Precautions: Back Precaution Booklet Issued: Yes (comment) Precaution Comments: Reviewed back precautions with pt.  Required Braces or Orthoses: Spinal Brace Spinal Brace: Lumbar corset;Applied in sitting position Restrictions Weight Bearing Restrictions: No      Mobility  Bed Mobility Overal bed mobility: Needs Assistance Bed Mobility: Sidelying to Sit;Sit to Sidelying   Sidelying to sit: Min guard     Sit to sidelying: Min guard General bed mobility comments: Min guard for safety. Increased time to perform secondary to pain.   Transfers Overall transfer level: Needs assistance Equipment used: Rolling walker (2 wheeled) Transfers: Sit to/from Stand Sit to Stand: Min assist         General transfer comment: Min A for lift assist to stand. Cues to power through LEs.   Ambulation/Gait Ambulation/Gait assistance: Min guard Gait Distance (Feet): 50 Feet Assistive device: Rolling walker (2 wheeled) Gait Pattern/deviations: Step-through  pattern;Decreased stride length Gait velocity: Decreased    General Gait Details: Very slow, guarded gait. Pt reporting increased pain which limited distance. Educated about using RW at home to increase safety with mobility.   Stairs            Wheelchair Mobility    Modified Rankin (Stroke Patients Only)       Balance Overall balance assessment: Needs assistance Sitting-balance support: No upper extremity supported;Feet supported Sitting balance-Leahy Scale: Fair     Standing balance support: Bilateral upper extremity supported;During functional activity Standing balance-Leahy Scale: Poor Standing balance comment: Reliant on BUE support                              Pertinent Vitals/Pain Pain Assessment: 0-10 Pain Score: 10-Worst pain ever Pain Location: back Pain Descriptors / Indicators: Aching;Operative site guarding Pain Intervention(s): Monitored during session;Limited activity within patient's tolerance;Repositioned    Home Living Family/patient expects to be discharged to:: Private residence Living Arrangements: Spouse/significant other Available Help at Discharge: Family Type of Home: House Home Access: Stairs to enter   Technical brewer of Steps: 3 Home Layout: Two level Home Equipment: None Additional Comments: Reports she plans to stay on 1st floor; only half bath on 1st floor.     Prior Function Level of Independence: Independent               Hand Dominance        Extremity/Trunk Assessment   Upper Extremity Assessment Upper Extremity Assessment: Defer to OT evaluation    Lower Extremity Assessment Lower Extremity Assessment: Generalized weakness    Cervical /  Trunk Assessment Cervical / Trunk Assessment: Other exceptions Cervical / Trunk Exceptions: s/p PLIF   Communication   Communication: No difficulties  Cognition Arousal/Alertness: Awake/alert Behavior During Therapy: WFL for tasks  assessed/performed Overall Cognitive Status: Within Functional Limits for tasks assessed                                        General Comments General comments (skin integrity, edema, etc.): Educated about how to maintain precautions during toileting tasks.     Exercises     Assessment/Plan    PT Assessment Patient needs continued PT services  PT Problem List Decreased strength;Decreased balance;Decreased activity tolerance;Decreased mobility;Decreased knowledge of use of DME;Pain       PT Treatment Interventions Gait training;DME instruction;Stair training;Functional mobility training;Therapeutic activities;Therapeutic exercise;Balance training;Patient/family education    PT Goals (Current goals can be found in the Care Plan section)  Acute Rehab PT Goals Patient Stated Goal: to decrease pain  PT Goal Formulation: With patient Time For Goal Achievement: 02/09/19 Potential to Achieve Goals: Good    Frequency Min 5X/week   Barriers to discharge        Co-evaluation               AM-PAC PT "6 Clicks" Mobility  Outcome Measure Help needed turning from your back to your side while in a flat bed without using bedrails?: A Little Help needed moving from lying on your back to sitting on the side of a flat bed without using bedrails?: A Little Help needed moving to and from a bed to a chair (including a wheelchair)?: A Little Help needed standing up from a chair using your arms (e.g., wheelchair or bedside chair)?: A Little Help needed to walk in hospital room?: A Little Help needed climbing 3-5 steps with a railing? : A Lot 6 Click Score: 17    End of Session Equipment Utilized During Treatment: Gait belt;Back brace Activity Tolerance: Patient limited by pain Patient left: in bed;with call bell/phone within reach;with family/visitor present Nurse Communication: Mobility status PT Visit Diagnosis: Other abnormalities of gait and mobility  (R26.89);Muscle weakness (generalized) (M62.81);Pain Pain - part of body: (back)    Time: 1520-1600 PT Time Calculation (min) (ACUTE ONLY): 40 min   Charges:   PT Evaluation $PT Eval Low Complexity: 1 Low PT Treatments $Gait Training: 8-22 mins $Therapeutic Activity: 8-22 mins        Lou Miner, DPT  Acute Rehabilitation Services  Pager: [phone] Office: [phone]   Rudean Hitt 01/26/2019, 5:29 PM

## 2019-01-26 NOTE — Transfer of Care (Signed)
Immediate Anesthesia Transfer of Care Note  Patient: Savannah Taylor  Procedure(s) Performed: POSTERIOR LUMBAR INTERBODY FUSION LUMBAR FOUR-FIVE, LUMBAR FIVE-SACRAL ONE. (N/A Spine Lumbar)  Patient Location: PACU  Anesthesia Type:General  Level of Consciousness: awake, alert  and sedated  Airway & Oxygen Therapy: Patient connected to face mask oxygen  Post-op Assessment: Post -op Vital signs reviewed and stable  Post vital signs: stable  Last Vitals:  Vitals Value Taken Time  BP 108/79 01/26/19 1130  Temp    Pulse 77 01/26/19 1131  Resp 17 01/26/19 1131  SpO2 96 % 01/26/19 1131  Vitals shown include unvalidated device data.  Last Pain:  Vitals:   01/26/19 0643  TempSrc:   PainSc: 5          Complications: No apparent anesthesia complications

## 2019-01-27 MED ORDER — ALUM & MAG HYDROXIDE-SIMETH 200-200-20 MG/5ML PO SUSP
30.0000 mL | Freq: Four times a day (QID) | ORAL | Status: DC | PRN
  Administered 2019-01-27: 30 mL via ORAL

## 2019-01-27 MED FILL — Thrombin For Soln 5000 Unit: CUTANEOUS | Qty: 5000 | Status: AC

## 2019-01-27 NOTE — Evaluation (Signed)
Occupational Therapy Evaluation Patient Details Name: Savannah Taylor MRN: [id number] DOB: 1955/04/28 Today's Date: 01/27/2019    History of Present Illness Pt is a 64 y/o female s/p L4-S1 PLIF. PMH includes asthma, HTN, seizure, and TIA.    Clinical Impression   PTA, pt was living with her husband and was independent. Currently, pt requires Supervision-Min Guard A for ADLs and functional mobility. Provided education and handout on brace management, LB ADLs, toileting, and tub transfer with 3N1; pt demonstrated understanding. Answered all pt questions. Recommend dc home once medically stable per physician. All acute OT needs met and will sign off. Thank you.     Follow Up Recommendations  No OT follow up;Supervision/Assistance - 24 hour    Equipment Recommendations  3 in 1 bedside commode    Recommendations for Other Services PT consult     Precautions / Restrictions Precautions Precautions: Back Precaution Booklet Issued: Yes (comment) Precaution Comments: Pt recalling 2/3 back precautions requiring cues for "no twisting". Educated on compensatory techniques for ADLs with back precautions Required Braces or Orthoses: Spinal Brace Spinal Brace: Lumbar corset;Applied in sitting position Restrictions Weight Bearing Restrictions: No      Mobility Bed Mobility               General bed mobility comments: Sitting at EOB upon arrival  Transfers Overall transfer level: Needs assistance Equipment used: None Transfers: Sit to/from Stand Sit to Stand: Min guard         General transfer comment: Min Guard A for safety.    Balance Overall balance assessment: Needs assistance Sitting-balance support: No upper extremity supported;Feet supported Sitting balance-Leahy Scale: Good     Standing balance support: Bilateral upper extremity supported;During functional activity Standing balance-Leahy Scale: Fair Standing balance comment: Able to maintain statding balance                            ADL either performed or assessed with clinical judgement   ADL Overall ADL's : Needs assistance/impaired Eating/Feeding: Independent   Grooming: Supervision/safety;Standing   Upper Body Bathing: Set up;Supervision/ safety;Sitting   Lower Body Bathing: Min guard;Sit to/from stand   Upper Body Dressing : Supervision/safety;Sitting Upper Body Dressing Details (indicate cue type and reason): Reveiwing brace management and pt donning brace with Min cues Lower Body Dressing: Min guard;Sit to/from stand Lower Body Dressing Details (indicate cue type and reason): Pt bringing ankles to knees for donning underwear. Min guard A for safety Toilet Transfer: Min guard;Ambulation(simulated to recliner) Armed forces technical officer Details (indicate cue type and reason): Min Guard A for safety   Toileting - Clothing Manipulation Details (indicate cue type and reason): Pt demosntrating techniques for peri care Tub/ Shower Transfer: Min guard;Ambulation;3 in 1(simulated) Tub/Shower Transfer Details (indicate cue type and reason):  Educating pt on tub transfer techniques. Pt performing with MIn guard A for safety Functional mobility during ADLs: Min guard General ADL Comments: Pt demonstrating good adherance to back precautions. Providing education on compenatory techniques for LB ADLs, brace management, toileting, and tub transfer.     Vision Baseline Vision/History: Wears glasses Wears Glasses: At all times Patient Visual Report: No change from baseline       Perception     Praxis      Pertinent Vitals/Pain Pain Assessment: 0-10 Pain Score: 5  Pain Location: back Pain Descriptors / Indicators: Aching;Operative site guarding Pain Intervention(s): Monitored during session;Limited activity within patient's tolerance;Repositioned     Hand Dominance  Extremity/Trunk Assessment Upper Extremity Assessment Upper Extremity Assessment: Overall WFL for tasks assessed    Lower Extremity Assessment Lower Extremity Assessment: Defer to PT evaluation   Cervical / Trunk Assessment Cervical / Trunk Assessment: Other exceptions Cervical / Trunk Exceptions: s/p PLIF    Communication Communication Communication: No difficulties   Cognition Arousal/Alertness: Awake/alert Behavior During Therapy: WFL for tasks assessed/performed Overall Cognitive Status: Within Functional Limits for tasks assessed                                     General Comments       Exercises     Shoulder Instructions      Home Living Family/patient expects to be discharged to:: Private residence Living Arrangements: Spouse/significant other Available Help at Discharge: Family Type of Home: House Home Access: Stairs to enter Technical brewer of Steps: 3   Home Layout: Two level Alternate Level Stairs-Number of Steps: Flight   Bathroom Shower/Tub: Teacher, early years/pre: Standard     Home Equipment: None   Additional Comments: Reports she plans to stay on 1st floor; only half bath on 1st floor.       Prior Functioning/Environment Level of Independence: Independent                 OT Problem List:        OT Treatment/Interventions:      OT Goals(Current goals can be found in the care plan section) Acute Rehab OT Goals Patient Stated Goal: to decrease pain  OT Goal Formulation: All assessment and education complete, DC therapy  OT Frequency:     Barriers to D/C:            Co-evaluation              AM-PAC OT "6 Clicks" Daily Activity     Outcome Measure Help from another person eating meals?: None Help from another person taking care of personal grooming?: None Help from another person toileting, which includes using toliet, bedpan, or urinal?: A Little Help from another person bathing (including washing, rinsing, drying)?: A Little Help from another person to put on and taking off regular upper body  clothing?: None Help from another person to put on and taking off regular lower body clothing?: A Little 6 Click Score: 21   End of Session Equipment Utilized During Treatment: Back brace Nurse Communication: Mobility status  Activity Tolerance: Patient tolerated treatment well Patient left: with call bell/phone within reach;in bed(EOB with PT)  OT Visit Diagnosis: Unsteadiness on feet (R26.81);Muscle weakness (generalized) (M62.81);Other abnormalities of gait and mobility (R26.89)                Time: 5885-0277 OT Time Calculation (min): 22 min Charges:  OT General Charges $OT Visit: 1 Visit OT Evaluation $OT Eval Low Complexity: 1 Low  Aydyn Testerman MSOT, OTR/L Acute Rehab Pager: [phone] Office: [place] 01/27/2019, 8:20 AM

## 2019-01-27 NOTE — Progress Notes (Signed)
Subjective: Patient reports moderate back pain 5/10, some left anterior leg pain which is better than preop.    Objective: Vital signs in last 24 hours: Temp:  [97.7 F (36.5 C)-98.7 F (37.1 C)] 98.7 F (37.1 C) (01/26 0721) Pulse Rate:  [58-87] 72 (01/26 0721) Resp:  [14-23] 18 (01/26 0721) BP: (85-122)/(53-79) 103/62 (01/26 0721) SpO2:  [92 %-100 %] 92 % (01/26 0721)  Intake/Output from previous day: 01/25 0701 - 01/26 0700 In: 2900 [I.V.:2300; IV Piggyback:600] Out: 835 [Urine:235; Blood:600] Intake/Output this shift: No intake/output data recorded.  Neurologic: Grossly normal  Lab Results: Lab Results  Component Value Date   WBC 4.5 01/22/2019   HGB 13.1 01/22/2019   HCT 40.1 01/22/2019   MCV 92.8 01/22/2019   PLT 217 01/22/2019   Lab Results  Component Value Date   INR 1.0 01/22/2019   BMET Lab Results  Component Value Date   NA 141 01/22/2019   K 3.9 01/22/2019   CL 110 01/22/2019   CO2 23 01/22/2019   GLUCOSE 92 01/22/2019   BUN 6 (L) 01/22/2019   CREATININE 1.11 (H) 01/22/2019   CALCIUM 9.1 01/22/2019    Studies/Results: DG Lumbar Spine 2-3 Views  Result Date: 01/26/2019 CLINICAL DATA:  Lumbar fusion EXAM: LUMBAR SPINE - 2-3 VIEW; DG C-ARM 1-60 MIN FLUOROSCOPY TIME:  1 minutes 4 seconds COMPARISON:  None. FINDINGS: Frontal and lateral intraoperative radiographs demonstrate fusion at L4-S1. Rods and pedicle screws are present at these levels. Interbody fusion devices are present at L4-L5 and L5-S1 disc levels. IMPRESSION: Fluoroscopic guidance for L4-S1 fusion. Electronically Signed   By: Macy Mis M.D.   On: 01/26/2019 11:44   DG C-Arm 1-60 Min  Result Date: 01/26/2019 CLINICAL DATA:  Lumbar fusion EXAM: LUMBAR SPINE - 2-3 VIEW; DG C-ARM 1-60 MIN FLUOROSCOPY TIME:  1 minutes 4 seconds COMPARISON:  None. FINDINGS: Frontal and lateral intraoperative radiographs demonstrate fusion at L4-S1. Rods and pedicle screws are present at these levels.  Interbody fusion devices are present at L4-L5 and L5-S1 disc levels. IMPRESSION: Fluoroscopic guidance for L4-S1 fusion. Electronically Signed   By: Macy Mis M.D.   On: 01/26/2019 11:44    Assessment/Plan: Postop day 1 two level lumbar fusion. Continue therapies today, will plan on dc home tomorrow.    LOS: 1 day    Savannah Taylor Savannah Taylor 01/27/2019, 7:54 AM

## 2019-01-27 NOTE — Progress Notes (Signed)
Physical Therapy Treatment Patient Details Name: Savannah Taylor MRN: [id number] DOB: 12-26-1955 Today's Date: 01/27/2019    History of Present Illness Pt is a 64 y/o female s/p L4-S1 PLIF. PMH includes asthma, HTN, seizure, and TIA.     PT Comments    Pt progressing towards physical therapy goals. Was able to perform transfers and ambulation with gross min guard assist to supervision for safety with RW for support. Pt was able to navigate around room without RW, however for longer distances would recommend pt continue to use RW for energy conservation, and to take some pressure off back and LE's. Pt was able to negotiate a flight of stairs today with good technique and no unsteadiness or evidence of knee buckle bilaterally. Will continue to follow and progress as able per POC.     Follow Up Recommendations  No PT follow up;Supervision for mobility/OOB     Equipment Recommendations  Rolling walker with 5" wheels;3in1 (PT)    Recommendations for Other Services       Precautions / Restrictions Precautions Precautions: Back Precaution Booklet Issued: Yes (comment) Precaution Comments: Reviewed precautions verbally and pt was cued for maintenance of precautions during functional mobility.  Required Braces or Orthoses: Spinal Brace Spinal Brace: Lumbar corset;Applied in sitting position Restrictions Weight Bearing Restrictions: No    Mobility  Bed Mobility               General bed mobility comments: Pt was sitting up EOB when PT arrived.   Transfers Overall transfer level: Needs assistance Equipment used: None;Rolling walker (2 wheeled) Transfers: Sit to/from Stand Sit to Stand: Supervision         General transfer comment: Close supervision for safety initially. Light supervision by end of session. Pt stood initially with RW and then ended in bathroom to sit on BSC to wash up. Walker would not fit in bathroom but pt was able to manage well without it to step in  front, reach back, and control descent to [facility]   Ambulation/Gait Ambulation/Gait assistance: Min guard;Supervision Gait Distance (Feet): 150 Feet Assistive device: Rolling walker (2 wheeled) Gait Pattern/deviations: Step-through pattern;Decreased stride length Gait velocity: Decreased  Gait velocity interpretation: <1.31 ft/sec, indicative of household ambulator General Gait Details: Very slow and guarded. Recommend continued walker use for longer distance even though she can ambulate around the room without it. Pt reports increased LE weakness towards end of gait training.    Stairs  Pt was able to navigate 10 stairs with min guard assist progressing to very light min guard/close supervision. VC's for sequencing and general safety.            Wheelchair Mobility    Modified Rankin (Stroke Patients Only)       Balance Overall balance assessment: Needs assistance Sitting-balance support: No upper extremity supported;Feet supported Sitting balance-Leahy Scale: Good     Standing balance support: Bilateral upper extremity supported;During functional activity Standing balance-Leahy Scale: Fair Standing balance comment: Able to maintain standing balance without UE support.                             Cognition Arousal/Alertness: Awake/alert Behavior During Therapy: WFL for tasks assessed/performed Overall Cognitive Status: Within Functional Limits for tasks assessed  Exercises      General Comments        Pertinent Vitals/Pain Pain Assessment: Faces Pain Score: 5  Faces Pain Scale: Hurts little more Pain Location: back Pain Descriptors / Indicators: Aching;Operative site guarding Pain Intervention(s): Limited activity within patient's tolerance;Monitored during session;Repositioned    Home Living Family/patient expects to be discharged to:: Private residence Living Arrangements: Spouse/significant  other Available Help at Discharge: Family Type of Home: House Home Access: Stairs to enter   Home Layout: Two level Home Equipment: None Additional Comments: Reports she plans to stay on 1st floor; only half bath on 1st floor.     Prior Function Level of Independence: Independent          PT Goals (current goals can now be found in the care plan section) Acute Rehab PT Goals Patient Stated Goal: to decrease pain  PT Goal Formulation: With patient Time For Goal Achievement: 02/09/19 Potential to Achieve Goals: Good Progress towards PT goals: Progressing toward goals    Frequency    Min 5X/week      PT Plan Current plan remains appropriate    Co-evaluation              AM-PAC PT "6 Clicks" Mobility   Outcome Measure  Help needed turning from your back to your side while in a flat bed without using bedrails?: A Little Help needed moving from lying on your back to sitting on the side of a flat bed without using bedrails?: A Little Help needed moving to and from a bed to a chair (including a wheelchair)?: A Little Help needed standing up from a chair using your arms (e.g., wheelchair or bedside chair)?: A Little Help needed to walk in hospital room?: A Little Help needed climbing 3-5 steps with a railing? : A Lot 6 Click Score: 17    End of Session Equipment Utilized During Treatment: Gait belt;Back brace Activity Tolerance: Patient limited by pain Patient left: with call bell/phone within reach;Other (comment)(Sitting on BSC to wash up at sink ) Nurse Communication: Mobility status PT Visit Diagnosis: Other abnormalities of gait and mobility (R26.89);Muscle weakness (generalized) (M62.81);Pain Pain - part of body: (back)     Time: [id number] PT Time Calculation (min) (ACUTE ONLY): 24 min  Charges:  $Gait Training: 23-37 mins                     Rolinda Roan, PT, DPT Acute Rehabilitation Services Pager: [phone] Office: [phone]    Thelma Comp 01/27/2019, 9:50 AM

## 2019-01-28 MED ORDER — OXYCODONE-ACETAMINOPHEN 5-325 MG PO TABS: 1.0000 | ORAL_TABLET | ORAL | 0 refills | Status: AC | PRN

## 2019-01-28 MED ORDER — METHOCARBAMOL 500 MG PO TABS: 500.0000 mg | ORAL_TABLET | Freq: Four times a day (QID) | ORAL | 0 refills | Status: DC | PRN

## 2019-01-28 NOTE — Plan of Care (Signed)
Patient alert and oriented, mae's well, voiding adequate amount of urine, swallowing without difficulty, no c/o pain at time of discharge. Patient discharged home with family. Script and discharged instructions given to patient. Patient and family stated understanding of instructions given. Patient has an appointment with Dr. Jones °

## 2019-01-28 NOTE — Progress Notes (Signed)
Physical Therapy Treatment Patient Details Name: Savannah Taylor MRN: [id number] DOB: 06-07-1955 Today's Date: 01/28/2019    History of Present Illness Pt is a 64 y/o female s/p L4-S1 PLIF. PMH includes asthma, HTN, seizure, and TIA.     PT Comments    Pt progressing well with post-op mobility. She was able to demonstrate transfers and ambulation with gross supervision for safety and RW for support. Pt was educated on precautions, brace application/wearing schedule, appropriate activity progression, and car transfer. Will continue to follow.       Follow Up Recommendations  No PT follow up;Supervision for mobility/OOB     Equipment Recommendations  Rolling walker with 5" wheels;3in1 (PT)    Recommendations for Other Services       Precautions / Restrictions Precautions Precautions: Back Precaution Booklet Issued: Yes (comment) Precaution Comments: Reviewed precautions verbally and pt was cued for maintenance of precautions during functional mobility.  Required Braces or Orthoses: Spinal Brace Spinal Brace: Lumbar corset;Applied in sitting position Restrictions Weight Bearing Restrictions: No    Mobility  Bed Mobility               General bed mobility comments: Pt was sitting up in chair when PT arrived.   Transfers Overall transfer level: Needs assistance Equipment used: None;Rolling walker (2 wheeled) Transfers: Sit to/from Stand Sit to Stand: Supervision         General transfer comment: Light supervision for safety. Good hand placement on seated surface for safety.   Ambulation/Gait Ambulation/Gait assistance: Supervision Gait Distance (Feet): 250 Feet Assistive device: Rolling walker (2 wheeled);None Gait Pattern/deviations: Step-through pattern;Decreased stride length Gait velocity: Decreased  Gait velocity interpretation: <1.8 ft/sec, indicate of risk for recurrent falls General Gait Details: Very slow and guarded. Initially walking in the hall  without AD. Balance appeared good but she fatigued quickly. RW use at end of gait training for energy conservation and pain control.    Stairs             Wheelchair Mobility    Modified Rankin (Stroke Patients Only)       Balance Overall balance assessment: Needs assistance Sitting-balance support: No upper extremity supported;Feet supported Sitting balance-Leahy Scale: Good     Standing balance support: Bilateral upper extremity supported;During functional activity Standing balance-Leahy Scale: Fair Standing balance comment: Able to maintain standing balance without UE support.                             Cognition Arousal/Alertness: Awake/alert Behavior During Therapy: WFL for tasks assessed/performed Overall Cognitive Status: Within Functional Limits for tasks assessed                                        Exercises      General Comments        Pertinent Vitals/Pain Pain Assessment: Faces Faces Pain Scale: Hurts even more Pain Location: back Pain Descriptors / Indicators: Aching;Operative site guarding Pain Intervention(s): Limited activity within patient's tolerance;Monitored during session;Repositioned    Home Living                      Prior Function            PT Goals (current goals can now be found in the care plan section) Acute Rehab PT Goals Patient Stated Goal: to decrease pain  PT Goal Formulation: With patient Time For Goal Achievement: 02/09/19 Potential to Achieve Goals: Good Progress towards PT goals: Progressing toward goals    Frequency    Min 5X/week      PT Plan Current plan remains appropriate    Co-evaluation              AM-PAC PT "6 Clicks" Mobility   Outcome Measure  Help needed turning from your back to your side while in a flat bed without using bedrails?: A Little Help needed moving from lying on your back to sitting on the side of a flat bed without using  bedrails?: A Little Help needed moving to and from a bed to a chair (including a wheelchair)?: A Little Help needed standing up from a chair using your arms (e.g., wheelchair or bedside chair)?: A Little Help needed to walk in hospital room?: A Little Help needed climbing 3-5 steps with a railing? : A Lot 6 Click Score: 17    End of Session Equipment Utilized During Treatment: Gait belt;Back brace Activity Tolerance: Patient limited by pain Patient left: with call bell/phone within reach;in chair Nurse Communication: Mobility status PT Visit Diagnosis: Other abnormalities of gait and mobility (R26.89);Muscle weakness (generalized) (M62.81);Pain Pain - part of body: (back)     Time: [id number] PT Time Calculation (min) (ACUTE ONLY): 23 min  Charges:  $Gait Training: 23-37 mins                     Rolinda Roan, PT, DPT Acute Rehabilitation Services Pager: [phone] Office: [phone]    Thelma Comp 01/28/2019, 9:20 AM

## 2019-01-28 NOTE — Discharge Summary (Signed)
Physician Discharge Summary  Patient ID: Savannah Taylor MRN: [id number] DOB/AGE: 63-Jul-1957 63 y.o.  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 The patient tolerated the procedure well and was taken to the recovery room and then to the floor in stable condition. The hospital course was routine. There were no complications. The wound remained clean dry and intact. Pt had appropriate back soreness. No complaints of leg pain or new N/T/W. The patient remained afebrile with stable vital signs, and tolerated a regular diet. The patient continued to increase activities, and pain was well controlled with oral pain medications.   Consults: None  Significant Diagnostic Studies:  Results for orders placed or performed during the hospital encounter of 01/22/19  SARS CORONAVIRUS 2 (TAT 6-24 HRS) Nasopharyngeal Nasopharyngeal Swab   Specimen: Nasopharyngeal Swab  Result Value Ref Range   SARS Coronavirus 2 NEGATIVE NEGATIVE    Chest 2 View  Result Date: 01/22/2019 CLINICAL DATA:  Respiratory distress. EXAM: CHEST - 2 VIEW COMPARISON:  February 25, 2014 FINDINGS: There is no evidence of acute infiltrate, pleural effusion or pneumothorax. The heart size and mediastinal contours are within normal limits. Multilevel degenerative changes seen throughout the thoracic spine. IMPRESSION: No active cardiopulmonary disease. Electronically Signed   By: Virgina Norfolk M.D.   On: 01/22/2019 15:34   DG Lumbar Spine 2-3 Views  Result Date: 01/26/2019 CLINICAL DATA:  Lumbar fusion EXAM: LUMBAR SPINE - 2-3 VIEW; DG C-ARM 1-60 MIN FLUOROSCOPY TIME:  1 minutes 4 seconds  COMPARISON:  None. FINDINGS: Frontal and lateral intraoperative radiographs demonstrate fusion at L4-S1. Rods and pedicle screws are present at these levels. Interbody fusion devices are present at L4-L5 and L5-S1 disc levels. IMPRESSION: Fluoroscopic guidance for L4-S1 fusion. Electronically Signed   By: Macy Mis M.D.   On: 01/26/2019 11:44   DG C-Arm 1-60 Min  Result Date: 01/26/2019 CLINICAL DATA:  Lumbar fusion EXAM: LUMBAR SPINE - 2-3 VIEW; DG C-ARM 1-60 MIN FLUOROSCOPY TIME:  1 minutes 4 seconds COMPARISON:  None. FINDINGS: Frontal and lateral intraoperative radiographs demonstrate fusion at L4-S1. Rods and pedicle screws are present at these levels. Interbody fusion devices are present at L4-L5 and L5-S1 disc levels. IMPRESSION: Fluoroscopic guidance for L4-S1 fusion. Electronically Signed   By: Macy Mis M.D.   On: 01/26/2019 11:44    Antibiotics:  Anti-infectives (From admission, onward)   Start     Dose/Rate Route Frequency Ordered Stop   01/26/19 1600  ceFAZolin (ANCEF) IVPB 2g/100 mL premix     2 g 200 mL/hr over 30 Minutes Intravenous Every 8 hours 01/26/19 1225 01/26/19 2350   01/26/19 0835  bacitracin 50,000 Units in sodium chloride 0.9 % 500 mL irrigation  Status:  Discontinued       As needed 01/26/19 0835 01/26/19 1124   01/26/19 0630  ceFAZolin (ANCEF) IVPB 2g/100 mL premix     2 g 200 mL/hr over 30 Minutes Intravenous On call to O.R. 01/26/19 0622 01/26/19 0758      Discharge Exam: Blood pressure 117/75, pulse 82, temperature 98.7 F (37.1 C), temperature source Oral, resp. rate 18, height 5\' 5"  (1.651 m), weight 96.3 kg, SpO2 98 %. Neurologic: Grossly normal  Ambulating and voiding well, incision cdi  Discharge Medications:   Allergies as of 01/28/2019      Reactions   Bystolic [nebivolol Hcl] Palpitations, Other (See Comments)   Morphine And Related Itching   Naproxen Hives, Itching      Medication List    TAKE these medications   Aimovig 140 MG/ML  Soaj Generic drug: Erenumab-aooe INJECT  140  MG SUBCUTANEOUSLY  EVERY 30 DAYS What changed: See the new instructions.   Aimovig 140 MG/ML Soaj Generic drug: Erenumab-aooe Inject 140 mg into the skin every 30 (thirty) days. What changed: Another medication with the same name was changed. Make sure you understand how and when to take each.   albuterol (2.5 MG/3ML) 0.083% nebulizer solution Commonly known as: PROVENTIL Take 2.5 mg by nebulization every 6 (six) hours as needed for wheezing or shortness of breath.   albuterol 108 (90 Base) MCG/ACT inhaler Commonly known as: ProAir HFA Inhale 2 puffs into the lungs every 4 (four) hours as needed for wheezing or shortness of breath.   ALPRAZolam 1 MG tablet Commonly known as: XANAX Take 0.5 mg by mouth at bedtime as needed for anxiety.   amLODipine 5 MG tablet Commonly known as: NORVASC Take 5 mg by mouth daily.   Biotin 1 MG Caps Take 1 mg by mouth daily.   buPROPion 150 MG 24 hr tablet Commonly known as: WELLBUTRIN XL Take 150 mg by mouth daily.   cholecalciferol 25 MCG (1000 UNIT) tablet Commonly known as: VITAMIN D3 Take 1,000 Units by mouth daily.   clonazePAM 0.5 MG tablet Commonly known as: KLONOPIN Take 0.5-1 mg by mouth at bedtime.   gabapentin 300 MG capsule Commonly known as: NEURONTIN Take 600 mg by mouth at bedtime as needed (pain).   HYDROcodone-acetaminophen 5-325 MG tablet Commonly known as: NORCO/VICODIN Take 1 tablet by mouth every 6 (six) hours as needed for moderate pain.   methocarbamol 500 MG tablet Commonly known as: ROBAXIN Take 1 tablet (500 mg total) by mouth every 6 (six) hours as needed for muscle spasms.   mometasone-formoterol 100-5 MCG/ACT Aero Commonly known as: DULERA Take 2 puffs first thing in am and then another 2 puffs about 12 hours later. What changed:   how much to take  how to take this  when to take this  reasons to take this  additional instructions    oxyCODONE-acetaminophen 5-325 MG tablet Commonly known as: Percocet Take 1 tablet by mouth every 4 (four) hours as needed for severe pain.   topiramate 50 MG tablet Commonly known as: TOPAMAX Take 50-75 mg by mouth See admin instructions. Take 75 mg by mouth in the morning and 50 mg at bedtime   topiramate 100 MG tablet Commonly known as: TOPAMAX Take 100 mg by mouth 2 (two) times daily as needed.   topiramate 100 MG tablet Commonly known as: Topamax Take 1 tablet (100 mg total) by mouth at bedtime. Take one tablet 50mg  at bedtime for 2 weeks then increase to 100mg  at bedtime,   Ubrogepant 50 MG Tabs Commonly known as: Ubrelvy Take 50 mg by mouth daily as needed. Take 1 tablet at onset of headache, may repeat 1 tablet in 2 hours, no more than 2 tablets in 24 hours   venlafaxine XR 75 MG 24 hr capsule Commonly known as: Effexor XR Take 1 capsule (75 mg total) by mouth daily with breakfast.   vitamin B-12 1000 MCG tablet Commonly known as: CYANOCOBALAMIN Take 1,000 mcg by mouth daily.  Zetia 10 MG tablet Generic drug: ezetimibe Take 10 mg by mouth daily.   zolmitriptan 5 MG nasal solution Commonly known as: Zomig Use 1 spray in nostril at onset if headache. May repeat in 2 hours. Max twice daily.            Durable Medical Equipment  (From admission, onward)         Start     Ordered   01/28/19 0805  DME 3-in-1  Once     01/28/19 0806   01/28/19 0805  DME Walker  Once    Question Answer Comment  Walker: With [place]   Patient needs a walker to treat with the following condition S/P lumbar fusion      01/28/19 0806   01/26/19 1225  DME Walker rolling  Once    Question:  Patient needs a walker to treat with the following condition  Answer:  S/P lumbar fusion   01/26/19 1225   01/26/19 1225  DME 3 n 1  Once     01/26/19 1225          Disposition: home   Final Dx: PLIF L4-5, L5-S1  Discharge Instructions    Call MD for:  difficulty breathing,  headache or visual disturbances   Complete by: As directed    Call MD for:  hives   Complete by: As directed    Call MD for:  persistant nausea and vomiting   Complete by: As directed    Call MD for:  redness, tenderness, or signs of infection (pain, swelling, redness, odor or green/yellow discharge around incision site)   Complete by: As directed    Call MD for:  severe uncontrolled pain   Complete by: As directed    Call MD for:  temperature >100.4   Complete by: As directed    Diet - low sodium heart healthy   Complete by: As directed    Driving Restrictions   Complete by: As directed    No driving for 2 weeks, no riding in the car for 1 week   Increase activity slowly   Complete by: As directed    Lifting restrictions   Complete by: As directed    No lifting more than 8 lbs   Remove dressing in 24 hours   Complete by: As directed          Signed: Ocie Cornfield Aune Adami 01/28/2019, 8:06 AM

## 2019-02-09 ENCOUNTER — Other Ambulatory Visit: Payer: Self-pay

## 2019-02-09 DIAGNOSIS — Z20822 Contact with and (suspected) exposure to covid-19: Secondary | ICD-10-CM

## 2019-02-10 LAB — NOVEL CORONAVIRUS, NAA: SARS-CoV-2, NAA: NOT DETECTED

## 2019-03-03 ENCOUNTER — Other Ambulatory Visit: Payer: Self-pay

## 2019-03-03 MED ORDER — AIMOVIG 140 MG/ML ~~LOC~~ SOAJ: 140.0000 mg | SUBCUTANEOUS | 3 refills | Status: DC

## 2019-03-05 ENCOUNTER — Telehealth: Payer: Self-pay | Admitting: Family Medicine

## 2019-03-05 NOTE — Telephone Encounter (Signed)
Pt has called to report that a PA is needed on her AIMOVIG 140 MG/ML SOAJ, please call

## 2019-03-10 ENCOUNTER — Other Ambulatory Visit: Payer: Self-pay | Admitting: Neurological Surgery

## 2019-03-10 DIAGNOSIS — M545 Low back pain, unspecified: Secondary | ICD-10-CM

## 2019-03-11 ENCOUNTER — Other Ambulatory Visit: Payer: Self-pay | Admitting: *Deleted

## 2019-03-11 MED ORDER — AIMOVIG 140 MG/ML ~~LOC~~ SOAJ: 140.0000 mg | SUBCUTANEOUS | 3 refills | Status: DC

## 2019-03-11 NOTE — Telephone Encounter (Signed)
Started a PA via [facility]  Key: [id number]  Awaiting response.

## 2019-03-14 ENCOUNTER — Ambulatory Visit: Payer: Medicare Other | Attending: Internal Medicine

## 2019-03-14 DIAGNOSIS — Z23 Encounter for immunization: Secondary | ICD-10-CM

## 2019-03-14 NOTE — Progress Notes (Signed)
   Covid-19 Vaccination Clinic  Name:  Savannah Taylor    MRN: [id number] DOB: 01-Jan-1956  03/14/2019  Ms. Shaft was observed post Covid-19 immunization for 15 minutes without incident. She was provided with Vaccine Information Sheet and instruction to access the V-Safe system.   [name] was instructed to call 911 with any severe reactions post vaccine: Marland Kitchen Difficulty breathing  . Swelling of face and throat  . A fast heartbeat  . A bad rash all over body  . Dizziness and weakness   Immunizations Administered    Name Date Dose VIS Date Route   Pfizer COVID-19 Vaccine 03/14/2019  5:11 PM 0.3 mL 12/12/2018 Intramuscular   Manufacturer: [place]   Lot: [id number]   [place]: [id number]

## 2019-03-20 ENCOUNTER — Ambulatory Visit
Admission: RE | Admit: 2019-03-20 | Discharge: 2019-03-20 | Disposition: A | Discharge status: Home / Self Care | Payer: Medicare Other | Source: Ambulatory Visit | Attending: Neurological Surgery | Admitting: Neurological Surgery

## 2019-03-20 DIAGNOSIS — M545 Low back pain, unspecified: Secondary | ICD-10-CM

## 2019-04-07 ENCOUNTER — Ambulatory Visit: Payer: Medicare Other | Attending: Internal Medicine

## 2019-04-07 ENCOUNTER — Ambulatory Visit: Payer: Medicare Other

## 2019-04-07 DIAGNOSIS — Z23 Encounter for immunization: Secondary | ICD-10-CM

## 2019-04-07 NOTE — Progress Notes (Signed)
   Covid-19 Vaccination Clinic  Name:  Savannah Taylor    MRN: [id number] DOB: May 04, 1955  04/07/2019  Savannah Taylor was observed post Covid-19 immunization for 15 minutes without incident. She was provided with Vaccine Information Sheet and instruction to access the V-Safe system.   Savannah Taylor was instructed to call 911 with any severe reactions post vaccine: Marland Kitchen Difficulty breathing  . Swelling of face and throat  . A fast heartbeat  . A bad rash all over body  . Dizziness and weakness   Immunizations Administered    Name Date Dose VIS Date Route   Pfizer COVID-19 Vaccine 04/07/2019 12:01 PM 0.3 mL 12/12/2018 Intramuscular   Manufacturer: [employer]   Lot: [id number]   [place]: [id number]

## 2019-05-26 ENCOUNTER — Other Ambulatory Visit: Payer: Self-pay | Admitting: Family Medicine

## 2019-05-26 DIAGNOSIS — E2839 Other primary ovarian failure: Secondary | ICD-10-CM

## 2019-07-13 ENCOUNTER — Telehealth: Payer: Self-pay

## 2019-07-13 NOTE — Telephone Encounter (Signed)
Called patient and scheduled FU for soonest, put her on wait list. Patient verbalized understanding, appreciation.

## 2019-07-13 NOTE — Telephone Encounter (Signed)
Pt left a VM asking for a call to schedule an appt for her migraines. Please call.

## 2019-07-16 ENCOUNTER — Other Ambulatory Visit: Payer: Self-pay | Admitting: Student

## 2019-07-16 DIAGNOSIS — M545 Low back pain, unspecified: Secondary | ICD-10-CM

## 2019-08-04 ENCOUNTER — Ambulatory Visit
Admission: RE | Admit: 2019-08-04 | Discharge: 2019-08-04 | Disposition: A | Discharge status: Home / Self Care | Payer: Medicare Other | Source: Ambulatory Visit | Attending: Student | Admitting: Student

## 2019-08-04 DIAGNOSIS — M545 Low back pain, unspecified: Secondary | ICD-10-CM

## 2019-08-14 ENCOUNTER — Ambulatory Visit
Admission: RE | Admit: 2019-08-14 | Discharge: 2019-08-14 | Disposition: A | Discharge status: Home / Self Care | Payer: Medicare Other | Source: Ambulatory Visit | Attending: Family Medicine | Admitting: Family Medicine

## 2019-08-14 ENCOUNTER — Other Ambulatory Visit: Payer: Self-pay

## 2019-08-14 DIAGNOSIS — E2839 Other primary ovarian failure: Secondary | ICD-10-CM

## 2019-09-22 ENCOUNTER — Other Ambulatory Visit: Payer: Medicare Other

## 2019-09-22 DIAGNOSIS — Z20822 Contact with and (suspected) exposure to covid-19: Secondary | ICD-10-CM

## 2019-09-24 LAB — SARS-COV-2, NAA 2 DAY TAT

## 2019-09-24 LAB — NOVEL CORONAVIRUS, NAA: SARS-CoV-2, NAA: NOT DETECTED

## 2019-09-30 ENCOUNTER — Encounter: Payer: Self-pay | Admitting: Family Medicine

## 2019-09-30 ENCOUNTER — Telehealth: Payer: Self-pay | Admitting: Family Medicine

## 2019-09-30 ENCOUNTER — Ambulatory Visit: Payer: Medicare Other | Admitting: Family Medicine

## 2019-09-30 VITALS — BP 128/86 | HR 77 | Ht 65.0 in | Wt 210.0 lb

## 2019-09-30 DIAGNOSIS — G43709 Chronic migraine without aura, not intractable, without status migrainosus: Secondary | ICD-10-CM | POA: Diagnosis not present

## 2019-09-30 MED ORDER — AIMOVIG 140 MG/ML ~~LOC~~ SOAJ: 140.0000 mg | SUBCUTANEOUS | 3 refills | Status: DC

## 2019-09-30 MED ORDER — NURTEC 75 MG PO TBDP: 75.0000 mg | ORAL_TABLET | Freq: Every day | ORAL | 11 refills | Status: DC | PRN

## 2019-09-30 NOTE — Patient Instructions (Addendum)
We will reorder Amovig for you to take every 30 days. I will try to get Nurtec covered.   Think about sleep study. Stay well hydrated. Follow up closely with PCP.   Follow up in 3 months    Sleep Apnea Sleep apnea affects breathing during sleep. It causes breathing to stop for a short time or to become shallow. It can also increase the risk of:  Heart attack.  Stroke.  Being very overweight (obese).  Diabetes.  Heart failure.  Irregular heartbeat. The goal of treatment is to help you breathe normally again. What are the causes? There are three kinds of sleep apnea:  Obstructive sleep apnea. This is caused by a blocked or collapsed airway.  Central sleep apnea. This happens when the brain does not send the right signals to the muscles that control breathing.  Mixed sleep apnea. This is a combination of obstructive and central sleep apnea. The most common cause of this condition is a collapsed or blocked airway. This can happen if:  Your throat muscles are too relaxed.  Your tongue and tonsils are too large.  You are overweight.  Your airway is too small. What increases the risk?  Being overweight.  Smoking.  Having a small airway.  Being older.  Being female.  Drinking alcohol.  Taking medicines to calm yourself (sedatives or tranquilizers).  Having family members with the condition. What are the signs or symptoms?  Trouble staying asleep.  Being sleepy or tired during the day.  Getting angry a lot.  Loud snoring.  Headaches in the morning.  Not being able to focus your mind (concentrate).  Forgetting things.  Less interest in sex.  Mood swings.  Personality changes.  Feelings of sadness (depression).  Waking up a lot during the night to pee (urinate).  Dry mouth.  Sore throat. How is this diagnosed?  Your medical history.  A physical exam.  A test that is done when you are sleeping (sleep study). The test is most often done in  a sleep lab but may also be done at home. How is this treated?   Sleeping on your side.  Using a medicine to get rid of mucus in your nose (decongestant).  Avoiding the use of alcohol, medicines to help you relax, or certain pain medicines (narcotics).  Losing weight, if needed.  Changing your diet.  Not smoking.  Using a machine to open your airway while you sleep, such as: ? An oral appliance. This is a mouthpiece that shifts your lower jaw forward. ? A CPAP device. This device blows air through a mask when you breathe out (exhale). ? An EPAP device. This has valves that you put in each nostril. ? A BPAP device. This device blows air through a mask when you breathe in (inhale) and breathe out.  Having surgery if other treatments do not work. It is important to get treatment for sleep apnea. Without treatment, it can lead to:  High blood pressure.  Coronary artery disease.  In men, not being able to have an erection (impotence).  Reduced thinking ability. Follow these instructions at home: Lifestyle  Make changes that your doctor recommends.  Eat a healthy diet.  Lose weight if needed.  Avoid alcohol, medicines to help you relax, and some pain medicines.  Do not use any products that contain nicotine or tobacco, such as cigarettes, e-cigarettes, and chewing tobacco. If you need help quitting, ask your doctor. General instructions  Take over-the-counter and prescription medicines only  as told by your doctor.  If you were given a machine to use while you sleep, use it only as told by your doctor.  If you are having surgery, make sure to tell your doctor you have sleep apnea. You may need to bring your device with you.  Keep all follow-up visits as told by your doctor. This is important. Contact a doctor if:  The machine that you were given to use during sleep bothers you or does not seem to be working.  You do not get better.  You get worse. Get help right  away if:  Your chest hurts.  You have trouble breathing in enough air.  You have an uncomfortable feeling in your back, arms, or stomach.  You have trouble talking.  One side of your body feels weak.  A part of your face is hanging down. These symptoms may be an emergency. Do not wait to see if the symptoms will go away. Get medical help right away. Call your local emergency services (911 in the [place] Do not drive yourself to the hospital. Summary  This condition affects breathing during sleep.  The most common cause is a collapsed or blocked airway.  The goal of treatment is to help you breathe normally while you sleep. This information is not intended to replace advice given to you by your health care provider. Make sure you discuss any questions you have with your health care provider. Document Revised: 10/04/2017 Document Reviewed: 08/13/2017 Elsevier Patient Education  [place].    Migraine Headache A migraine headache is a very strong throbbing pain on one side or both sides of your head. This type of headache can also cause other symptoms. It can last from 4 hours to 3 days. Talk with your doctor about what things may bring on (trigger) this condition. What are the causes? The exact cause of this condition is not known. This condition may be triggered or caused by:  Drinking alcohol.  Smoking.  Taking medicines, such as: ? Medicine used to treat chest pain (nitroglycerin). ? Birth control pills. ? Estrogen. ? Some blood pressure medicines.  Eating or drinking certain products.  Doing physical activity. Other things that may trigger a migraine headache include:  Having a menstrual period.  Pregnancy.  Hunger.  Stress.  Not getting enough sleep or getting too much sleep.  Weather changes.  Tiredness (fatigue). What increases the risk?  Being 20-53 years old.  Being female.  Having a family history of migraine headaches.  Being  Caucasian.  Having depression or anxiety.  Being very overweight. What are the signs or symptoms?  A throbbing pain. This pain may: ? Happen in any area of the head, such as on one side or both sides. ? Make it hard to do daily activities. ? Get worse with physical activity. ? Get worse around bright lights or loud noises.  Other symptoms may include: ? Feeling sick to your stomach (nauseous). ? Vomiting. ? Dizziness. ? Being sensitive to bright lights, loud noises, or smells.  Before you get a migraine headache, you may get warning signs (an aura). An aura may include: ? Seeing flashing lights or having blind spots. ? Seeing bright spots, halos, or zigzag lines. ? Having tunnel vision or blurred vision. ? Having numbness or a tingling feeling. ? Having trouble talking. ? Having weak muscles.  Some people have symptoms after a migraine headache (postdromal phase), such as: ? Tiredness. ? Trouble thinking (concentrating). How is  this treated?  Taking medicines that: ? Relieve pain. ? Relieve the feeling of being sick to your stomach. ? Prevent migraine headaches.  Treatment may also include: ? Having acupuncture. ? Avoiding foods that bring on migraine headaches. ? Learning ways to control your body functions (biofeedback). ? Therapy to help you know and deal with negative thoughts (cognitive behavioral therapy). Follow these instructions at home: Medicines  Take over-the-counter and prescription medicines only as told by your doctor.  Ask your doctor if the medicine prescribed to you: ? Requires you to avoid driving or using heavy machinery. ? Can cause trouble pooping (constipation). You may need to take these steps to prevent or treat trouble pooping:  Drink enough fluid to keep your pee (urine) pale yellow.  Take over-the-counter or prescription medicines.  Eat foods that are high in fiber. These include beans, whole grains, and fresh fruits and  vegetables.  Limit foods that are high in fat and sugar. These include fried or sweet foods. Lifestyle  Do not drink alcohol.  Do not use any products that contain nicotine or tobacco, such as cigarettes, e-cigarettes, and chewing tobacco. If you need help quitting, ask your doctor.  Get at least 8 hours of sleep every night.  Limit and deal with stress. General instructions      Keep a journal to find out what may bring on your migraine headaches. For example, write down: ? What you eat and drink. ? How much sleep you get. ? Any change in what you eat or drink. ? Any change in your medicines.  If you have a migraine headache: ? Avoid things that make your symptoms worse, such as bright lights. ? It may help to lie down in a dark, quiet room. ? Do not drive or use heavy machinery. ? Ask your doctor what activities are safe for you.  Keep all follow-up visits as told by your doctor. This is important. Contact a doctor if:  You get a migraine headache that is different or worse than others you have had.  You have more than 15 headache days in one month. Get help right away if:  Your migraine headache gets very bad.  Your migraine headache lasts longer than 72 hours.  You have a fever.  You have a stiff neck.  You have trouble seeing.  Your muscles feel weak or like you cannot control them.  You start to lose your balance a lot.  You start to have trouble walking.  You pass out (faint).  You have a seizure. Summary  A migraine headache is a very strong throbbing pain on one side or both sides of your head. These headaches can also cause other symptoms.  This condition may be treated with medicines and changes to your lifestyle.  Keep a journal to find out what may bring on your migraine headaches.  Contact a doctor if you get a migraine headache that is different or worse than others you have had.  Contact your doctor if you have more than 15 headache  days in a month. This information is not intended to replace advice given to you by your health care provider. Make sure you discuss any questions you have with your health care provider. Document Revised: 04/11/2018 Document Reviewed: 01/30/2018 Elsevier Patient Education  [place].

## 2019-09-30 NOTE — Telephone Encounter (Signed)
Pt has called to report that she has been notified by her insurance that the Rimegepant Sulfate (NURTEC) 75 MG TBDP is not covered, please call.

## 2019-09-30 NOTE — Progress Notes (Addendum)
PATIENT: Savannah Taylor DOB: 03-Mar-1955  REASON FOR VISIT: follow up HISTORY FROM: patient  Chief Complaint  Patient presents with  . Follow-up    rm1  . Migraine    Pt said she hasno new sx but still has migraines.     HISTORY OF PRESENT ILLNESS: Today 09/30/19 Savannah Taylor is a 64 y.o. female here today for follow up migraines. She continues to have regular headaches. She has not reached out to [place] to report headaches have continues. She has some sort of headache every day. She has at least 16-20 migraines every month. She reports taking Amovig every 3 months. Reports that she has only gotten 1 injection every three months. Amovig helps significantly after injection. She does not use abortive medication. She did not feel Ubrelvy helped. She did not continue venlafaxine. She recently lost her mother. She continues to grieve her loss. She cries frequently. PCP has been treating depression with Wellbutrin but she hs not taken consistently. She is not sleeping well. She is always tired. She wakes frequently at night with headaches or to use the bathroom. She does snore. We have discussed concerns of sleep apnea in the past but she has been reluctant to consider workup. Her son has sleep apnea.    HISTORY: (copied from my note on 07/30/2018)  Savannah Taylor is a 64 y.o. female here today for follow up for migraines and concerns of sleep apnea. She reports that headaches are unchanged. She continues to have frequent migraines but feels they are not as intense. She is caring for her mother with end stage dementia and cancer. She is taking venlafaxine 75mg  sporadically when she feels she needs it. She has taken Ubrelvy once but is insure if it helped.   She has not followed through with sleep apnea evaluation. She was uncertain if insurance would cover it. She is interested in continuing with evaluation.   HISTORY: (copied from my note on 03/27/2018)  Savannah Taylor a 64  y.o.femalefor follow up. She is taking topiramate 100mg  daily and Amovig 140mg  monthly for management of migraines.She reports that her headaches continue to occur daily. She has mild pain with most headaches but does report at least 10 migraines per month. Migraines are typically unilateral and associated with nausea, light and sound sensitivity. Last month she took an extra dose of Aimovig and reports that that had helped some. She states that the first 2 to 3 weeks after taking Aimovig she feels that she is well managed, however, the next week or so her migraines are pretty bad. She is taking Zomig for abortive therapybutreports it is no longer helping. She has been on multiple preventative medications including Topamax, verapamil, amitriptyline, Depakote, Inderal, Imitrex injections, Maxalt, Neurontin and Botox. She is also tried Effexor and Cymbalta. Headaches have not changed in nature. Last imaging reviewed was a CT of her head in 2014 that was unremarkable.  She reports about 10 years ago she had a work-up for sleep apnea. She is unsure of the results of her test but does mention that she was told if she ever reaches a deep stage of sleep. She does endorse snoring, frequent waking, nocturia, dry mouth, occasional morning headaches, daytime sleepiness and fatigue. She is interested in an updated sleep study.   History (copied from [employer] note dated06/18/19)  Ms. [name] a 64 year old female with a history of migraine. She states that she had a severe headache for the last 2 to 3  weeks. She is on last day of her prednisone Dosepak. Reports prednisone has been beneficial. Reports that she still has a mild headache but not as severe as it was before. She states that her headaches typically occur on the left side. She always has phonophobia with her headaches. She remains on Topamax. She also uses Zomig as needed. She reports that Aimovigworked well for her  migraines in the first 2 months. She states by the third month she began having a constant dull headache. She is currently taking the 70 mg injection. She returns today for evaluation.  HISTORY:Savannah Taylor a 64 y.o.femalehere as a referral from Dr. Bonnita Nasuti migraines.Past medical history of chronic migraines, depression, hypertension, high cholesterol,B12 deficiency, chronic insomnia, chronic kidney disease, multilevel degenerative disease.She has been seeing Dr. Domingo Cocking for nerve injections. Migraines since a teenager without any inciting event or head trauma.She has a family history of migraines,Her son and paternal aunt have migraines. She has been having [facility]sof [facility],she went every month and then every 2 months(likely nerve block)but she also says that she received Botox which helped but insurance would not pay anymore. Now she just goes as needed.For nerve blocks.She hasheadaches15 days a month. 8-10 are migrainous.This is been ongoing for the last 6 months.Her migraines feele like her head isswelling on one side, but they can move around, one eye droops, feels tight, she can hear her heart beating in her head especially when laying down, more pounding than pressure, she can't stand sound or noise. Dark rooms help. Being quiet helps. They can last for days straight. They can be severe with nausea and vomiting on average out of 10 can be 6-7/10. She has been to "so many doctors and so many places" and the doctors get frustrated because they can't help. She has been to headaches clinics.She was told she was faking in the past. She has tried everything per patient. No other associated symptoms or focal neurologic deficits or complaints. No aura. No medication overuse.  Tried: Topamax, verapamil, amitriptyline, depakote, inderal, she has had imitrex injections, maxalt, zomig. Neurontin, botox helped a lot but then her physician said insurance  wouldn;t pay, effexor, cymbalta.   Reviewed notes, labs and imaging from outside physicians, which showed:  BUN 12 and creatinine 1.15 taken 04/05/2015. TSH 0.810 January 2017.  CT of the head 4/2014showed No acute intracranial abnormalities including mass lesion or mass effect, hydrocephalus, extra-axial fluid collection, midline shift, hemorrhage, or acute infarction, large ischemic events (personally reviewed images)    REVIEW OF SYSTEMS: Out of a complete 14 system review of symptoms, the patient complains only of the following symptoms, headaches, depression and all other reviewed systems are negative.  ALLERGIES: Allergies  Allergen Reactions  . Bystolic [Nebivolol Hcl] Palpitations and Other (See Comments)  . Morphine And Related Itching  . Naproxen Hives and Itching    HOME MEDICATIONS: Outpatient Medications Prior to Visit  Medication Sig Dispense Refill  . albuterol (PROAIR HFA) 108 (90 Base) MCG/ACT inhaler Inhale 2 puffs into the lungs every 4 (four) hours as needed for wheezing or shortness of breath. 1 Inhaler 1  . albuterol (PROVENTIL) (2.5 MG/3ML) 0.083% nebulizer solution Take 2.5 mg by nebulization every 6 (six) hours as needed for wheezing or shortness of breath.    . ALPRAZolam (XANAX) 1 MG tablet Take 0.5 mg by mouth at bedtime as needed for anxiety.     Marland Kitchen amLODipine (NORVASC) 5 MG tablet Take 5 mg by mouth daily.    Marland Kitchen  Biotin 1 MG CAPS Take 1 mg by mouth daily.    Marland Kitchen buPROPion (WELLBUTRIN XL) 150 MG 24 hr tablet Take 150 mg by mouth daily.    . cholecalciferol (VITAMIN D3) 25 MCG (1000 UNIT) tablet Take 1,000 Units by mouth daily.    . clonazePAM (KLONOPIN) 0.5 MG tablet Take 0.5-1 mg by mouth at bedtime.    Marland Kitchen ezetimibe (ZETIA) 10 MG tablet Take 10 mg by mouth daily.    Marland Kitchen gabapentin (NEURONTIN) 300 MG capsule Take 600 mg by mouth at bedtime as needed (pain).     . methocarbamol (ROBAXIN) 500 MG tablet Take 1 tablet (500 mg total) by mouth every 6 (six) hours  as needed for muscle spasms. 40 tablet 0  . mometasone-formoterol (DULERA) 100-5 MCG/ACT AERO Take 2 puffs first thing in am and then another 2 puffs about 12 hours later. (Patient taking differently: Inhale 2 puffs into the lungs 2 (two) times daily as needed for wheezing or shortness of breath. ) 1 Inhaler 11  . oxyCODONE-acetaminophen (PERCOCET) 5-325 MG tablet Take 1 tablet by mouth every 4 (four) hours as needed for severe pain. 20 tablet 0  . vitamin B-12 (CYANOCOBALAMIN) 1000 MCG tablet Take 1,000 mcg by mouth daily.    Eduard Roux (AIMOVIG) 140 MG/ML SOAJ Inject 140 mg into the skin every 30 (thirty) days. 1 pen 3  . Ubrogepant (UBRELVY) 50 MG TABS Take 50 mg by mouth daily as needed. Take 1 tablet at onset of headache, may repeat 1 tablet in 2 hours, no more than 2 tablets in 24 hours 10 tablet 11  . venlafaxine XR (EFFEXOR XR) 75 MG 24 hr capsule Take 1 capsule (75 mg total) by mouth daily with breakfast. 90 capsule 3  . HYDROcodone-acetaminophen (NORCO/VICODIN) 5-325 MG tablet Take 1 tablet by mouth every 6 (six) hours as needed for moderate pain.    Marland Kitchen topiramate (TOPAMAX) 100 MG tablet Take 1 tablet (100 mg total) by mouth at bedtime. Take one tablet 50mg  at bedtime for 2 weeks then increase to 100mg  at bedtime, (Patient not taking: Reported on 01/16/2019) 30 tablet 11  . topiramate (TOPAMAX) 100 MG tablet Take 100 mg by mouth 2 (two) times daily as needed.    . topiramate (TOPAMAX) 50 MG tablet Take 50-75 mg by mouth See admin instructions. Take 75 mg by mouth in the morning and 50 mg at bedtime    . zolmitriptan (ZOMIG) 5 MG nasal solution Use 1 spray in nostril at onset if headache. May repeat in 2 hours. Max twice daily. (Patient not taking: Reported on 01/16/2019) 6 Units 11   No facility-administered medications prior to visit.    PAST MEDICAL HISTORY: Past Medical History:  Diagnosis Date  . Abdominal cramps   . Asthma   . Back problem    BAD DISK IN BACK  . DDD  (degenerative disc disease), lumbosacral   . Gastritis   . GERD (gastroesophageal reflux disease)   . H/O hiatal hernia   . Heart murmur    Years ago  . Hyperlipidemia   . Hypertension   . Knee problem   . Leg cramps   . Migraine    01/22/2019- last one > 1 month ago  . Seizure ([place])    1 many years ago with a migraine  . Shortness of breath   . Stroke [facility])    TIA  slight memory  issues TIA prior to 2010  . Ulcer    STOMACH & SORENESS &  PAIN  . UTI (urinary tract infection) 07/02/2012    PAST SURGICAL HISTORY: Past Surgical History:  Procedure Laterality Date  . ABDOMINAL HYSTERECTOMY    . ARTHROGRAM KNEE Right    MCL tear  . COLONOSCOPY    . ENDOSCOPY  2020   hiatal hernia, gastritis, and some bacteria in stomach  . HEMORRHOID SURGERY      FAMILY HISTORY: Family History  Problem Relation Age of Onset  . Hypertension Mother   . Hyperlipidemia Mother   . Asthma Father   . Cancer Father   . Hypertension Brother   . Breast cancer Neg Hx     SOCIAL HISTORY: Social History   Socioeconomic History  . Marital status: Married    Spouse name: [name]  . Number of children: 3  . Years of education: Some college  . Highest education level: Not on file  Occupational History  . Occupation: works full time  Tobacco Use  . Smoking status: Never Smoker  . Smokeless tobacco: Never Used  Vaping Use  . Vaping Use: Never used  Substance and Sexual Activity  . Alcohol use: No  . Drug use: No  . Sexual activity: Not on file  Other Topics Concern  . Not on file  Social History Narrative   Lives with husband   Caffeine use: Coffee/tea/soda sometimes per patient   Social Determinants of Health   Financial Resource Strain:   . Difficulty of Paying Living Expenses: Not on file  Food Insecurity:   . Worried About Charity fundraiser in the Last Year: Not on file  . Ran Out of Food in the Last Year: Not on file  Transportation Needs:   . Lack of Transportation  (Medical): Not on file  . Lack of Transportation (Non-Medical): Not on file  Physical Activity:   . Days of Exercise per Week: Not on file  . Minutes of Exercise per Session: Not on file  Stress:   . Feeling of Stress : Not on file  Social Connections:   . Frequency of Communication with Friends and Family: Not on file  . Frequency of Social Gatherings with Friends and Family: Not on file  . Attends Religious Services: Not on file  . Active Member of Clubs or Organizations: Not on file  . Attends Archivist Meetings: Not on file  . Marital Status: Not on file  Intimate Partner Violence:   . Fear of Current or Ex-Partner: Not on file  . Emotionally Abused: Not on file  . Physically Abused: Not on file  . Sexually Abused: Not on file      PHYSICAL EXAM  Vitals:   09/30/19 1055  BP: 128/86  Pulse: 77  Weight: 210 lb (95.3 kg)  Height: 5\' 5"  (1.651 m)   Body mass index is 34.95 kg/m.  Generalized: Well developed, in no acute distress  Cardiology: normal rate and rhythm, no murmur noted Respiratory: clear to auscultation bilaterally  Neurological examination  Mentation: Alert oriented to time, place, history taking. Follows all commands speech and language fluent Cranial nerve II-XII: Pupils were equal round reactive to light. Extraocular movements were full, visual field were full  Motor: The motor testing reveals 5 over 5 strength of all 4 extremities. Good symmetric motor tone is noted throughout.   Gait and station: Gait is normal.    DIAGNOSTIC DATA (LABS, IMAGING, TESTING) - I reviewed patient records, labs, notes, testing and imaging myself where available.  No flowsheet data found.  Lab Results  Component Value Date   WBC 4.5 01/22/2019   HGB 13.1 01/22/2019   HCT 40.1 01/22/2019   MCV 92.8 01/22/2019   PLT 217 01/22/2019      Component Value Date/Time   NA 141 01/22/2019 1030   NA 142 08/31/2015 1313   K 3.9 01/22/2019 1030   CL 110  01/22/2019 1030   CO2 23 01/22/2019 1030   GLUCOSE 92 01/22/2019 1030   BUN 6 (L) 01/22/2019 1030   BUN 12 08/31/2015 1313   CREATININE 1.11 (H) 01/22/2019 1030   CALCIUM 9.1 01/22/2019 1030   PROT 7.1 08/31/2015 1313   ALBUMIN 4.4 08/31/2015 1313   AST 14 08/31/2015 1313   ALT 9 08/31/2015 1313   ALKPHOS 74 08/31/2015 1313   BILITOT 0.3 08/31/2015 1313   GFRNONAA 53 (L) 01/22/2019 1030   GFRAA >60 01/22/2019 1030   No results found for: CHOL, HDL, LDLCALC, LDLDIRECT, TRIG, CHOLHDL No results found for: HGBA1C No results found for: VITAMINB12 No results found for: TSH     ASSESSMENT AND PLAN 64 y.o. year old female  has a past medical history of Abdominal cramps, Asthma, Back problem, DDD (degenerative disc disease), lumbosacral, Gastritis, GERD (gastroesophageal reflux disease), H/O hiatal hernia, Heart murmur, Hyperlipidemia, Hypertension, Knee problem, Leg cramps, Migraine, Seizure ([place]), Shortness of breath, Stroke ([place]), Ulcer, and UTI (urinary tract infection) (07/02/2012). here with     ICD-10-CM   1. Chronic migraine w/o aura w/o status migrainosus, not intractable  G43.709     She continues to have regular headaches with at least 15-20 migraine days per month. With review of chart, it appears that Amovig was ordered for 1 injection with 3 refills. She reports that she has taken 1 injection every three months. We have reviewed directions for administration. She was encouraged to take Wellbutrin consistently as prescribed by PCP. I will send new orders for Amovig. I will also try to get Nurtec covered. She has tried and failed multiple preventatives and abortive medications as listed above. We have continued the discussion regarding sleep apnea. I remain concerned that this could be contributing. I have given her information to review in AVS. She will follow up with me in 3 months to reevaluate.    No orders of the defined types were placed in this encounter.    Meds  ordered this encounter  Medications  . Erenumab-aooe (AIMOVIG) 140 MG/ML SOAJ    Sig: Inject 140 mg into the skin every 30 (thirty) days.    Dispense:  3 mL    Refill:  3    Order Specific Question:   Supervising Provider    Answer:   Melvenia Beam [id number]  . Rimegepant Sulfate (NURTEC) 75 MG TBDP    Sig: Take 75 mg by mouth daily as needed (take for abortive therapy of migraine, no more than 1 tablet in 24 hours or 10 per month).    Dispense:  8 tablet    Refill:  11    Order Specific Question:   Supervising Provider    Answer:   Melvenia Beam [id number]      I spent 25 minutes with the patient. 50% of this time was spent counseling and educating patient on plan of care and medications.    Debbora Presto, FNP-C 09/30/2019, 12:28 PM [facility] [address] [place], [place] [address] [phone]  Made any corrections needed, and agree with history, physical, neuro exam,assessment and plan as stated.  Sarina Ill, MD [facility]

## 2019-10-01 NOTE — Telephone Encounter (Signed)
[id number]. NURTEC TAB 75MG  ODT is approved through 01/01/2020. Your patient may now fill this prescription and it will be covered.  Letter faxed to pharmacy

## 2019-10-01 NOTE — Telephone Encounter (Signed)
A pa has been sent via CMM  (Key: [id number]) Nurtec 75MG  dispersible tablets  Please wait for [employer] 2017 NCPDP to return a determination.

## 2019-10-14 ENCOUNTER — Telehealth: Payer: Self-pay | Admitting: Family Medicine

## 2019-10-14 NOTE — Telephone Encounter (Signed)
Pt informed
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
## 2019-10-14 NOTE — Telephone Encounter (Signed)
I called pt and she stated that on 10-11-19 she got her aimovig out of refrigerator she fell asleep and it was left out overnight until 10-12-19.  She has called for replacement.  I have called back and gave verbal order to [facility] pharmacist at [facility] (who [employer] uses for replacement) for aimovig 140mg  /ml autoinjector.  They will send out to pt.

## 2019-10-19 NOTE — Telephone Encounter (Signed)
Signed and returned

## 2019-10-19 NOTE — Telephone Encounter (Signed)
[employer] form faxed, completed to AL/NP to sign.

## 2019-10-20 NOTE — Telephone Encounter (Signed)
Fax confirmation received. 

## 2019-10-22 ENCOUNTER — Other Ambulatory Visit: Payer: Self-pay | Admitting: Gastroenterology

## 2019-10-22 DIAGNOSIS — R1084 Generalized abdominal pain: Secondary | ICD-10-CM

## 2019-10-31 ENCOUNTER — Ambulatory Visit: Payer: Medicare Other

## 2019-12-15 ENCOUNTER — Telehealth: Payer: Self-pay | Admitting: Family Medicine

## 2019-12-15 NOTE — Telephone Encounter (Signed)
Called pt.  She has had migraine/headache since November.  Thought the aimovig (taken for 3 months) and nurtec would help.  This has not helped.  She states her head feels swollen, pressure in front of head.  Feels like the pressure reaches down to her eyes.  No vision change.  Level 6-7.  Relayed has had depacon in past that has helped and has had steroids (not sure if for migraine).  Is interested in deapacon, which she states has helped.  Please advise.

## 2019-12-15 NOTE — Telephone Encounter (Signed)
Ok to give Depacon infusion if availability. Otherwise, I can send taper pack.

## 2019-12-15 NOTE — Telephone Encounter (Signed)
I called pt after consulting with AL/NP Depacon 1000mg  IV .  Liane in intrafusion stated for pt to come on.  I called pt and she will come in will be here as soon as she can approx 1530.

## 2019-12-15 NOTE — Telephone Encounter (Signed)
Pt. states Erenumab-aooe (AIMOVIG) 140 MG/ML SOAJ & Rimegepant Sulfate (NURTEC) 75 MG TBDP has not helped headache & she has had it since early Nov. Please advise.

## 2019-12-16 NOTE — Telephone Encounter (Signed)
Pt called, the infusion I received is not working anymore. Would like a call from the nurse.

## 2019-12-16 NOTE — Telephone Encounter (Signed)
I called pt and relayed the plan for pt pre AL/NP.  Take nurtec for 3 days and drinking 50-60 oz water and benadryl at night as well if headache still issue then to call us back Monday.  See in office soon.  AL/NP out next week. Pt verbalized understanding,

## 2019-12-16 NOTE — Telephone Encounter (Signed)
I would like for her to take Nurtec daily, starting today, for a total of 3 doses. Make sure she is drinking 50-60 ounces of water a day. She can take a benadryl at night if needed. If headache is not resolving, She will need to be seen in the office.

## 2019-12-16 NOTE — Telephone Encounter (Signed)
I called pt and she stated that her depacon infusion did help some 4-5 from 6-7.  Came back in evening then today.  I awoke pt from sleeping it sounded like.

## 2019-12-22 ENCOUNTER — Telehealth: Payer: Self-pay | Admitting: Family Medicine

## 2019-12-22 NOTE — Telephone Encounter (Signed)
Spoke with patient. She was advised (last week) to take Nurtec for 3 days. Pt stated it did help but the headache returned after it wore off. She also took Benadryl and repeated the Nurtec yesterday and today. It only takes the edge off then she feels the pressure in her head coming back. She stated the Aimovig has been used consistently since October but she doesn't feel improvement yet. These are her typical migraines, nothing new. She is having trouble with her back, leg/sciatic pain, numbness for which is going to get injections tomorrow. I let pt know I would d/w Dr Jaynee Eagles and call her back tomorrow. She verbalized appreciation.

## 2019-12-22 NOTE — Telephone Encounter (Signed)
Pt. states her head is hurting. She states migraines are back. She states injection relieved a little bit, but never went away. Pt. states her ear, side of head & eye is hurting. Please advise.

## 2019-12-23 NOTE — Telephone Encounter (Signed)
Patient has tried multiple medications and most of the new stuff we have. We have tried; its not for lack of trying. Not sure what else we can be doing for her. Options include sending her to [facility] or [facility] to a headache center or Giving her a medrol dosepak, I can also include Lovena Le to see if her insurance will pay for botox if she had good results there. thanks

## 2019-12-23 NOTE — Telephone Encounter (Signed)
Called pt and she was at another doctor's office. Will try to call her back later this afternoon or tomorrow.

## 2019-12-24 ENCOUNTER — Other Ambulatory Visit: Payer: Self-pay | Admitting: Neurology

## 2019-12-24 MED ORDER — METHYLPREDNISOLONE 4 MG PO TBPK: ORAL_TABLET | ORAL | 1 refills | Status: DC
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
## 2019-12-24 NOTE — Telephone Encounter (Signed)
Can you review next week and tell us if she can possibly have botox? thanks

## 2019-12-28 NOTE — Telephone Encounter (Signed)
Patient has [employer] Medicare. They do not require PA unless we use specialty pharmacy. Let me know if you would like for patient to move forward with Botox and I can begin the process with specialty pharmacy.

## 2019-12-28 NOTE — Telephone Encounter (Signed)
Yes call patient and let her know and see if she would like to restart botox. thanks

## 2020-01-04 ENCOUNTER — Telehealth: Payer: Self-pay | Admitting: Family Medicine

## 2020-01-04 NOTE — Telephone Encounter (Signed)
Andrey Campanile, Amy saw patient last. She also has an appointment with Amy in 3 days (01/07/2020). Patient can discuss this with Amy.   Amy, if you need my help or in pt please come discuss with me thanks

## 2020-01-04 NOTE — Telephone Encounter (Signed)
Pt has called to report that as she was stepping down in lower doses to the  methylPREDNISolone (MEDROL DOSEPAK) 4 MG TBPK tablet her pain would increase, pt would like a call to discuss,

## 2020-01-04 NOTE — Telephone Encounter (Signed)
Called pt and she relayed that last tablet prednisone was yesterday migraine coming back level 5-6 frontal and L side pain, no n/v, has phono and photo sensitivity.  Is ok to proceed with BOTOX, Inusrance [employer].

## 2020-01-05 NOTE — Telephone Encounter (Signed)
Please let her know that I will be happy to address at her follow up. We can discuss Botox if she would like. Have her use abortive medications as prescribed and make sure she is drinking plenty of water.

## 2020-01-05 NOTE — Telephone Encounter (Signed)
I called pt and relayed that her appt 01-07-20 at 1000 with AL/NP will address issues Botox, use abortive meds and hydrate well.  Pt verbalized understanding.

## 2020-01-06 NOTE — Patient Instructions (Addendum)
Below is our plan:  We will continue Amovig for migraines. Use Nurtec as needed for abortive therapy. I am going to order a nerve conduction study to evaluate for causes of muscle cramps. I am also going to refer you to our sleep doctors to assess for sleep apnea. I will have staff call to help figure out coverage and potential costs of NCS/EMG and sleep study.   Please make sure you are staying well hydrated. I recommend 50-60 ounces daily. Well balanced diet and regular exercise encouraged.   Please continue follow up with care team as directed.   Follow up with me in 3 months   You may receive a survey regarding today's visit. I encourage you to leave honest feed back as I do use this information to improve patient care. Thank you for seeing me today!     Your physician has ordered a Nerve Conduction Study (NCV) and/or EMG testing.  This is a test to assess the status of your nerves and muscles. For the NCV portion of the test , sticky tabs will be placed on either your hands or feet.  Your nerves will be stimulated using small electrical charges and the speed of the impulse will be measured as it travels down the nerve. For the EMG portion of the test, a small pin will be placed below the surface of the skin to measure the electrical activity in certain muscles in your arms or legs. Eating/drinking prior to testing is ok.  Please DO NOT discontinue ANY medications prior to testing  Your appointment will be scheduled by a member of our team.  You will need to check in with the main reception desk. Your test could take approximately 30 minutes to 1 hour to complete depending on the extent of the test that has been ordered. TESTING ON LEGS/BACK/HIP/FOOT AREA: Bring/wear a pair of shorts.  **ABSOLUTELY NO LOTIONS, MOISTURIZERS, VASELINE, OILS OR CREAMS OF ANY KIND ON ANY BODY PART THE DAY OF THE TEST**  **DEODORANT IS OK TO WEAR**  Please make every effort to keep your scheduled  appointment time, as your physician needs the test results prior to your next appointment.  If you have to cancel or reschedule, please do so as soon as possible, so that another patient may use that testing time slot.  If you cancel your appointment, you may also need to reschedule your referring doctor's appointment until the test and results can be completed.  Please call [phone] and ask for Dr. [place] Blas assistant if you need to do so.  If you have any questions at any time please let us know.  We look forward to working with you!!   Muscle Cramps and Spasms Muscle cramps and spasms are when muscles tighten by themselves. They usually get better within minutes. Muscle cramps are painful. They are usually stronger and last longer than muscle spasms. Muscle spasms may or may not be painful. They can last a few seconds or much longer. Cramps and spasms can affect any muscle, but they occur most often in the calf muscles of the leg. They are usually not caused by a serious problem. In many cases, the cause is not known. Some common causes include:  Doing more physical work or exercise than your body is ready for.  Using the muscles too much (overuse) by repeating certain movements too many times.  Staying in a certain position for a long time.  Playing a sport or doing an activity without preparing properly.  Using bad form or technique while playing a sport or doing an activity.  Not having enough water in your body (dehydration).  Injury.  Side effects of some medicines.  Low levels of the salts and minerals in your blood (electrolytes), such as low potassium or calcium. Follow these instructions at home: Managing pain and stiffness      Massage, stretch, and relax the muscle. Do this for many minutes at a time.  If told, put heat on tight or tense muscles as often as told by your doctor. Use the heat source that your doctor recommends, such as a moist heat pack or a heating  pad. ? Place a towel between your skin and the heat source. ? Leave the heat on for 20-30 minutes. ? Remove the heat if your skin turns bright red. This is very important if you are not able to feel pain, heat, or cold. You may have a greater risk of getting burned.  If told, put ice on the affected area. This may help if you are sore or have pain after a cramp or spasm. ? Put ice in a plastic bag. ? Place a towel between your skin and the bag. ? Leave the ice on for 20 minutes, 2-3 times a day.  Try taking hot showers or baths to help relax tight muscles. Eating and drinking  Drink enough fluid to keep your pee (urine) pale yellow.  Eat a healthy diet to help ensure that your muscles work well. This should include: ? Fruits and vegetables. ? Lean protein. ? Whole grains. ? Low-fat or nonfat dairy products. General instructions  If you are having cramps often, avoid intense exercise for several days.  Take over-the-counter and prescription medicines only as told by your doctor.  Watch for any changes in your symptoms.  Keep all follow-up visits as told by your doctor. This is important. Contact a doctor if:  Your cramps or spasms get worse or happen more often.  Your cramps or spasms do not get better with time. Summary  Muscle cramps and spasms are when muscles tighten by themselves. They usually get better within minutes.  Cramps and spasms occur most often in the calf muscles of the leg.  Massage, stretch, and relax the muscle. This may help the cramp or spasm go away.  Drink enough fluid to keep your pee (urine) pale yellow. This information is not intended to replace advice given to you by your health care provider. Make sure you discuss any questions you have with your health care provider. Document Revised: 05/13/2017 Document Reviewed: 05/13/2017 Elsevier Patient Education  [place].    Sleep Apnea Sleep apnea affects breathing during sleep. It  causes breathing to stop for a short time or to become shallow. It can also increase the risk of:  Heart attack.  Stroke.  Being very overweight (obese).  Diabetes.  Heart failure.  Irregular heartbeat. The goal of treatment is to help you breathe normally again. What are the causes? There are three kinds of sleep apnea:  Obstructive sleep apnea. This is caused by a blocked or collapsed airway.  Central sleep apnea. This happens when the brain does not send the right signals to the muscles that control breathing.  Mixed sleep apnea. This is a combination of obstructive and central sleep apnea. The most common cause of this condition is a collapsed or blocked airway. This can happen if:  Your throat muscles are too relaxed.  Your tongue and tonsils  are too large.  You are overweight.  Your airway is too small. What increases the risk?  Being overweight.  Smoking.  Having a small airway.  Being older.  Being female.  Drinking alcohol.  Taking medicines to calm yourself (sedatives or tranquilizers).  Having family members with the condition. What are the signs or symptoms?  Trouble staying asleep.  Being sleepy or tired during the day.  Getting angry a lot.  Loud snoring.  Headaches in the morning.  Not being able to focus your mind (concentrate).  Forgetting things.  Less interest in sex.  Mood swings.  Personality changes.  Feelings of sadness (depression).  Waking up a lot during the night to pee (urinate).  Dry mouth.  Sore throat. How is this diagnosed?  Your medical history.  A physical exam.  A test that is done when you are sleeping (sleep study). The test is most often done in a sleep lab but may also be done at home. How is this treated?   Sleeping on your side.  Using a medicine to get rid of mucus in your nose (decongestant).  Avoiding the use of alcohol, medicines to help you relax, or certain pain medicines  (narcotics).  Losing weight, if needed.  Changing your diet.  Not smoking.  Using a machine to open your airway while you sleep, such as: ? An oral appliance. This is a mouthpiece that shifts your lower jaw forward. ? A CPAP device. This device blows air through a mask when you breathe out (exhale). ? An EPAP device. This has valves that you put in each nostril. ? A BPAP device. This device blows air through a mask when you breathe in (inhale) and breathe out.  Having surgery if other treatments do not work. It is important to get treatment for sleep apnea. Without treatment, it can lead to:  High blood pressure.  Coronary artery disease.  In men, not being able to have an erection (impotence).  Reduced thinking ability. Follow these instructions at home: Lifestyle  Make changes that your doctor recommends.  Eat a healthy diet.  Lose weight if needed.  Avoid alcohol, medicines to help you relax, and some pain medicines.  Do not use any products that contain nicotine or tobacco, such as cigarettes, e-cigarettes, and chewing tobacco. If you need help quitting, ask your doctor. General instructions  Take over-the-counter and prescription medicines only as told by your doctor.  If you were given a machine to use while you sleep, use it only as told by your doctor.  If you are having surgery, make sure to tell your doctor you have sleep apnea. You may need to bring your device with you.  Keep all follow-up visits as told by your doctor. This is important. Contact a doctor if:  The machine that you were given to use during sleep bothers you or does not seem to be working.  You do not get better.  You get worse. Get help right away if:  Your chest hurts.  You have trouble breathing in enough air.  You have an uncomfortable feeling in your back, arms, or stomach.  You have trouble talking.  One side of your body feels weak.  A part of your face is hanging  down. These symptoms may be an emergency. Do not wait to see if the symptoms will go away. Get medical help right away. Call your local emergency services (911 in the [place] Do not drive yourself to the hospital.  Summary  This condition affects breathing during sleep.  The most common cause is a collapsed or blocked airway.  The goal of treatment is to help you breathe normally while you sleep. This information is not intended to replace advice given to you by your health care provider. Make sure you discuss any questions you have with your health care provider. Document Revised: 10/04/2017 Document Reviewed: 08/13/2017 Elsevier Patient Education  [place].   Migraine Headache A migraine headache is a very strong throbbing pain on one side or both sides of your head. This type of headache can also cause other symptoms. It can last from 4 hours to 3 days. Talk with your doctor about what things may bring on (trigger) this condition. What are the causes? The exact cause of this condition is not known. This condition may be triggered or caused by:  Drinking alcohol.  Smoking.  Taking medicines, such as: ? Medicine used to treat chest pain (nitroglycerin). ? Birth control pills. ? Estrogen. ? Some blood pressure medicines.  Eating or drinking certain products.  Doing physical activity. Other things that may trigger a migraine headache include:  Having a menstrual period.  Pregnancy.  Hunger.  Stress.  Not getting enough sleep or getting too much sleep.  Weather changes.  Tiredness (fatigue). What increases the risk?  Being 61-55 years old.  Being female.  Having a family history of migraine headaches.  Being Caucasian.  Having depression or anxiety.  Being very overweight. What are the signs or symptoms?  A throbbing pain. This pain may: ? Happen in any area of the head, such as on one side or both sides. ? Make it hard to do daily  activities. ? Get worse with physical activity. ? Get worse around bright lights or loud noises.  Other symptoms may include: ? Feeling sick to your stomach (nauseous). ? Vomiting. ? Dizziness. ? Being sensitive to bright lights, loud noises, or smells.  Before you get a migraine headache, you may get warning signs (an aura). An aura may include: ? Seeing flashing lights or having blind spots. ? Seeing bright spots, halos, or zigzag lines. ? Having tunnel vision or blurred vision. ? Having numbness or a tingling feeling. ? Having trouble talking. ? Having weak muscles.  Some people have symptoms after a migraine headache (postdromal phase), such as: ? Tiredness. ? Trouble thinking (concentrating). How is this treated?  Taking medicines that: ? Relieve pain. ? Relieve the feeling of being sick to your stomach. ? Prevent migraine headaches.  Treatment may also include: ? Having acupuncture. ? Avoiding foods that bring on migraine headaches. ? Learning ways to control your body functions (biofeedback). ? Therapy to help you know and deal with negative thoughts (cognitive behavioral therapy). Follow these instructions at home: Medicines  Take over-the-counter and prescription medicines only as told by your doctor.  Ask your doctor if the medicine prescribed to you: ? Requires you to avoid driving or using heavy machinery. ? Can cause trouble pooping (constipation). You may need to take these steps to prevent or treat trouble pooping:  Drink enough fluid to keep your pee (urine) pale yellow.  Take over-the-counter or prescription medicines.  Eat foods that are high in fiber. These include beans, whole grains, and fresh fruits and vegetables.  Limit foods that are high in fat and sugar. These include fried or sweet foods. Lifestyle  Do not drink alcohol.  Do not use any products that contain nicotine or tobacco, such  as cigarettes, e-cigarettes, and chewing tobacco. If  you need help quitting, ask your doctor.  Get at least 8 hours of sleep every night.  Limit and deal with stress. General instructions      Keep a journal to find out what may bring on your migraine headaches. For example, write down: ? What you eat and drink. ? How much sleep you get. ? Any change in what you eat or drink. ? Any change in your medicines.  If you have a migraine headache: ? Avoid things that make your symptoms worse, such as bright lights. ? It may help to lie down in a dark, quiet room. ? Do not drive or use heavy machinery. ? Ask your doctor what activities are safe for you.  Keep all follow-up visits as told by your doctor. This is important. Contact a doctor if:  You get a migraine headache that is different or worse than others you have had.  You have more than 15 headache days in one month. Get help right away if:  Your migraine headache gets very bad.  Your migraine headache lasts longer than 72 hours.  You have a fever.  You have a stiff neck.  You have trouble seeing.  Your muscles feel weak or like you cannot control them.  You start to lose your balance a lot.  You start to have trouble walking.  You pass out (faint).  You have a seizure. Summary  A migraine headache is a very strong throbbing pain on one side or both sides of your head. These headaches can also cause other symptoms.  This condition may be treated with medicines and changes to your lifestyle.  Keep a journal to find out what may bring on your migraine headaches.  Contact a doctor if you get a migraine headache that is different or worse than others you have had.  Contact your doctor if you have more than 15 headache days in a month. This information is not intended to replace advice given to you by your health care provider. Make sure you discuss any questions you have with your health care provider. Document Revised: 04/11/2018 Document Reviewed:  01/30/2018 Elsevier Patient Education  [place].

## 2020-01-06 NOTE — Progress Notes (Addendum)
PATIENT: Savannah Taylor DOB: January 28, 1955  REASON FOR VISIT: follow up HISTORY FROM: patient  Chief Complaint  Patient presents with  . Follow-up    Pt states she is behind on her aimovig  injections, migraines have not improved      HISTORY OF PRESENT ILLNESS: Today 01/07/20  She returns today for migraine follow up. She was not taking Amovig consistently at last visit. She was reeducated on dosing and advised to take 1 injection every month. She took injections Oct, Nov and Dec. She does feel that [place] works to help reduce frequency and intensity of migraines for about 2 weeks. Week 3 and 4 she has more headaches. We also started Nurtec for abortive therapy. She reports that it works well to decrease intensity but does not completely relieve headache. She called mid December reporting an intractable migraine. Depacon infusion provided some benefit but headache returned shortly after infusion. We had her take Nurtec daily for three days and started steroid taper. She reports that she continues to have headache. Pain is across the front of her head (more so on left). It is described as a dull swollen/tight feeling.   We have discussed concerns of sleep apnea. She does not sleep well. She wakes up multiple times a night. She often wakes with headaches. She does snore. She never feels rested when she wakes. She has been hesitant to consider workup due to financial concerns.   We received a referral from PCP for chronic muscle cramps. Cramps are generalized and effect upper and lower extremities. PCP performed lab eval that did not reveal any significant etiology for symptoms. She has had tingling/pins needles sensation for over a year. Sensation comes and goes. She feels both legs are numb from tips of her toes to the knee on both sides but worse on the left. She tried gabapentin but was unable to tolerate due to brain fog. She takes 600mg  1-2 times weekly as needed. She reports that she still  is not recovered from lumbar fusion surgery in 01/2019. Robaxin prescribed by neurosurgery. She was referred to Dr Brien Few for pain management. She reports having a normal NCS/EMG many years ago. She reports that she cant hold her neck in certain positions or her neck with lock up. She feels that her hands lock up if she tries to hold anything for any period of time. She has spasms of her legs. No incontinence.   She is taking oxycodone as needed for back pain. She denies daily use. She has filled 30 tablets on 11/17/2019 and 12/14/2019. She also has alprazolam and clonazepam on her medication list. She is not certain how often she takes either of these. Per PDMP, she alternates filling alprazolam and clonazepam. She reports that she has ongoing depression and anxiety. She was taking Wellbutrin at last visit but reports that she has stopped this medicaiton sometime since last being seen. She is not sure when but didn't like how she felt on it.    History (copied from previous notes)  Savannah Taylor is a 65 y.o. female here today for follow up migraines. She continues to have regular headaches. She has not reached out to [place] to report headaches have continues. She has some sort of headache every day. She has at least 16-20 migraines every month. She reports taking Amovig every 3 months. Reports that she has only gotten 1 injection every three months. Amovig helps significantly after injection. She does not use abortive medication. She did  not feel Ubrelvy helped. She did not continue venlafaxine. She recently lost her mother. She continues to grieve her loss. She cries frequently. PCP has been treating depression with Wellbutrin but she hs not taken consistently. She is not sleeping well. She is always tired. She wakes frequently at night with headaches or to use the bathroom. She does snore. We have discussed concerns of sleep apnea in the past but she has been reluctant to consider workup. Her son has sleep  apnea.    HISTORY: (copied from my note on 07/30/2018)  Savannah Taylor is a 65 y.o. female here today for follow up for migraines and concerns of sleep apnea. She reports that headaches are unchanged. She continues to have frequent migraines but feels they are not as intense. She is caring for her mother with end stage dementia and cancer. She is taking venlafaxine 75mg  sporadically when she feels she needs it. She has taken Ubrelvy once but is insure if it helped.   She has not followed through with sleep apnea evaluation. She was uncertain if insurance would cover it. She is interested in continuing with evaluation.   HISTORY: (copied from my note on 03/27/2018)  Savannah Taylor a 65 y.o.femalefor follow up. She is taking topiramate 100mg  daily and Amovig 140mg  monthly for management of migraines.She reports that her headaches continue to occur daily. She has mild pain with most headaches but does report at least 10 migraines per month. Migraines are typically unilateral and associated with nausea, light and sound sensitivity. Last month she took an extra dose of Aimovig and reports that that had helped some. She states that the first 2 to 3 weeks after taking Aimovig she feels that she is well managed, however, the next week or so her migraines are pretty bad. She is taking Zomig for abortive therapybutreports it is no longer helping. She has been on multiple preventative medications including Topamax, verapamil, amitriptyline, Depakote, Inderal, Imitrex injections, Maxalt, Neurontin and Botox. She is also tried Effexor and Cymbalta. Headaches have not changed in nature. Last imaging reviewed was a CT of her head in 2014 that was unremarkable.  She reports about 10 years ago she had a work-up for sleep apnea. She is unsure of the results of her test but does mention that she was told if she ever reaches a deep stage of sleep. She does endorse snoring, frequent waking,  nocturia, dry mouth, occasional morning headaches, daytime sleepiness and fatigue. She is interested in an updated sleep study.   History (copied from [employer] note dated06/18/19)  Ms. Hinkley a 65 year old female with a history of migraine. She states that she had a severe headache for the last 2 to 3 weeks. She is on last day of her prednisone Dosepak. Reports prednisone has been beneficial. Reports that she still has a mild headache but not as severe as it was before. She states that her headaches typically occur on the left side. She always has phonophobia with her headaches. She remains on Topamax. She also uses Zomig as needed. She reports that Aimovigworked well for her migraines in the first 2 months. She states by the third month she began having a constant dull headache. She is currently taking the 70 mg injection. She returns today for evaluation.  HISTORY:Litisha S Taylor a 65 y.o.femalehere as a referral from Dr. Bonnita Nasuti migraines.Past medical history of chronic migraines, depression, hypertension, high cholesterol,B12 deficiency, chronic insomnia, chronic kidney disease, multilevel degenerative disease.She has been seeing Dr. Domingo Cocking  for nerve injections. Migraines since a teenager without any inciting event or head trauma.She has a family history of migraines,Her son and paternal aunt have migraines. She has been having [facility]so[facility],she went every month and then every 2 months(likely nerve block)but she also says that she received Botox which helped but insurance would not pay anymore. Now she just goes as needed.For nerve blocks.She hasheadaches15 days a month. 8-10 are migrainous.This is been ongoing for the last 6 months.Her migraines feele like her head isswelling on one side, but they can move around, one eye droops, feels tight, she can hear her heart beating in her head especially when laying down, more  pounding than pressure, she can't stand sound or noise. Dark rooms help. Being quiet helps. They can last for days straight. They can be severe with nausea and vomiting on average out of 10 can be 6-7/10. She has been to "so many doctors and so many places" and the doctors get frustrated because they can't help. She has been to headaches clinics.She was told she was faking in the past. She has tried everything per patient. No other associated symptoms or focal neurologic deficits or complaints. No aura. No medication overuse.  Tried: Topamax, verapamil, amitriptyline, depakote, inderal, she has had imitrex injections, maxalt, zomig. Neurontin, botox helped a lot but then her physician said insurance wouldn;t pay, effexor, cymbalta.   Reviewed notes, labs and imaging from outside physicians, which showed:  BUN 12 and creatinine 1.15 taken 04/05/2015. TSH 0.810 January 2017.  CT of the head 4/2014showed No acute intracranial abnormalities including mass lesion or mass effect, hydrocephalus, extra-axial fluid collection, midline shift, hemorrhage, or acute infarction, large ischemic events (personally reviewed images)    REVIEW OF SYSTEMS: Out of a complete 14 system review of symptoms, the patient complains only of the following symptoms, headaches, depression, insomnia, snoring, muscle cramps, chronic pain and all other reviewed systems are negative.   ALLERGIES: Allergies  Allergen Reactions  . Bystolic [Nebivolol Hcl] Palpitations and Other (See Comments)  . Morphine And Related Itching  . Naproxen Hives and Itching    HOME MEDICATIONS: Outpatient Medications Prior to Visit  Medication Sig Dispense Refill  . albuterol (PROAIR HFA) 108 (90 Base) MCG/ACT inhaler Inhale 2 puffs into the lungs every 4 (four) hours as needed for wheezing or shortness of breath. 1 Inhaler 1  . albuterol (PROVENTIL) (2.5 MG/3ML) 0.083% nebulizer solution Take 2.5 mg by nebulization every 6 (six) hours as  needed for wheezing or shortness of breath.    . ALPRAZolam (XANAX) 1 MG tablet Take 0.5 mg by mouth at bedtime as needed for anxiety.     Marland Kitchen amLODipine (NORVASC) 5 MG tablet Take 5 mg by mouth daily.    . Biotin 1 MG CAPS Take 1 mg by mouth daily.    Marland Kitchen buPROPion (WELLBUTRIN XL) 150 MG 24 hr tablet Take 150 mg by mouth daily.    . cholecalciferol (VITAMIN D3) 25 MCG (1000 UNIT) tablet Take 1,000 Units by mouth daily.    . clonazePAM (KLONOPIN) 0.5 MG tablet Take 0.5-1 mg by mouth at bedtime.    Eduard Roux (AIMOVIG) 140 MG/ML SOAJ Inject 140 mg into the skin every 30 (thirty) days. 3 mL 3  . ezetimibe (ZETIA) 10 MG tablet Take 10 mg by mouth daily.    Marland Kitchen gabapentin (NEURONTIN) 300 MG capsule Take 600 mg by mouth at bedtime as needed (pain).     . methocarbamol (ROBAXIN) 500 MG tablet  Take 1 tablet (500 mg total) by mouth every 6 (six) hours as needed for muscle spasms. 40 tablet 0  . mometasone-formoterol (DULERA) 100-5 MCG/ACT AERO Take 2 puffs first thing in am and then another 2 puffs about 12 hours later. (Patient taking differently: Inhale 2 puffs into the lungs 2 (two) times daily as needed for wheezing or shortness of breath.) 1 Inhaler 11  . oxyCODONE-acetaminophen (PERCOCET) 5-325 MG tablet Take 1 tablet by mouth every 4 (four) hours as needed for severe pain. 20 tablet 0  . Rimegepant Sulfate (NURTEC) 75 MG TBDP Take 75 mg by mouth daily as needed (take for abortive therapy of migraine, no more than 1 tablet in 24 hours or 10 per month). 8 tablet 11  . vitamin B-12 (CYANOCOBALAMIN) 1000 MCG tablet Take 1,000 mcg by mouth daily.    . methylPREDNISolone (MEDROL DOSEPAK) 4 MG TBPK tablet Take pills daily with food for 6 days. 21 tablet 1   No facility-administered medications prior to visit.    PAST MEDICAL HISTORY: Past Medical History:  Diagnosis Date  . Abdominal cramps   . Asthma   . Back problem    BAD DISK IN BACK  . DDD (degenerative disc disease), lumbosacral   .  Gastritis   . GERD (gastroesophageal reflux disease)   . H/O hiatal hernia   . Heart murmur    Years ago  . Hyperlipidemia   . Hypertension   . Knee problem   . Leg cramps   . Migraine    01/22/2019- last one > 1 month ago  . Seizure ([place])    1 many years ago with a migraine  . Shortness of breath   . Stroke [facility])    TIA  slight memory  issues TIA prior to 2010  . Ulcer    STOMACH & SORENESS & PAIN  . UTI (urinary tract infection) 07/02/2012    PAST SURGICAL HISTORY: Past Surgical History:  Procedure Laterality Date  . ABDOMINAL HYSTERECTOMY    . ARTHROGRAM KNEE Right    MCL tear  . COLONOSCOPY    . ENDOSCOPY  2020   hiatal hernia, gastritis, and some bacteria in stomach  . HEMORRHOID SURGERY      FAMILY HISTORY: Family History  Problem Relation Age of Onset  . Hypertension Mother   . Hyperlipidemia Mother   . Asthma Father   . Cancer Father   . Hypertension Brother   . Breast cancer Neg Hx     SOCIAL HISTORY: Social History   Socioeconomic History  . Marital status: Married    Spouse name: [name]  . Number of children: 3  . Years of education: Some college  . Highest education level: Not on file  Occupational History  . Occupation: works full time  Tobacco Use  . Smoking status: Never Smoker  . Smokeless tobacco: Never Used  Vaping Use  . Vaping Use: Never used  Substance and Sexual Activity  . Alcohol use: No  . Drug use: No  . Sexual activity: Not on file  Other Topics Concern  . Not on file  Social History Narrative   Lives with husband   Caffeine use: Coffee/tea/soda sometimes per patient   Social Determinants of Health   Financial Resource Strain: Not on file  Food Insecurity: Not on file  Transportation Needs: Not on file  Physical Activity: Not on file  Stress: Not on file  Social Connections: Not on file  Intimate Partner Violence: Not on file  PHYSICAL EXAM  Vitals:   01/07/20 0958  BP: 135/84  Pulse: 91  Weight: 206  lb (93.4 kg)  Height: 5\' 5"  (1.651 m)   Body mass index is 34.28 kg/m.  Generalized: Well developed, in no acute distress  Cardiology: normal rate and rhythm, no murmur noted Respiratory: clear to auscultation bilaterally  Neurological examination  Mentation: Alert oriented to time, place, history taking. Follows all commands speech and language fluent Cranial nerve II-XII: Pupils were equal round reactive to light. Extraocular movements were full, visual field were full  Motor: The motor testing reveals 5 over 5 strength of all 4 extremities. Good symmetric motor tone is noted throughout.  Sensation: increased sensitivity (tenderness) reported of left lateral calf with soft touch,  She was unable to feel pinprick testing of same area, normal sensation bilateral upper and right lower extremity.  Gait and station: Gait is normal.   DTR: decreased but symmetric in bilateral lower extremities, normal in bilateral uppers.   DIAGNOSTIC DATA (LABS, IMAGING, TESTING) - I reviewed patient records, labs, notes, testing and imaging myself where available.  No flowsheet data found.   Lab Results  Component Value Date   WBC 4.5 01/22/2019   HGB 13.1 01/22/2019   HCT 40.1 01/22/2019   MCV 92.8 01/22/2019   PLT 217 01/22/2019      Component Value Date/Time   NA 141 01/22/2019 1030   NA 142 08/31/2015 1313   K 3.9 01/22/2019 1030   CL 110 01/22/2019 1030   CO2 23 01/22/2019 1030   GLUCOSE 92 01/22/2019 1030   BUN 6 (L) 01/22/2019 1030   BUN 12 08/31/2015 1313   CREATININE 1.11 (H) 01/22/2019 1030   CALCIUM 9.1 01/22/2019 1030   PROT 7.1 08/31/2015 1313   ALBUMIN 4.4 08/31/2015 1313   AST 14 08/31/2015 1313   ALT 9 08/31/2015 1313   ALKPHOS 74 08/31/2015 1313   BILITOT 0.3 08/31/2015 1313   GFRNONAA 53 (L) 01/22/2019 1030   GFRAA >60 01/22/2019 1030   No results found for: CHOL, HDL, LDLCALC, LDLDIRECT, TRIG, CHOLHDL No results found for: HGBA1C No results found for:  VITAMINB12 No results found for: TSH     ASSESSMENT AND PLAN 65 y.o. year old female  has a past medical history of Abdominal cramps, Asthma, Back problem, DDD (degenerative disc disease), lumbosacral, Gastritis, GERD (gastroesophageal reflux disease), H/O hiatal hernia, Heart murmur, Hyperlipidemia, Hypertension, Knee problem, Leg cramps, Migraine, Seizure ([place]), Shortness of breath, Stroke ([place]), Ulcer, and UTI (urinary tract infection) (07/02/2012). here with     ICD-10-CM   1. Chronic migraine without aura, intractable, without status migrainosus  G43.719 Ambulatory referral to Sleep Studies  2. S/P lumbar fusion  Z98.1   3. Cramps, muscle, general  R25.2 NCV with EMG(electromyography)  4. Daytime sleepiness  R40.0 Ambulatory referral to Sleep Studies  5. Snoring  R06.83 Ambulatory referral to Sleep Studies  6. Morning headache  R51.9 Ambulatory referral to Sleep Studies     Mrs Tuchman continues to have fairly regular headaches, however, she has noted some improvement with consistent dosing of Aimovig.  She notes that Amovig is not as effective week 3 and 4.  Nurtec has helped reduce severity but does not always completely resolve headache.  We have discussed options of switching to a different CGRP versus trying Botox.  She is hesitant of either option.  We have continued discussion regarding concerns of sleep apnea.  She has frequent morning headaches, wakes frequently at night, she does  snore, has daytime sleepiness and a family history of sleep apnea.  Although she is very concerned about the financial burden associated with work-up, she does feel that workup needs to be completed.  I have placed a referral today to sleep medicine.  I will have our staff assist her with determining potential cost of sleep evaluation.  She also has concerns of generalized muscle cramps and spasms.  Neurologic exam is unremarkable with the exception of sensory changes to the left lateral calf.  I suspect  that she may have some lumbar radiculopathy status post fusion 1 year ago.  She is currently following with Dr. Murray Hodgkins for pain management.  We will order a nerve conduction study with EMG to evaluate concerns.  She wishes to continue Aimovig and Nurtec at this time.  She will focus on healthy lifestyle habits as discussed.  We will plan to follow-up in 3 months pending further work-up.  May adjust as needed.  She verbalizes understanding and agreement with this plan.   Orders Placed This Encounter  Procedures  . Ambulatory referral to Sleep Studies    Referral Priority:   Routine    Referral Type:   Consultation    Referral Reason:   Specialty Services Required    Number of Visits Requested:   1  . NCV with EMG(electromyography)    left upper and lower extremity for chronic generalized muscle cramps    Standing Status:   Future    Standing Expiration Date:   01/05/2021     No orders of the defined types were placed in this encounter.     I spent 30 minutes with the patient. 50% of this time was spent counseling and educating patient on plan of care and medications.     Shawnie Dapper, FNP-C 01/07/2020, 11:05 AM [facility] [address] [place], [place] [address] [phone]  Made any corrections needed, and agree with history, physical, neuro exam,assessment and plan as stated.     Naomie Dean, MD [facility]

## 2020-01-07 ENCOUNTER — Encounter: Payer: Self-pay | Admitting: Family Medicine

## 2020-01-07 ENCOUNTER — Ambulatory Visit: Payer: Medicare Other | Admitting: Family Medicine

## 2020-01-07 VITALS — BP 135/84 | HR 91 | Ht 65.0 in | Wt 206.0 lb

## 2020-01-07 DIAGNOSIS — G43719 Chronic migraine without aura, intractable, without status migrainosus: Secondary | ICD-10-CM | POA: Diagnosis not present

## 2020-01-07 DIAGNOSIS — Z981 Arthrodesis status: Secondary | ICD-10-CM | POA: Diagnosis not present

## 2020-01-07 DIAGNOSIS — R252 Cramp and spasm: Secondary | ICD-10-CM

## 2020-01-07 DIAGNOSIS — R0683 Snoring: Secondary | ICD-10-CM

## 2020-01-07 DIAGNOSIS — R519 Headache, unspecified: Secondary | ICD-10-CM | POA: Diagnosis not present

## 2020-01-07 DIAGNOSIS — R400 Somnolence: Secondary | ICD-10-CM | POA: Diagnosis not present

## 2020-01-10 ENCOUNTER — Encounter: Payer: Self-pay | Admitting: Family Medicine

## 2020-01-12 NOTE — Telephone Encounter (Signed)
I don't know anything about the cost of the test. Angie may know a self pay amount, but she wouldn't be able to tell her what her insurance will cover and all that. It is best for her to contact her insurance.

## 2020-01-12 NOTE — Telephone Encounter (Signed)
I think she has to call her insurance about the price of her botox and emg/ncs. I can add Savannah Taylor on to this so she can respond thanks

## 2020-01-12 NOTE — Telephone Encounter (Signed)
I called the patient. She is very hesitant about starting Botox due to the cost. She would like to know an estimate cost after insurance has been applied. I will check with her insurance to get that for her.   She also mentioned that no one has called her to give her a cost estimate of NCV/EMG. Elmyra Ricks, would you have any idea or should I tell the patient to call her insurance?

## 2020-01-18 NOTE — Telephone Encounter (Signed)
Insurance is not able to give me an estimate cost of Botox after insurance is applied. Because patient is adamant about wanting an estimate, Botox may not be the best route to take for her because of the uncertainty. Patient is not eligible for the savings program because she has a Medicare plan.

## 2020-01-18 NOTE — Telephone Encounter (Signed)
I called the patient and let her know. She is open to looking at other options for her migraines.

## 2020-01-18 NOTE — Telephone Encounter (Signed)
Noted, thanks Lovena Le let her know.

## 2020-01-20 ENCOUNTER — Other Ambulatory Visit: Payer: Self-pay | Admitting: Neurology

## 2020-01-20 MED ORDER — METHYLPREDNISOLONE 4 MG PO TBPK: ORAL_TABLET | Freq: Every day | ORAL | 1 refills | Status: DC

## 2020-01-20 NOTE — Telephone Encounter (Signed)
Spoke with patient about the steroid dose pack. Pt has taken this before and is familiar with it. She is aware of the instructions. Pt will f/u Monday 1/24 at her EMG. She verbalized appreciation for the call and her questions were answered.

## 2020-01-20 NOTE — Telephone Encounter (Signed)
Sure, done thanks

## 2020-01-21 DIAGNOSIS — N3281 Overactive bladder: Secondary | ICD-10-CM | POA: Diagnosis not present

## 2020-01-21 DIAGNOSIS — M961 Postlaminectomy syndrome, not elsewhere classified: Secondary | ICD-10-CM | POA: Diagnosis not present

## 2020-01-21 DIAGNOSIS — M461 Sacroiliitis, not elsewhere classified: Secondary | ICD-10-CM | POA: Diagnosis not present

## 2020-01-21 DIAGNOSIS — M4804 Spinal stenosis, thoracic region: Secondary | ICD-10-CM | POA: Diagnosis not present

## 2020-01-21 DIAGNOSIS — Z79899 Other long term (current) drug therapy: Secondary | ICD-10-CM | POA: Diagnosis not present

## 2020-01-25 ENCOUNTER — Encounter: Payer: Medicare Other | Admitting: Neurology

## 2020-02-05 DIAGNOSIS — M461 Sacroiliitis, not elsewhere classified: Secondary | ICD-10-CM | POA: Diagnosis not present

## 2020-02-11 ENCOUNTER — Encounter: Payer: Self-pay | Admitting: Neurology

## 2020-02-11 ENCOUNTER — Other Ambulatory Visit: Payer: Self-pay

## 2020-02-11 ENCOUNTER — Ambulatory Visit: Payer: Medicare Other | Admitting: Neurology

## 2020-02-11 VITALS — BP 126/86 | HR 84 | Ht 65.0 in | Wt 205.3 lb

## 2020-02-11 DIAGNOSIS — R0683 Snoring: Secondary | ICD-10-CM | POA: Diagnosis not present

## 2020-02-11 DIAGNOSIS — R519 Headache, unspecified: Secondary | ICD-10-CM

## 2020-02-11 DIAGNOSIS — Z820 Family history of epilepsy and other diseases of the nervous system: Secondary | ICD-10-CM

## 2020-02-11 DIAGNOSIS — R351 Nocturia: Secondary | ICD-10-CM | POA: Diagnosis not present

## 2020-02-11 DIAGNOSIS — E669 Obesity, unspecified: Secondary | ICD-10-CM

## 2020-02-11 NOTE — Progress Notes (Signed)
Subjective:    Patient ID: Savannah Taylor is a 65 y.o. female.  HPI     Star Age, MD, PhD [facility] [address].O. [place], [place] [address]  Dear [name],   I saw your patient, Savannah Taylor, upon your kind request, in my Sleep clinic today for initial consultation of her sleep disorder, in particular, concern for underlying obstructive sleep apnea.  The patient is unaccompanied today.  As you know, [name] is a 65 year old right-handed woman with an underlying medical history of asthma, degenerative disc disease, reflux disease, hiatal hernia, hyperlipidemia, hypertension, migraines, history of seizure, stroke, and obesity, who reports snoring and excessive daytime somnolence.  I reviewed your office note from 01/07/2020.  Her Epworth sleepiness score is 7/24, fatigue severity score is 52 out of 63.  She lives with her husband.  She has grown children.  She has nocturia about 3 times per average night and has woken up with a headache.  Snoring can be loud and disruptive.  She does not sleep well through the night.  She takes medication to help her sleep, sometimes Xanax, sometimes clonazepam, sometimes gabapentin and she also has a prescription for Robaxin.  She goes to bed between 9 and 10 and rise time is generally before 6.  She is a non-smoker and drinks caffeine in the form of coffee, 1 large cup per day on average.  She reports that her son has sleep apnea and has a machine.  She is on disability, no pets in the household, weight has been more or less stable.  She had a sleep study several years ago, over 5 years ago as far she remembers and at that time she did not have sleep apnea and CPAP therapy was not recommended.  Her Past Medical History Is Significant For: Past Medical History:  Diagnosis Date  . Abdominal cramps   . Asthma   . Back problem    BAD DISK IN BACK  . DDD (degenerative disc disease), lumbosacral   . Gastritis   .  GERD (gastroesophageal reflux disease)   . H/O hiatal hernia   . Heart murmur    Years ago  . Hyperlipidemia   . Hypertension   . Knee problem   . Leg cramps   . Migraine    01/22/2019- last one > 1 month ago  . Seizure ([facility])    1 many years ago with a migraine  . Shortness of breath   . Stroke [facility])    TIA  slight memory  issues TIA prior to 2010  . Ulcer    STOMACH & SORENESS & PAIN  . UTI (urinary tract infection) 07/02/2012    Her Past Surgical History Is Significant For: Past Surgical History:  Procedure Laterality Date  . ABDOMINAL HYSTERECTOMY    . ARTHROGRAM KNEE Right    MCL tear  . COLONOSCOPY    . ENDOSCOPY  2020   hiatal hernia, gastritis, and some bacteria in stomach  . HEMORRHOID SURGERY      Her Family History Is Significant For: Family History  Problem Relation Age of Onset  . Hypertension Mother   . Hyperlipidemia Mother   . Asthma Father   . Cancer Father   . Hypertension Brother   . Breast cancer Neg Hx     Her Social History Is Significant For: Social History   Socioeconomic History  . Marital status: Married    Spouse name: [name]  . Number  of children: 3  . Years of education: Some college  . Highest education level: Not on file  Occupational History  . Occupation: works full time  Tobacco Use  . Smoking status: Never Smoker  . Smokeless tobacco: Never Used  Vaping Use  . Vaping Use: Never used  Substance and Sexual Activity  . Alcohol use: No  . Drug use: No  . Sexual activity: Not on file  Other Topics Concern  . Not on file  Social History Narrative   Lives with husband   Caffeine use: Coffee/tea/soda sometimes per patient   Social Determinants of Health   Financial Resource Strain: Not on file  Food Insecurity: Not on file  Transportation Needs: Not on file  Physical Activity: Not on file  Stress: Not on file  Social Connections: Not on file    Her Allergies Are:  Allergies  Allergen Reactions  . Bystolic  [Nebivolol Hcl] Palpitations and Other (See Comments)  . Morphine And Related Itching  . Naproxen Hives and Itching  :   Her Current Medications Are:  Outpatient Encounter Medications as of 02/11/2020  Medication Sig  . albuterol (PROAIR HFA) 108 (90 Base) MCG/ACT inhaler Inhale 2 puffs into the lungs every 4 (four) hours as needed for wheezing or shortness of breath.  Marland Kitchen albuterol (PROVENTIL) (2.5 MG/3ML) 0.083% nebulizer solution Take 2.5 mg by nebulization every 6 (six) hours as needed for wheezing or shortness of breath.  . ALPRAZolam (XANAX) 1 MG tablet Take 0.5 mg by mouth at bedtime as needed for anxiety.   Marland Kitchen amLODipine (NORVASC) 5 MG tablet Take 5 mg by mouth daily.  . Biotin 1 MG CAPS Take 1 mg by mouth daily.  Marland Kitchen buPROPion (WELLBUTRIN XL) 150 MG 24 hr tablet Take 150 mg by mouth daily.  . cholecalciferol (VITAMIN D3) 25 MCG (1000 UNIT) tablet Take 1,000 Units by mouth daily.  . clonazePAM (KLONOPIN) 0.5 MG tablet Take 0.5-1 mg by mouth at bedtime.  Eduard Roux (AIMOVIG) 140 MG/ML SOAJ Inject 140 mg into the skin every 30 (thirty) days.  Marland Kitchen ezetimibe (ZETIA) 10 MG tablet Take 10 mg by mouth daily.  Marland Kitchen gabapentin (NEURONTIN) 300 MG capsule Take 600 mg by mouth at bedtime as needed (pain).   . methocarbamol (ROBAXIN) 500 MG tablet Take 1 tablet (500 mg total) by mouth every 6 (six) hours as needed for muscle spasms.  . methylPREDNISolone (MEDROL DOSEPAK) 4 MG TBPK tablet Take by mouth daily with breakfast. For 6 days. 6-5-4-3-2-1  . mometasone-formoterol (DULERA) 100-5 MCG/ACT AERO Take 2 puffs first thing in am and then another 2 puffs about 12 hours later. (Patient taking differently: Inhale 2 puffs into the lungs 2 (two) times daily as needed for wheezing or shortness of breath.)  . Rimegepant Sulfate (NURTEC) 75 MG TBDP Take 75 mg by mouth daily as needed (take for abortive therapy of migraine, no more than 1 tablet in 24 hours or 10 per month).  . vitamin B-12 (CYANOCOBALAMIN) 1000  MCG tablet Take 1,000 mcg by mouth daily.   No facility-administered encounter medications on file as of 02/11/2020.  :  Review of Systems:  Out of a complete 14 point review of systems, all are reviewed and negative with the exception of these symptoms as listed below: Review of Systems  Neurological:       Here for sleep consult.  Patient reports prior sleep study many years ago.  States obstructive sleep apnea was not found and CPAP was not warranted  at that time.  Patient reports she is here to reassess need for CPAP. Epworth Sleepiness Scale 0= would never doze 1= slight chance of dozing 2= moderate chance of dozing 3= high chance of dozing  Sitting and reading:1 Watching TV:2 Sitting inactive in a public place (ex. Theater or meeting):0 As a passenger in a car for an hour without a break:3 Lying down to rest in the afternoon:1 Sitting and talking to someone:0 Sitting quietly after lunch (no alcohol):0 In a car, while stopped in traffic:0 Total:7    Objective:  Neurological Exam  Physical Exam Physical Examination:   Vitals:   02/11/20 1332  BP: 126/86  Pulse: 84  SpO2: 95%    General Examination: The patient is a very pleasant 65 y.o. female in no acute distress. She appears well-developed and well-nourished and well groomed.   HEENT: Normocephalic, atraumatic, pupils are equal, round and reactive to light, extraocular tracking is good without limitation to gaze excursion or nystagmus noted. Hearing is grossly intact. Face is symmetric with normal facial animation. Speech is clear with no dysarthria noted. There is no hypophonia. There is no lip, neck/head, jaw or voice tremor. Neck is supple with full range of passive and active motion. There are no carotid bruits on auscultation. Oropharynx exam reveals: mild mouth dryness, adequate dental hygiene with full dentures on top.  Mallampati class III, moderate airway crowding secondary to small airway entry noted, smaller  tonsils, wider tongue.  Tongue protrudes centrally and palate elevates symmetrically.  Neck circumference of 15 5/8 inches.    Chest: Clear to auscultation without wheezing, rhonchi or crackles noted.  Heart: S1+S2+0, regular and normal without murmurs, rubs or gallops noted.   Abdomen: Soft, non-tender and non-distended with normal bowel sounds appreciated on auscultation.  Extremities: There is no pitting edema in the distal lower extremities bilaterally.   Skin: Warm and dry without trophic changes noted.   Musculoskeletal: exam reveals no obvious joint deformities, tenderness or joint swelling or erythema.   Neurologically:  Mental status: The patient is awake, alert and oriented in all 4 spheres. Her immediate and remote memory, attention, language skills and fund of knowledge are appropriate. There is no evidence of aphasia, agnosia, apraxia or anomia. Speech is clear with normal prosody and enunciation. Thought process is linear. Mood is normal and affect is normal.  Cranial nerves II - XII are as described above under HEENT exam.  Motor exam: Normal bulk, strength and tone is noted. There is no tremor, Romberg is negative. Fine motor skills and coordination: grossly intact.  Cerebellar testing: No dysmetria or intention tremor. There is no truncal or gait ataxia.  Sensory exam: intact to light touch in the upper and lower extremities.  Gait, station and balance: She stands easily. No veering to one side is noted. No leaning to one side is noted. Posture is age-appropriate and stance is narrow based. Gait shows normal stride length and normal pace. No problems turning are noted.   Assessment and Plan:  In summary, DESLYN CAVENAUGH is a very pleasant 65 y.o.-year old female with an underlying medical history of asthma, degenerative disc disease, reflux disease, hiatal hernia, hyperlipidemia, hypertension, migraines, history of seizure, stroke, and obesity, whose history and physical exam  are concerning for obstructive sleep apnea (OSA). I had a long chat with the patient about my findings and the diagnosis of OSA, its prognosis and treatment options. We talked about medical treatments, surgical interventions and non-pharmacological approaches. I explained in particular  the risks and ramifications of untreated moderate to severe OSA, especially with respect to developing cardiovascular disease down the Road, including congestive heart failure, difficult to treat hypertension, cardiac arrhythmias, or stroke. Even type 2 diabetes has, in part, been linked to untreated OSA. Symptoms of untreated OSA include daytime sleepiness, memory problems, mood irritability and mood disorder such as depression and anxiety, lack of energy, as well as recurrent headaches, especially morning headaches. We talked about trying to maintain a healthy lifestyle in general, as well as the importance of weight control. We also talked about the importance of good sleep hygiene. I recommended the following at this time: sleep study.   I explained the sleep test procedure to the patient and also outlined possible surgical and non-surgical treatment options of OSA, including the use of a custom-made dental device (which would require a referral to a specialist dentist or oral surgeon), upper airway surgical options, such as traditional UPPP or a novel less invasive surgical option in the form of Inspire hypoglossal nerve stimulation (which would involve a referral to an ENT surgeon). I also explained the CPAP treatment option to the patient, who indicated that she would be willing to try CPAP if the need arises. I explained the importance of being compliant with PAP treatment, not only for insurance purposes but primarily to improve Her symptoms, and for the patient's long term health benefit, including to reduce Her cardiovascular risks. I answered all her questions today and the patient was in agreement. I plan to see her  back after the sleep study is completed and encouraged her to call with any interim questions, concerns, problems or updates.   Thank you very much for allowing me to participate in the care of this nice patient. If I can be of any further assistance to you please do not hesitate to talk to me.  Sincerely,   Star Age, MD, PhD

## 2020-02-11 NOTE — Patient Instructions (Signed)

## 2020-02-29 ENCOUNTER — Ambulatory Visit: Payer: Medicare Other | Admitting: Neurology

## 2020-02-29 DIAGNOSIS — R519 Headache, unspecified: Secondary | ICD-10-CM

## 2020-02-29 DIAGNOSIS — R351 Nocturia: Secondary | ICD-10-CM

## 2020-02-29 DIAGNOSIS — E669 Obesity, unspecified: Secondary | ICD-10-CM

## 2020-02-29 DIAGNOSIS — R0683 Snoring: Secondary | ICD-10-CM

## 2020-02-29 DIAGNOSIS — Z820 Family history of epilepsy and other diseases of the nervous system: Secondary | ICD-10-CM

## 2020-03-03 NOTE — Progress Notes (Signed)
Error

## 2020-03-07 ENCOUNTER — Telehealth: Payer: Self-pay

## 2020-03-07 NOTE — Telephone Encounter (Signed)
LVM for pt to call me back to schedule another Home sleep study. Not enough recording time on 03/01/2020. MD has requested to repeat study.

## 2020-03-09 NOTE — Progress Notes (Signed)
Left upper and left lower extremities for cramps, numbness, neck pain, hand pain. See procedure note.

## 2020-03-10 ENCOUNTER — Ambulatory Visit: Payer: Medicare Other | Admitting: Neurology

## 2020-03-10 ENCOUNTER — Telehealth: Payer: Self-pay | Admitting: *Deleted

## 2020-03-10 ENCOUNTER — Ambulatory Visit (INDEPENDENT_AMBULATORY_CARE_PROVIDER_SITE_OTHER): Payer: Medicare Other | Admitting: Neurology

## 2020-03-10 DIAGNOSIS — R252 Cramp and spasm: Secondary | ICD-10-CM

## 2020-03-10 DIAGNOSIS — Z0289 Encounter for other administrative examinations: Secondary | ICD-10-CM

## 2020-03-10 NOTE — Telephone Encounter (Signed)
-----   Message from Debbora Presto, NP sent at 03/10/2020  4:32 PM EST -----   ----- Message ----- From: Melvenia Beam, MD Sent: 03/10/2020   3:12 PM EST To: Debbora Presto, NP

## 2020-03-10 NOTE — Progress Notes (Signed)
Full Name: Savannah Taylor Gender: Female MRN #: [id number] Date of Birth: September 09, 1955    Visit Date: 03/10/2020 08:16 Age: 65 Years Examining Physician: Sarina Ill, MD  Referring Physician: Maurice Small MD     History: Cramping in the feet, calfs,thighs,hands left >> right. Left upper and left lower extremities for cramps, numbness, neck pain, hand pain  Summary: EMG/NCS was performed on the left upper and lower extremities. The left ulnar sensory nerve showed no response however this is chronic and due to trauma and laceration. All remaining nerves (as indicated in the following tables) were within normal limits.  All muscles (as indicated in the following tables) were within normal limits.     Conclusions: The left ulnar sensory nerve showed no response however this is chronic and due to trauma and laceration. Otherwise this test is normal.   Sarina Ill M.D.  [facility] [address], [place], [place] [address] Tel: [phone] Fax: [phone]  Verbal informed consent was obtained from the patient, patient was informed of potential risk of procedure, including bruising, bleeding, hematoma formation, infection, muscle weakness, muscle pain, numbness, among others.        [place]    Nerve / Sites Muscle Latency Ref. Amplitude Ref. Rel Amp Segments Distance Velocity Ref. Area    ms ms mV mV %  cm m/s m/s mVms  L Median - APB     Wrist APB 3.9 ?4.4 9.3 ?4.0 100 Wrist - APB 7   35.9     Upper arm APB 8.3  8.9  95.6 Upper arm - Wrist 23 53 ?49 35.4  L Ulnar - ADM     Wrist ADM 3.3 ?3.3 8.5 ?6.0 100 Wrist - ADM 7   41.4     B.Elbow ADM 7.2  7.6  89.3 B.Elbow - Wrist 19 49 ?49 32.1     A.Elbow ADM 9.3  7.0  91.2 A.Elbow - B.Elbow 10 49 ?49 31.6  L Peroneal - EDB     Ankle EDB 5.1 ?6.5 6.0 ?2.0 100 Ankle - EDB 9   21.7     Fib head EDB 11.2  5.5  91.2 Fib head - Ankle 30 49 ?44 20.2     Pop fossa EDB 13.3  5.9  108 Pop fossa - Fib head 10 48 ?44  22.4         Pop fossa - Ankle      L Tibial - AH     Ankle AH 5.7 ?5.8 13.5 ?4.0 100 Ankle - AH 9   32.6     Pop fossa AH 14.6  12.3  90.5 Pop fossa - Ankle 39 44 ?41 35.3             SNC    Nerve / Sites Rec. Site Peak Lat Ref.  Amp Ref. Segments Distance Peak Diff Ref.    ms ms V V  cm ms ms  L Radial - Anatomical snuff box (Forearm)     Forearm Wrist 2.4 ?2.9 42 ?15 Forearm - Wrist 10    L Sural - Ankle (Calf)     Calf Ankle 3.7 ?4.4 16 ?6 Calf - Ankle 14    L Superficial peroneal - Ankle     Lat leg Ankle 3.8 ?4.4 14 ?6 Lat leg - Ankle 14    L Median, Ulnar - Transcarpal comparison     Median Palm Wrist 2.0 ?2.2 119 ?35 Median Palm -  Wrist 8       Ulnar Palm Wrist 3.3 ?2.2 6 ?12 Ulnar Palm - Wrist 8          Median Palm - Ulnar Palm  -1.2 ?0.4  L Median, Radial - Thumb comparison     Median Wrist Thumb 2.6  37  Median Wrist - Thumb 10       Radial Wrist Thumb 2.6  38  Radial Wrist - Thumb 10          Median Wrist - Radial Wrist  0.0 ?0.5  L Median - Orthodromic (Dig II, Mid palm)     Dig II Wrist 2.9 ?3.4 15 ?10 Dig II - Wrist 13    L Ulnar - Orthodromic, (Dig V, Mid palm)     Dig V Wrist NR ?3.1 NR ?5 Dig V - Wrist 95                   F  Wave    Nerve F Lat Ref.   ms ms  L Tibial - AH 54.8 ?56.0  L Ulnar - ADM 31.7 ?32.0         EMG Summary Table    Spontaneous MUAP Recruitment  Muscle IA Fib PSW Fasc Other Amp Dur. Poly Pattern  L. Deltoid Normal None None None _______ Normal Normal Normal Normal  L. Triceps brachii Normal None None None _______ Normal Normal Normal Normal  L. Pronator teres Normal None None None _______ Normal Normal Normal Normal  L. Biceps brachii Normal None None None _______ Normal Normal Normal Normal  L. Opponens pollicis Normal None None None _______ Normal Normal Normal Normal  L. Iliopsoas Normal None None None _______ Normal Normal Normal Normal  L. Vastus medialis Normal None None None _______ Normal Normal Normal Normal  L.  Tibialis anterior Normal None None None _______ Normal Normal Normal Normal  L. Gastrocnemius (Medial head) Normal None None None _______ Normal Normal Normal Normal  L. Extensor hallucis longus Normal None None None _______ Normal Normal Normal Normal  L. Abductor hallucis Normal None None None _______ Normal Normal Normal Normal

## 2020-03-10 NOTE — Progress Notes (Signed)
History: Cramping in the feet, calfs,thighs,hands.Left upper and left lower extremities for cramps, numbness, neck pain, hand pain. If she is holding something her hands will cramps and the fingers locked up. Ongoing for months. No inciting events, no new medications, no viruses. She has bad back, she had surgery not too long ago, she is seeing a pain specialist for injections and she has radicular pain from the low back mostly on the left leg where she has most of the cramping. Focused exam with normal upper extremity strength, lower strength limited by pain. Reflexes are symmetrical not pathologic. We dissced the normal emg.ncs. Discussed that the cramps in the legs are most likely due to her lyumbosacral radiculopathy but the hand would not be accounted for by the low back.  -  Suggested MRI cervical spine if the injections/pain treatments don;t help the lower extremities and the cramping in the hands continues.   I spent 10 minutes of face-to-face and non-face-to-face time with patient on the  1. Cramps, muscle, general    diagnosis.  This included previsit chart review, lab review, study review, order entry, electronic health record documentation, patient education on the different diagnostic and therapeutic options, counseling and coordination of care, risks and benefits of management, compliance, or risk factor reduction. This does not include time spent on emg/ncs.

## 2020-03-10 NOTE — Procedures (Signed)
Full Name: Savannah Taylor Gender: Female MRN #: [id number] Date of Birth: 1955-06-22    Visit Date: 03/10/2020 08:16 Age: 65 Years Examining Physician: Sarina Ill, MD  Referring Physician: Maurice Small MD     History: Cramping in the feet, calfs,thighs,hands left >> right. Left upper and left lower extremities for cramps, numbness, neck pain, hand pain  Summary: EMG/NCS was performed on the left upper and lower extremities. The left ulnar sensory nerve showed no response however this is chronic and due to trauma and laceration. All remaining nerves (as indicated in the following tables) were within normal limits.  All muscles (as indicated in the following tables) were within normal limits.     Conclusions: The left ulnar sensory nerve showed no response however this is chronic and due to trauma and laceration. Otherwise this test is normal.   Sarina Ill M.D.  [facility] [address], [place], [place] [address] Tel: [phone] Fax: [phone]  Verbal informed consent was obtained from the patient, patient was informed of potential risk of procedure, including bruising, bleeding, hematoma formation, infection, muscle weakness, muscle pain, numbness, among others.        [place]    Nerve / Sites Muscle Latency Ref. Amplitude Ref. Rel Amp Segments Distance Velocity Ref. Area    ms ms mV mV %  cm m/s m/s mVms  L Median - APB     Wrist APB 3.9 ?4.4 9.3 ?4.0 100 Wrist - APB 7   35.9     Upper arm APB 8.3  8.9  95.6 Upper arm - Wrist 23 53 ?49 35.4  L Ulnar - ADM     Wrist ADM 3.3 ?3.3 8.5 ?6.0 100 Wrist - ADM 7   41.4     B.Elbow ADM 7.2  7.6  89.3 B.Elbow - Wrist 19 49 ?49 32.1     A.Elbow ADM 9.3  7.0  91.2 A.Elbow - B.Elbow 10 49 ?49 31.6  L Peroneal - EDB     Ankle EDB 5.1 ?6.5 6.0 ?2.0 100 Ankle - EDB 9   21.7     Fib head EDB 11.2  5.5  91.2 Fib head - Ankle 30 49 ?44 20.2     Pop fossa EDB 13.3  5.9  108 Pop fossa - Fib head 10 48 ?44  22.4         Pop fossa - Ankle      L Tibial - AH     Ankle AH 5.7 ?5.8 13.5 ?4.0 100 Ankle - AH 9   32.6     Pop fossa AH 14.6  12.3  90.5 Pop fossa - Ankle 39 44 ?41 35.3             SNC    Nerve / Sites Rec. Site Peak Lat Ref.  Amp Ref. Segments Distance Peak Diff Ref.    ms ms V V  cm ms ms  L Radial - Anatomical snuff box (Forearm)     Forearm Wrist 2.4 ?2.9 42 ?15 Forearm - Wrist 10    L Sural - Ankle (Calf)     Calf Ankle 3.7 ?4.4 16 ?6 Calf - Ankle 14    L Superficial peroneal - Ankle     Lat leg Ankle 3.8 ?4.4 14 ?6 Lat leg - Ankle 14    L Median, Ulnar - Transcarpal comparison     Median Palm Wrist 2.0 ?2.2 119 ?35 Median Palm -  Wrist 8       Ulnar Palm Wrist 3.3 ?2.2 6 ?12 Ulnar Palm - Wrist 8          Median Palm - Ulnar Palm  -1.2 ?0.4  L Median, Radial - Thumb comparison     Median Wrist Thumb 2.6  37  Median Wrist - Thumb 10       Radial Wrist Thumb 2.6  38  Radial Wrist - Thumb 10          Median Wrist - Radial Wrist  0.0 ?0.5  L Median - Orthodromic (Dig II, Mid palm)     Dig II Wrist 2.9 ?3.4 15 ?10 Dig II - Wrist 13    L Ulnar - Orthodromic, (Dig V, Mid palm)     Dig V Wrist NR ?3.1 NR ?5 Dig V - Wrist 93                   F  Wave    Nerve F Lat Ref.   ms ms  L Tibial - AH 54.8 ?56.0  L Ulnar - ADM 31.7 ?32.0         EMG Summary Table    Spontaneous MUAP Recruitment  Muscle IA Fib PSW Fasc Other Amp Dur. Poly Pattern  L. Deltoid Normal None None None _______ Normal Normal Normal Normal  L. Triceps brachii Normal None None None _______ Normal Normal Normal Normal  L. Pronator teres Normal None None None _______ Normal Normal Normal Normal  L. Biceps brachii Normal None None None _______ Normal Normal Normal Normal  L. Opponens pollicis Normal None None None _______ Normal Normal Normal Normal

## 2020-03-10 NOTE — Progress Notes (Signed)
Relayed to pt via mychart that the [place]/EMg was normal.

## 2020-03-10 NOTE — Progress Notes (Signed)
Please let her know that her NCS/EMG was normal.

## 2020-03-15 DIAGNOSIS — M461 Sacroiliitis, not elsewhere classified: Secondary | ICD-10-CM | POA: Diagnosis not present

## 2020-03-28 ENCOUNTER — Ambulatory Visit (INDEPENDENT_AMBULATORY_CARE_PROVIDER_SITE_OTHER): Payer: Medicare Other | Admitting: Neurology

## 2020-03-28 DIAGNOSIS — G4733 Obstructive sleep apnea (adult) (pediatric): Secondary | ICD-10-CM | POA: Diagnosis not present

## 2020-03-28 DIAGNOSIS — R519 Headache, unspecified: Secondary | ICD-10-CM

## 2020-03-28 DIAGNOSIS — Z820 Family history of epilepsy and other diseases of the nervous system: Secondary | ICD-10-CM

## 2020-03-28 DIAGNOSIS — E669 Obesity, unspecified: Secondary | ICD-10-CM

## 2020-03-28 DIAGNOSIS — R351 Nocturia: Secondary | ICD-10-CM

## 2020-03-28 DIAGNOSIS — R0683 Snoring: Secondary | ICD-10-CM

## 2020-03-31 ENCOUNTER — Other Ambulatory Visit: Payer: Self-pay | Admitting: Neurology

## 2020-03-31 DIAGNOSIS — G4733 Obstructive sleep apnea (adult) (pediatric): Secondary | ICD-10-CM

## 2020-03-31 NOTE — Progress Notes (Signed)
Patient referred by Amy L., seen by me on 02/11/20, patient had a HST on 03/28/20 (repeat from 03/01/20).    Please call and notify the patient that the recent home sleep test showed obstructive sleep apnea in the moderate range. I recommend treatment in the form of autoPAP, which means, that we don't have to bring her in for a sleep study with CPAP, but will let her start using a so called autoPAP machine at home, which is a CPAP-like machine with self-adjusting pressures. We will send the order to a local DME company (of her choice, or as per insurance requirement). The DME representative will fit her with a mask, educate her on how to use the machine, how to put the mask on, etc. I have placed an order in the chart. Please send the order, talk to patient, send report to referring MD. We will need a FU in sleep clinic for 10 weeks post-PAP set up, please arrange that with me or one of our NPs. Also reinforce the need for compliance with treatment. Thanks,   Star Age, MD, PhD [facility])

## 2020-03-31 NOTE — Procedures (Signed)
   [facility] TEST (Watch PAT)  STUDY DATE: 03/28/20  DOB: 1955-05-02  MRN: [id number]  ORDERING CLINICIAN: Star Age, MD, PhD   REFERRING CLINICIAN: Debbora Presto, NP   CLINICAL INFORMATION/HISTORY: 65 year old woman with a history of asthma, degenerative disc disease, reflux disease, hiatal hernia, hyperlipidemia, hypertension, migraines, history of seizure, stroke, and obesity, who reports snoring and excessive daytime somnolence.  Epworth sleepiness score: 7/24.  BMI: 34.2 kg/m  Neck Circumference: 15 5/8"  FINDINGS:   Total Record Time (hours, min): 8 H 14 min  Total Sleep Time (hours, min):  5 H 59 min   Percent REM (%):    19.75 %   Calculated pAHI (per hour): 16.2       REM pAHI: 44.2    NREM pAHI: 12.8 Supine AHI: 15.3   Oxygen Saturation (%) Mean: 94  Minimum oxygen saturation (%):        85   O2 Saturation Range (%): 85-98  O2Saturation (minutes) <=88%: 0 min   Pulse Mean (bpm):    77  Pulse Range (59-104)   IMPRESSION: OSA (obstructive sleep apnea)   RECOMMENDATION:  This home sleep test demonstrates moderate obstructive sleep apnea with a total AHI of 16.2/hour and O2 nadir of 85%. Snoring was noted and appeared to be in the moderate range. Treatment with positive airway pressure is recommended. The patient will be advised to proceed with an autoPAP titration/trial at home for now. A full night titration study may be considered to optimize treatment settings, if needed down the road. Please note that untreated obstructive sleep apnea may carry additional perioperative morbidity. Patients with significant obstructive sleep apnea should receive perioperative PAP therapy and the surgeons and particularly the anesthesiologist should be informed of the diagnosis and the severity of the sleep disordered breathing. The patient should be cautioned not to drive, work at heights, or operate dangerous or heavy equipment when tired or sleepy. Review and  reiteration of good sleep hygiene measures should be pursued with any patient. Other causes of the patient's symptoms, including circadian rhythm disturbances, an underlying mood disorder, medication effect and/or an underlying medical problem cannot be ruled out based on this test. Clinical correlation is recommended. The patient and her referring provider will be notified of the test results. The patient will be seen in follow up in sleep clinic at [facility].  I certify that I have reviewed the raw data recording prior to the issuance of this report in accordance with the standards of the American Academy of Sleep Medicine (AASM).  INTERPRETING PHYSICIAN:    Star Age, MD, PhD  Board Certified in Neurology and Sleep Medicine  [facility] [address] [place], [place] [address] [phone]

## 2020-04-04 ENCOUNTER — Telehealth: Payer: Self-pay

## 2020-04-04 NOTE — Telephone Encounter (Signed)
I called pt. I advised pt that Dr. Rexene Alberts reviewed their sleep study results and found that pt has moderate osa. Dr. Rexene Alberts recommends that pt start on autopap for treatment. I reviewed PAP compliance expectations with the pt. Pt is agreeable to starting an auto-PAP. I advised pt that an order will be sent to a DME, [employer], and [employer] will call the pt within about one week after they file with the pt's insurance. [employer] will show the pt how to use the machine, fit for masks, and troubleshoot the auto-PAP if needed. A letter with all of this information in it will be mailed to the pt as a reminder. I verified with the pt that the address we have on file is correct. Pt verbalized understanding of results. Pt had no questions at this time but was encouraged to call back if questions arise. I have sent the order to [facility] and have received confirmation that they have received the order.  Pt understands we will needed to see her back within 31-90 days of starting her machine and of the shortage of CPAP machines right now.

## 2020-04-04 NOTE — Telephone Encounter (Signed)
-----   Message from Star Age, MD sent at 03/31/2020  6:00 PM EDT ----- Patient referred by Amy L., seen by me on 02/11/20, patient had a HST on 03/28/20 (repeat from 03/01/20).    Please call and notify the patient that the recent home sleep test showed obstructive sleep apnea in the moderate range. I recommend treatment in the form of autoPAP, which means, that we don't have to bring her in for a sleep study with CPAP, but will let her start using a so called autoPAP machine at home, which is a CPAP-like machine with self-adjusting pressures. We will send the order to a local DME company (of her choice, or as per insurance requirement). The DME representative will fit her with a mask, educate her on how to use the machine, how to put the mask on, etc. I have placed an order in the chart. Please send the order, talk to patient, send report to referring MD. We will need a FU in sleep clinic for 10 weeks post-PAP set up, please arrange that with me or one of our NPs. Also reinforce the need for compliance with treatment. Thanks,   Star Age, MD, PhD [facility])

## 2020-04-14 DIAGNOSIS — Z79899 Other long term (current) drug therapy: Secondary | ICD-10-CM | POA: Diagnosis not present

## 2020-04-14 DIAGNOSIS — M461 Sacroiliitis, not elsewhere classified: Secondary | ICD-10-CM | POA: Diagnosis not present

## 2020-04-14 DIAGNOSIS — M961 Postlaminectomy syndrome, not elsewhere classified: Secondary | ICD-10-CM | POA: Diagnosis not present

## 2020-04-21 DIAGNOSIS — J45909 Unspecified asthma, uncomplicated: Secondary | ICD-10-CM | POA: Diagnosis not present

## 2020-04-21 DIAGNOSIS — I10 Essential (primary) hypertension: Secondary | ICD-10-CM | POA: Diagnosis not present

## 2020-04-21 DIAGNOSIS — Z0000 Encounter for general adult medical examination without abnormal findings: Secondary | ICD-10-CM | POA: Diagnosis not present

## 2020-04-21 DIAGNOSIS — E785 Hyperlipidemia, unspecified: Secondary | ICD-10-CM | POA: Diagnosis not present

## 2020-04-21 DIAGNOSIS — E538 Deficiency of other specified B group vitamins: Secondary | ICD-10-CM | POA: Diagnosis not present

## 2020-04-27 ENCOUNTER — Ambulatory Visit: Payer: Medicare Other | Admitting: Family Medicine

## 2020-04-28 DIAGNOSIS — M961 Postlaminectomy syndrome, not elsewhere classified: Secondary | ICD-10-CM | POA: Diagnosis not present

## 2020-04-28 DIAGNOSIS — M545 Low back pain, unspecified: Secondary | ICD-10-CM | POA: Diagnosis not present

## 2020-04-28 DIAGNOSIS — I10 Essential (primary) hypertension: Secondary | ICD-10-CM | POA: Diagnosis not present

## 2020-06-28 ENCOUNTER — Other Ambulatory Visit: Payer: Self-pay

## 2020-06-28 ENCOUNTER — Emergency Department (HOSPITAL_COMMUNITY)
Admission: EM | Admit: 2020-06-28 | Discharge: 2020-06-29 | Disposition: A | Discharge status: Home / Self Care | Payer: Medicare Other | Attending: Emergency Medicine | Admitting: Emergency Medicine

## 2020-06-28 ENCOUNTER — Encounter (HOSPITAL_COMMUNITY): Payer: Self-pay | Admitting: Emergency Medicine

## 2020-06-28 ENCOUNTER — Emergency Department (HOSPITAL_COMMUNITY): Payer: Medicare Other

## 2020-06-28 DIAGNOSIS — S0990XA Unspecified injury of head, initial encounter: Secondary | ICD-10-CM | POA: Diagnosis not present

## 2020-06-28 DIAGNOSIS — S0993XA Unspecified injury of face, initial encounter: Secondary | ICD-10-CM | POA: Diagnosis not present

## 2020-06-28 DIAGNOSIS — I10 Essential (primary) hypertension: Secondary | ICD-10-CM | POA: Insufficient documentation

## 2020-06-28 DIAGNOSIS — Y92410 Unspecified street and highway as the place of occurrence of the external cause: Secondary | ICD-10-CM | POA: Diagnosis not present

## 2020-06-28 DIAGNOSIS — M542 Cervicalgia: Secondary | ICD-10-CM

## 2020-06-28 DIAGNOSIS — Z79899 Other long term (current) drug therapy: Secondary | ICD-10-CM | POA: Diagnosis not present

## 2020-06-28 DIAGNOSIS — R519 Headache, unspecified: Secondary | ICD-10-CM | POA: Insufficient documentation

## 2020-06-28 DIAGNOSIS — J45909 Unspecified asthma, uncomplicated: Secondary | ICD-10-CM | POA: Diagnosis not present

## 2020-06-28 DIAGNOSIS — Z041 Encounter for examination and observation following transport accident: Secondary | ICD-10-CM | POA: Diagnosis not present

## 2020-06-28 DIAGNOSIS — S46812A Strain of other muscles, fascia and tendons at shoulder and upper arm level, left arm, initial encounter: Secondary | ICD-10-CM | POA: Insufficient documentation

## 2020-06-28 DIAGNOSIS — S161XXA Strain of muscle, fascia and tendon at neck level, initial encounter: Secondary | ICD-10-CM | POA: Diagnosis not present

## 2020-06-28 DIAGNOSIS — S4992XA Unspecified injury of left shoulder and upper arm, initial encounter: Secondary | ICD-10-CM | POA: Diagnosis present

## 2020-06-28 MED ORDER — METHOCARBAMOL 500 MG PO TABS
500.0000 mg | ORAL_TABLET | Freq: Once | ORAL | Status: AC
  Administered 2020-06-28: 500 mg via ORAL
  Filled 2020-06-28: qty 1

## 2020-06-28 MED ORDER — ACETAMINOPHEN 500 MG PO TABS
1000.0000 mg | ORAL_TABLET | Freq: Once | ORAL | Status: AC
  Administered 2020-06-28: 1000 mg via ORAL
  Filled 2020-06-28: qty 2

## 2020-06-28 MED ORDER — METHOCARBAMOL 500 MG PO TABS: 500.0000 mg | ORAL_TABLET | Freq: Two times a day (BID) | ORAL | 0 refills | Status: DC

## 2020-06-28 NOTE — Discharge Instructions (Addendum)
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
## 2020-06-29 MED ORDER — METOCLOPRAMIDE HCL 5 MG/ML IJ SOLN
10.0000 mg | Freq: Once | INTRAMUSCULAR | Status: AC
  Administered 2020-06-29: 10 mg via INTRAVENOUS
  Filled 2020-06-29: qty 2

## 2020-06-29 MED ORDER — DIPHENHYDRAMINE HCL 25 MG PO CAPS
25.0000 mg | ORAL_CAPSULE | Freq: Once | ORAL | Status: AC
  Administered 2020-06-29: 25 mg via ORAL
  Filled 2020-06-29: qty 1

## 2020-06-29 MED ORDER — DEXAMETHASONE SODIUM PHOSPHATE 4 MG/ML IJ SOLN
4.0000 mg | Freq: Once | INTRAMUSCULAR | Status: AC
  Administered 2020-06-29: 4 mg via INTRAVENOUS
  Filled 2020-06-29: qty 1

## 2020-06-29 NOTE — ED Notes (Signed)
Patient verbalized understanding of discharge instructions. Opportunity for questions and answers.  

## 2020-06-30 ENCOUNTER — Telehealth: Payer: Self-pay | Admitting: Family Medicine

## 2020-06-30 MED ORDER — NURTEC 75 MG PO TBDP: 75.0000 mg | ORAL_TABLET | Freq: Every day | ORAL | 11 refills | Status: DC | PRN
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
## 2020-06-30 NOTE — Telephone Encounter (Signed)
Spoke to pt and relayed that nurtec (can take up to 3 days (one tablet daily) if she needs.  Rest and hydrate.  See pcp if needed as well.  She appreciated call back.

## 2020-06-30 NOTE — Telephone Encounter (Signed)
Pt called needing to speak to the RN regarding a headache that she is having that only broke when the emergency room gave her a cocktail. Pt would like to know if she can take her July Erenumab-aooe (AIMOVIG) 140 MG/ML SOAJ even thought we are still in June and she has already taken the June dosage. Please advise.

## 2020-07-06 NOTE — Telephone Encounter (Signed)
Pt has called back to report that she is still having the headaches and she'd like a call back to discuss
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
## 2020-07-07 ENCOUNTER — Other Ambulatory Visit: Payer: Self-pay

## 2020-07-07 ENCOUNTER — Emergency Department (HOSPITAL_BASED_OUTPATIENT_CLINIC_OR_DEPARTMENT_OTHER): Payer: Medicare Other

## 2020-07-07 ENCOUNTER — Emergency Department (HOSPITAL_BASED_OUTPATIENT_CLINIC_OR_DEPARTMENT_OTHER)
Admission: EM | Admit: 2020-07-07 | Discharge: 2020-07-07 | Disposition: A | Discharge status: Home / Self Care | Payer: Medicare Other | Attending: Emergency Medicine | Admitting: Emergency Medicine

## 2020-07-07 ENCOUNTER — Encounter (HOSPITAL_BASED_OUTPATIENT_CLINIC_OR_DEPARTMENT_OTHER): Payer: Self-pay | Admitting: Emergency Medicine

## 2020-07-07 DIAGNOSIS — Z79899 Other long term (current) drug therapy: Secondary | ICD-10-CM | POA: Diagnosis not present

## 2020-07-07 DIAGNOSIS — J45909 Unspecified asthma, uncomplicated: Secondary | ICD-10-CM | POA: Insufficient documentation

## 2020-07-07 DIAGNOSIS — R42 Dizziness and giddiness: Secondary | ICD-10-CM | POA: Diagnosis not present

## 2020-07-07 DIAGNOSIS — R519 Headache, unspecified: Secondary | ICD-10-CM | POA: Diagnosis not present

## 2020-07-07 DIAGNOSIS — I10 Essential (primary) hypertension: Secondary | ICD-10-CM | POA: Insufficient documentation

## 2020-07-07 DIAGNOSIS — R410 Disorientation, unspecified: Secondary | ICD-10-CM | POA: Diagnosis not present

## 2020-07-07 LAB — CBC WITH DIFFERENTIAL/PLATELET
Abs Immature Granulocytes: 0.01 10*3/uL (ref 0.00–0.07)
Basophils Absolute: 0 10*3/uL (ref 0.0–0.1)
Basophils Relative: 1 %
Eosinophils Absolute: 0.2 10*3/uL (ref 0.0–0.5)
Eosinophils Relative: 3 %
HCT: 40.6 % (ref 36.0–46.0)
Hemoglobin: 13.5 g/dL (ref 12.0–15.0)
Immature Granulocytes: 0 %
Lymphocytes Relative: 36 %
Lymphs Abs: 2.5 10*3/uL (ref 0.7–4.0)
MCH: 29.9 pg (ref 26.0–34.0)
MCHC: 33.3 g/dL (ref 30.0–36.0)
MCV: 89.8 fL (ref 80.0–100.0)
Monocytes Absolute: 0.6 10*3/uL (ref 0.1–1.0)
Monocytes Relative: 8 %
Neutro Abs: 3.6 10*3/uL (ref 1.7–7.7)
Neutrophils Relative %: 52 %
Platelets: 333 10*3/uL (ref 150–400)
RBC: 4.52 MIL/uL (ref 3.87–5.11)
RDW: 14.5 % (ref 11.5–15.5)
WBC: 6.9 10*3/uL (ref 4.0–10.5)
nRBC: 0 % (ref 0.0–0.2)

## 2020-07-07 LAB — BASIC METABOLIC PANEL
Anion gap: 9 (ref 5–15)
BUN: 11 mg/dL (ref 8–23)
CO2: 27 mmol/L (ref 22–32)
Calcium: 9.9 mg/dL (ref 8.9–10.3)
Chloride: 104 mmol/L (ref 98–111)
Creatinine, Ser: 0.95 mg/dL (ref 0.44–1.00)
GFR, Estimated: >60 mL/min (ref >60)
Glucose, Bld: 100 mg/dL — ABNORMAL HIGH (ref 70–99)
Potassium: 3.7 mmol/L (ref 3.5–5.1)
Sodium: 140 mmol/L (ref 135–145)

## 2020-07-07 LAB — URINALYSIS, ROUTINE W REFLEX MICROSCOPIC
Bilirubin Urine: NEGATIVE
Glucose, UA: NEGATIVE mg/dL
Hgb urine dipstick: NEGATIVE
Ketones, ur: NEGATIVE mg/dL
Leukocytes,Ua: NEGATIVE
Nitrite: NEGATIVE
Protein, ur: NEGATIVE mg/dL
Specific Gravity, Urine: 1.026 (ref 1.005–1.030)
pH: 5.5 (ref 5.0–8.0)

## 2020-07-07 MED ORDER — DIPHENHYDRAMINE HCL 50 MG/ML IJ SOLN
12.5000 mg | Freq: Once | INTRAMUSCULAR | Status: AC
  Administered 2020-07-07: 12.5 mg via INTRAVENOUS
  Filled 2020-07-07: qty 1

## 2020-07-07 MED ORDER — SODIUM CHLORIDE 0.9 % IV BOLUS
500.0000 mL | Freq: Once | INTRAVENOUS | Status: AC
  Administered 2020-07-07: 500 mL via INTRAVENOUS

## 2020-07-07 MED ORDER — DEXAMETHASONE SODIUM PHOSPHATE 10 MG/ML IJ SOLN
10.0000 mg | Freq: Once | INTRAMUSCULAR | Status: AC
  Administered 2020-07-07: 10 mg via INTRAVENOUS
  Filled 2020-07-07: qty 1

## 2020-07-07 MED ORDER — METOCLOPRAMIDE HCL 5 MG/ML IJ SOLN
5.0000 mg | Freq: Once | INTRAMUSCULAR | Status: AC
  Administered 2020-07-07: 5 mg via INTRAVENOUS
  Filled 2020-07-07: qty 2
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
## 2020-07-07 NOTE — ED Notes (Signed)
States headache is better 

## 2020-07-07 NOTE — Discharge Instructions (Addendum)
You have been seen and discharged from the emergency department.  Your blood work and urine was normal.  Head CT showed no acute change.  You were given a migraine cocktail.  Follow-up with your primary provider and neurology for reevaluation and further care. Take home medications as prescribed. If you have any worsening symptoms or further concerns for your health please return to an emergency department for further evaluation.
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
## 2020-07-14 DIAGNOSIS — M62838 Other muscle spasm: Secondary | ICD-10-CM | POA: Diagnosis not present

## 2020-08-16 ENCOUNTER — Ambulatory Visit: Payer: Self-pay | Admitting: Neurology

## 2020-08-18 ENCOUNTER — Other Ambulatory Visit: Payer: Self-pay

## 2020-08-18 MED ORDER — AIMOVIG 140 MG/ML ~~LOC~~ SOAJ: 140.0000 mg | SUBCUTANEOUS | 3 refills | Status: DC

## 2020-08-22 DIAGNOSIS — G4733 Obstructive sleep apnea (adult) (pediatric): Secondary | ICD-10-CM | POA: Diagnosis not present

## 2020-08-23 ENCOUNTER — Telehealth: Payer: Self-pay | Admitting: *Deleted

## 2020-08-23 NOTE — Telephone Encounter (Signed)
We received a fax from [facility].  Patient has been set up on a [place] starting 08/22/2020.  She will need a follow-up appointment per insurance between 09/22/2020 and 11/20/2020.

## 2020-09-01 ENCOUNTER — Telehealth: Payer: Self-pay | Admitting: Neurology

## 2020-09-01 NOTE — Telephone Encounter (Signed)
Pt called, oxyen level pressure too high, struggle to breath. Can Dr. Rexene Alberts have a prescription written to start lower level , gradually increase until get comfortable with the CPAP machine. Would like a call from the nurse.

## 2020-09-01 NOTE — Telephone Encounter (Signed)
I called pt and she states that her headpiece/mask is too tight causing her headache and causing her to pant, (forcing her to take more breaths).  She has appt tomorrow to to get new (larger head strap/mask).  She will try this and she will let me know if things not resolved and if she is doing better will continue.  She appreciated call back.

## 2020-09-02 ENCOUNTER — Telehealth: Payer: Self-pay | Admitting: Neurology

## 2020-09-02 NOTE — Telephone Encounter (Signed)
Pt called requesting refill for Erenumab-aooe (AIMOVIG) 140 MG/ML SOAJ. [facility] [phone]. Pt also has new insurance  [employer] MED[employer]ARE VALUE [employer]US MEM ID: [id number] GRP#: [id number] RX#: [id number]

## 2020-09-06 MED ORDER — AIMOVIG 140 MG/ML ~~LOC~~ SOAJ: 140.0000 mg | SUBCUTANEOUS | 3 refills | Status: DC

## 2020-09-06 NOTE — Telephone Encounter (Signed)
Refill sent.

## 2020-09-22 DIAGNOSIS — G4733 Obstructive sleep apnea (adult) (pediatric): Secondary | ICD-10-CM | POA: Diagnosis not present

## 2020-10-04 ENCOUNTER — Telehealth: Payer: Self-pay | Admitting: *Deleted

## 2020-10-04 NOTE — Telephone Encounter (Signed)
PA approved via [employer]  09/01/2020 - 12/31/2020.  Member ID Number: [id number]

## 2020-10-04 NOTE — Telephone Encounter (Signed)
Submitted PA Aimovig on CMM. Key: [id number]. Waiting on determination from [facility].

## 2020-10-17 ENCOUNTER — Telehealth: Payer: Self-pay | Admitting: Family Medicine

## 2020-10-17 NOTE — Telephone Encounter (Signed)
Pt called states she is not able to use her mask for her CPAP machine it is breaking out her face. Wanting to know what she should do. Pt requesting a  call back.

## 2020-10-22 DIAGNOSIS — G4733 Obstructive sleep apnea (adult) (pediatric): Secondary | ICD-10-CM | POA: Diagnosis not present

## 2020-10-31 ENCOUNTER — Telehealth: Payer: Self-pay | Admitting: Neurology

## 2020-10-31 NOTE — Telephone Encounter (Signed)
Pt called, have not used CPAP machine because had rash on my face. . Have an appt Thursday with [facility] to be fitted with a mask. Do I need to keep this appt or can I just bring sims card to the office so you can download.Would like a call from the nurse.

## 2020-11-01 NOTE — Telephone Encounter (Signed)
Noted and agree, thank you!

## 2020-11-01 NOTE — Telephone Encounter (Signed)
Spoke with patient.  She was set up on a [place] on August 22, 2020.  She used it for 23 days until she stopped using it on September 29 due to skin irritation on her face.  She has an appointment for a mask refit and hopes to get a nasal pillow this Thursday 11/3.  She is aware that insurance will require at least 30 days of use.  Recommend patient cancel 11/3 appt with Dr Rexene Alberts, increase her usage and reschedule.  Patient will call our office as soon as she has her appointment with [facility] on Thursday. Her end of window for compliance visit will be 11/20/20.

## 2020-11-03 ENCOUNTER — Ambulatory Visit: Payer: Medicare Other | Admitting: Neurology

## 2020-11-03 NOTE — Telephone Encounter (Signed)
Pt called, have picked up my mask from [place]. Will start using tonight. Would like a call from the nurse.  Attempted to reschedule Initial CPAP, could not find anything available within compliance date.

## 2020-11-03 NOTE — Telephone Encounter (Signed)
Will watch for a sooner appt.

## 2020-11-07 NOTE — Telephone Encounter (Signed)
Spoke with the patient and got her scheduled for a visit with [facility] NP on Friday, November 18 at 11 AM.  We will plan to do this virtually as I can see her data on the ic[employer] connect portal.  Patient just received a new fullface mask at the end of last week.  Reviewed the different material as the other one was breaking her face but it is still a fullface.  Patient states she is waking up multiple times overnight and is not tolerating it well.  I encouraged her to give [facility] a call to see if she could try a nasal mask or another type of mask that she may tolerate better.  Patient also stated she has a new insurance card, [employer].  She will provide this information to [facility] and see if it is possible to get a new mask.  She will contact her office with an update when she hears back.

## 2020-11-08 ENCOUNTER — Other Ambulatory Visit: Payer: Self-pay | Admitting: Family Medicine

## 2020-11-08 DIAGNOSIS — Z1231 Encounter for screening mammogram for malignant neoplasm of breast: Secondary | ICD-10-CM

## 2020-11-09 DIAGNOSIS — G4733 Obstructive sleep apnea (adult) (pediatric): Secondary | ICD-10-CM | POA: Diagnosis not present

## 2020-11-14 DIAGNOSIS — R69 Illness, unspecified: Secondary | ICD-10-CM | POA: Diagnosis not present

## 2020-11-14 DIAGNOSIS — E669 Obesity, unspecified: Secondary | ICD-10-CM | POA: Diagnosis not present

## 2020-11-14 DIAGNOSIS — G4700 Insomnia, unspecified: Secondary | ICD-10-CM | POA: Diagnosis not present

## 2020-11-14 DIAGNOSIS — M549 Dorsalgia, unspecified: Secondary | ICD-10-CM | POA: Diagnosis not present

## 2020-11-18 ENCOUNTER — Encounter: Payer: Self-pay | Admitting: Adult Health

## 2020-11-18 ENCOUNTER — Ambulatory Visit: Payer: Medicare HMO | Admitting: Adult Health

## 2020-11-18 ENCOUNTER — Other Ambulatory Visit: Payer: Self-pay

## 2020-11-18 VITALS — BP 133/96 | HR 81 | Ht 66.0 in | Wt 218.4 lb

## 2020-11-18 DIAGNOSIS — Z9989 Dependence on other enabling machines and devices: Secondary | ICD-10-CM | POA: Diagnosis not present

## 2020-11-18 DIAGNOSIS — G4733 Obstructive sleep apnea (adult) (pediatric): Secondary | ICD-10-CM | POA: Diagnosis not present

## 2020-11-18 NOTE — Progress Notes (Signed)
PATIENT: Savannah Taylor DOB: Jun 08, 1955  REASON FOR VISIT: follow up HISTORY FROM: patient   HISTORY OF PRESENT ILLNESS: Today 11/18/20:  Savannah Taylor is a 65 year old female with a history of obstructive sleep apnea on CPAP.  She returns today for follow-up.  Her download indicates that she used her machine 35 out of the last 95 days for compliance of 36%.  She use her machine greater than 4 hours only 16 days for compliance of 60%.  Her average usage is 3 hours and 59 minutes.  Her residual AHI is 2.9 on 6 to 12 cm of water.  The patient states that she feels that she is not getting enough air.  She recently changed to the nasal pillows and likes that it is better.  She returns today for an evaluation.  HISTORY Savannah Taylor is a 65 year old right-handed woman with an underlying medical history of asthma, degenerative disc disease, reflux disease, hiatal hernia, hyperlipidemia, hypertension, migraines, history of seizure, stroke, and obesity, who reports snoring and excessive daytime somnolence.  I reviewed your office note from 01/07/2020.  Her Epworth sleepiness score is 7/24, fatigue severity score is 52 out of 63.  She lives with her husband.  She has grown children.  She has nocturia about 3 times per average night and has woken up with a headache.  Snoring can be loud and disruptive.  She does not sleep well through the night.  She takes medication to help her sleep, sometimes Xanax, sometimes clonazepam, sometimes gabapentin and she also has a prescription for Robaxin.  She goes to bed between 9 and 10 and rise time is generally before 6.  She is a non-smoker and drinks caffeine in the form of coffee, 1 large cup per day on average.  She reports that her son has sleep apnea and has a machine.  She is on disability, no pets in the household, weight has been more or less stable.  She had a sleep study several years ago, over 5 years ago as far she remembers and at that time she did not have sleep  apnea and CPAP therapy was not recommended.  REVIEW OF SYSTEMS: Out of a complete 14 system review of symptoms, the patient complains only of the following symptoms, and all other reviewed systems are negative.  FSS ESS  ALLERGIES: Allergies  Allergen Reactions   Bystolic [Nebivolol Hcl] Palpitations and Other (See Comments)   Morphine And Related Itching   Naproxen Hives and Itching    HOME MEDICATIONS: Outpatient Medications Prior to Visit  Medication Sig Dispense Refill   albuterol (PROVENTIL) (2.5 MG/3ML) 0.083% nebulizer solution Take 2.5 mg by nebulization every 6 (six) hours as needed for wheezing or shortness of breath.     ALPRAZolam (XANAX) 1 MG tablet Take 0.5 mg by mouth at bedtime as needed for anxiety.      Biotin 1 MG CAPS Take 1 mg by mouth daily.     Erenumab-aooe (AIMOVIG) 140 MG/ML SOAJ Inject 140 mg into the skin every 30 (thirty) days. 3 mL 3   ezetimibe (ZETIA) 10 MG tablet Take 10 mg by mouth daily.     mometasone-formoterol (DULERA) 100-5 MCG/ACT AERO Take 2 puffs first thing in am and then another 2 puffs about 12 hours later. (Patient taking differently: Inhale 2 puffs into the lungs 2 (two) times daily as needed for wheezing or shortness of breath.) 1 Inhaler 11   Rimegepant Sulfate (NURTEC) 75 MG TBDP Take 75 mg by  mouth daily as needed (take for abortive therapy of migraine, no more than 1 tablet in 24 hours or 10 per month). 8 tablet 11   vitamin B-12 (CYANOCOBALAMIN) 1000 MCG tablet Take 1,000 mcg by mouth daily.     albuterol (PROAIR HFA) 108 (90 Base) MCG/ACT inhaler Inhale 2 puffs into the lungs every 4 (four) hours as needed for wheezing or shortness of breath. (Patient not taking: Reported on 11/18/2020) 1 Inhaler 1   amLODipine (NORVASC) 5 MG tablet Take 5 mg by mouth daily. (Patient not taking: Reported on 11/18/2020)     buPROPion (WELLBUTRIN XL) 150 MG 24 hr tablet Take 150 mg by mouth daily. (Patient not taking: Reported on 11/18/2020)      cholecalciferol (VITAMIN D3) 25 MCG (1000 UNIT) tablet Take 1,000 Units by mouth daily. (Patient not taking: Reported on 11/18/2020)     clonazePAM (KLONOPIN) 0.5 MG tablet Take 0.5-1 mg by mouth at bedtime. (Patient not taking: Reported on 11/18/2020)     gabapentin (NEURONTIN) 300 MG capsule Take 600 mg by mouth at bedtime as needed (pain).  (Patient not taking: Reported on 11/18/2020)     methocarbamol (ROBAXIN) 500 MG tablet Take 1 tablet (500 mg total) by mouth 2 (two) times daily. (Patient not taking: Reported on 11/18/2020) 20 tablet 0   methylPREDNISolone (MEDROL DOSEPAK) 4 MG TBPK tablet Take by mouth daily with breakfast. For 6 days. 6-5-4-3-2-1 (Patient not taking: Reported on 11/18/2020) 21 tablet 1   No facility-administered medications prior to visit.    PAST MEDICAL HISTORY: Past Medical History:  Diagnosis Date   Abdominal cramps    Asthma    Back problem    BAD DISK IN BACK   DDD (degenerative disc disease), lumbosacral    Gastritis    GERD (gastroesophageal reflux disease)    H/O hiatal hernia    Heart murmur    Years ago   Hyperlipidemia    Hypertension    Knee problem    Leg cramps    Migraine    01/22/2019- last one > 1 month ago   Post concussion syndrome    Seizure ([place])    1 many years ago with a migraine   Shortness of breath    Stroke ([place])    TIA  slight memory  issues TIA prior to 2010   Ulcer    STOMACH & SORENESS & PAIN   UTI (urinary tract infection) 07/02/2012    PAST SURGICAL HISTORY: Past Surgical History:  Procedure Laterality Date   ABDOMINAL HYSTERECTOMY     ARTHROGRAM KNEE Right    MCL tear   COLONOSCOPY     ENDOSCOPY  2020   hiatal hernia, gastritis, and some bacteria in stomach   HEMORRHOID SURGERY      FAMILY HISTORY: Family History  Problem Relation Age of Onset   Hypertension Mother    Hyperlipidemia Mother    Asthma Father    Cancer Father    Hypertension Brother    Sleep apnea Son    Breast cancer Neg Hx      SOCIAL HISTORY: Social History   Socioeconomic History   Marital status: Married    Spouse name: Eddie   Number of children: 3   Years of education: Some college   Highest education level: Not on file  Occupational History   Occupation: works full time  Tobacco Use   Smoking status: Never   Smokeless tobacco: Never  Vaping Use   Vaping Use: Never used  Substance and Sexual Activity   Alcohol use: No   Drug use: No   Sexual activity: Not on file  Other Topics Concern   Not on file  Social History Narrative   Lives with husband   Caffeine use: Coffee/tea/soda sometimes per patient   Social Determinants of Health   Financial Resource Strain: Not on file  Food Insecurity: Not on file  Transportation Needs: Not on file  Physical Activity: Not on file  Stress: Not on file  Social Connections: Not on file  Intimate Partner Violence: Not on file      PHYSICAL EXAM  Vitals:   11/18/20 1048  BP: (!) 133/96  Pulse: 81  Weight: 218 lb 6.4 oz (99.1 kg)  Height: 5\' 6"  (1.676 m)   Body mass index is 35.25 kg/m.  Generalized: Well developed, in no acute distress  Chest: Lungs clear to auscultation bilaterally  Neurological examination  Mentation: Alert oriented to time, place, history taking. Follows all commands speech and language fluent Cranial nerve II-XII: Extraocular movements were full, visual field were full on confrontational test Head turning and shoulder shrug  were normal and symmetric. Motor: The motor testing reveals 5 over 5 strength of all 4 extremities. Good symmetric motor tone is noted throughout.  Sensory: Sensory testing is intact to soft touch on all 4 extremities. No evidence of extinction is noted.  Gait and station: Gait is normal.    DIAGNOSTIC DATA (LABS, IMAGING, TESTING) - I reviewed patient records, labs, notes, testing and imaging myself where available.  Lab Results  Component Value Date   WBC 6.9 07/07/2020   HGB 13.5  07/07/2020   HCT 40.6 07/07/2020   MCV 89.8 07/07/2020   PLT 333 07/07/2020      Component Value Date/Time   NA 140 07/07/2020 1750   NA 142 08/31/2015 1313   K 3.7 07/07/2020 1750   CL 104 07/07/2020 1750   CO2 27 07/07/2020 1750   GLUCOSE 100 (H) 07/07/2020 1750   BUN 11 07/07/2020 1750   BUN 12 08/31/2015 1313   CREATININE 0.95 07/07/2020 1750   CALCIUM 9.9 07/07/2020 1750   PROT 7.1 08/31/2015 1313   ALBUMIN 4.4 08/31/2015 1313   AST 14 08/31/2015 1313   ALT 9 08/31/2015 1313   ALKPHOS 74 08/31/2015 1313   BILITOT 0.3 08/31/2015 1313   GFRNONAA >60 07/07/2020 1750   GFRAA >60 01/22/2019 1030      ASSESSMENT AND PLAN 65 y.o. year old female  has a past medical history of Abdominal cramps, Asthma, Back problem, DDD (degenerative disc disease), lumbosacral, Gastritis, GERD (gastroesophageal reflux disease), H/O hiatal hernia, Heart murmur, Hyperlipidemia, Hypertension, Knee problem, Leg cramps, Migraine, Post concussion syndrome, Seizure ([place]), Shortness of breath, Stroke ([place]), Ulcer, and UTI (urinary tract infection) (07/02/2012). here with:  OSA on CPAP  - CPAP compliance suboptimal  - Good treatment of AHI  - Encourage patient to use CPAP nightly and > 4 hours each night - F/U in 1 year or sooner if needed   Ward Givens, MSN, NP-C 11/18/2020, 10:57 AM [facility] [address], [place], [place] [address] [phone]

## 2020-11-18 NOTE — Patient Instructions (Addendum)
Continue using CPAP nightly and greater than 4 hours each night Change pressure 8-12 If your symptoms worsen or you develop new symptoms please let us know.   so that but the biggest issue started consistent with the CPAP so the brain gets used to take this is the

## 2020-11-21 NOTE — Progress Notes (Addendum)
CM sent for new cpap order in [employer]. Received. Denyse Amass, RN got it!      Previous Messages   ----- Message -----  From: Brandon Melnick, RN  Sent: 11/21/2020   7:38 AM EST  To: Vanessa Ralphs, Marchelle Gearing  Subject: cpa[                                           New order in [place] for pt Savannah Taylor  Female, 65 y.o., 1955-08-17  MRN:  [id number]  Thank you Lovey Newcomer RN

## 2020-11-22 DIAGNOSIS — G4733 Obstructive sleep apnea (adult) (pediatric): Secondary | ICD-10-CM | POA: Diagnosis not present

## 2020-12-12 ENCOUNTER — Ambulatory Visit
Admission: RE | Admit: 2020-12-12 | Discharge: 2020-12-12 | Disposition: A | Discharge status: Home / Self Care | Payer: Medicare HMO | Source: Ambulatory Visit | Attending: Family Medicine | Admitting: Family Medicine

## 2020-12-12 ENCOUNTER — Other Ambulatory Visit: Payer: Self-pay

## 2020-12-12 DIAGNOSIS — Z1231 Encounter for screening mammogram for malignant neoplasm of breast: Secondary | ICD-10-CM

## 2020-12-14 ENCOUNTER — Other Ambulatory Visit: Payer: Self-pay | Admitting: Family Medicine

## 2020-12-14 DIAGNOSIS — R928 Other abnormal and inconclusive findings on diagnostic imaging of breast: Secondary | ICD-10-CM

## 2020-12-15 DIAGNOSIS — G4733 Obstructive sleep apnea (adult) (pediatric): Secondary | ICD-10-CM | POA: Diagnosis not present

## 2020-12-22 DIAGNOSIS — G4733 Obstructive sleep apnea (adult) (pediatric): Secondary | ICD-10-CM | POA: Diagnosis not present

## 2020-12-28 DIAGNOSIS — E349 Endocrine disorder, unspecified: Secondary | ICD-10-CM | POA: Diagnosis not present

## 2020-12-28 DIAGNOSIS — Z1331 Encounter for screening for depression: Secondary | ICD-10-CM | POA: Diagnosis not present

## 2020-12-28 DIAGNOSIS — Z131 Encounter for screening for diabetes mellitus: Secondary | ICD-10-CM | POA: Diagnosis not present

## 2020-12-28 DIAGNOSIS — E8881 Metabolic syndrome: Secondary | ICD-10-CM | POA: Diagnosis not present

## 2020-12-28 DIAGNOSIS — D539 Nutritional anemia, unspecified: Secondary | ICD-10-CM | POA: Diagnosis not present

## 2020-12-28 DIAGNOSIS — R0602 Shortness of breath: Secondary | ICD-10-CM | POA: Diagnosis not present

## 2020-12-28 DIAGNOSIS — R5383 Other fatigue: Secondary | ICD-10-CM | POA: Diagnosis not present

## 2020-12-28 DIAGNOSIS — Z1339 Encounter for screening examination for other mental health and behavioral disorders: Secondary | ICD-10-CM | POA: Diagnosis not present

## 2020-12-28 DIAGNOSIS — E7800 Pure hypercholesterolemia, unspecified: Secondary | ICD-10-CM | POA: Diagnosis not present

## 2020-12-28 DIAGNOSIS — E559 Vitamin D deficiency, unspecified: Secondary | ICD-10-CM | POA: Diagnosis not present

## 2020-12-28 DIAGNOSIS — E669 Obesity, unspecified: Secondary | ICD-10-CM | POA: Diagnosis not present

## 2020-12-28 DIAGNOSIS — Z79899 Other long term (current) drug therapy: Secondary | ICD-10-CM | POA: Diagnosis not present

## 2020-12-28 DIAGNOSIS — I10 Essential (primary) hypertension: Secondary | ICD-10-CM | POA: Diagnosis not present

## 2020-12-28 DIAGNOSIS — Z1159 Encounter for screening for other viral diseases: Secondary | ICD-10-CM | POA: Diagnosis not present

## 2020-12-29 DIAGNOSIS — H524 Presbyopia: Secondary | ICD-10-CM | POA: Diagnosis not present

## 2021-01-06 DIAGNOSIS — L814 Other melanin hyperpigmentation: Secondary | ICD-10-CM | POA: Diagnosis not present

## 2021-01-06 DIAGNOSIS — L538 Other specified erythematous conditions: Secondary | ICD-10-CM | POA: Diagnosis not present

## 2021-01-06 DIAGNOSIS — L820 Inflamed seborrheic keratosis: Secondary | ICD-10-CM | POA: Diagnosis not present

## 2021-01-06 DIAGNOSIS — L815 Leukoderma, not elsewhere classified: Secondary | ICD-10-CM | POA: Diagnosis not present

## 2021-01-06 DIAGNOSIS — D171 Benign lipomatous neoplasm of skin and subcutaneous tissue of trunk: Secondary | ICD-10-CM | POA: Diagnosis not present

## 2021-01-13 ENCOUNTER — Ambulatory Visit
Admission: RE | Admit: 2021-01-13 | Discharge: 2021-01-13 | Disposition: A | Discharge status: Home / Self Care | Payer: Medicare HMO | Source: Ambulatory Visit | Attending: Family Medicine | Admitting: Family Medicine

## 2021-01-13 ENCOUNTER — Other Ambulatory Visit: Payer: Self-pay | Admitting: Family Medicine

## 2021-01-13 DIAGNOSIS — R921 Mammographic calcification found on diagnostic imaging of breast: Secondary | ICD-10-CM

## 2021-01-13 DIAGNOSIS — R928 Other abnormal and inconclusive findings on diagnostic imaging of breast: Secondary | ICD-10-CM

## 2021-01-15 DIAGNOSIS — G4733 Obstructive sleep apnea (adult) (pediatric): Secondary | ICD-10-CM | POA: Diagnosis not present

## 2021-01-17 DIAGNOSIS — Z8673 Personal history of transient ischemic attack (TIA), and cerebral infarction without residual deficits: Secondary | ICD-10-CM | POA: Diagnosis not present

## 2021-01-17 DIAGNOSIS — R002 Palpitations: Secondary | ICD-10-CM | POA: Diagnosis not present

## 2021-01-17 DIAGNOSIS — R0602 Shortness of breath: Secondary | ICD-10-CM | POA: Diagnosis not present

## 2021-01-22 DIAGNOSIS — G4733 Obstructive sleep apnea (adult) (pediatric): Secondary | ICD-10-CM | POA: Diagnosis not present

## 2021-01-25 ENCOUNTER — Encounter: Payer: Self-pay | Admitting: *Deleted

## 2021-01-26 ENCOUNTER — Institutional Professional Consult (permissible substitution): Payer: Medicare HMO | Admitting: Neurology

## 2021-01-26 ENCOUNTER — Ambulatory Visit
Admission: RE | Admit: 2021-01-26 | Discharge: 2021-01-26 | Disposition: A | Discharge status: Home / Self Care | Payer: Medicare HMO | Source: Ambulatory Visit | Attending: Family Medicine | Admitting: Family Medicine

## 2021-01-26 DIAGNOSIS — R921 Mammographic calcification found on diagnostic imaging of breast: Secondary | ICD-10-CM | POA: Diagnosis not present

## 2021-01-26 DIAGNOSIS — N6489 Other specified disorders of breast: Secondary | ICD-10-CM | POA: Diagnosis not present

## 2021-01-31 DIAGNOSIS — H52209 Unspecified astigmatism, unspecified eye: Secondary | ICD-10-CM | POA: Diagnosis not present

## 2021-01-31 DIAGNOSIS — H5203 Hypermetropia, bilateral: Secondary | ICD-10-CM | POA: Diagnosis not present

## 2021-01-31 DIAGNOSIS — H524 Presbyopia: Secondary | ICD-10-CM | POA: Diagnosis not present

## 2021-02-01 DIAGNOSIS — G4733 Obstructive sleep apnea (adult) (pediatric): Secondary | ICD-10-CM | POA: Diagnosis not present

## 2021-02-02 DIAGNOSIS — N62 Hypertrophy of breast: Secondary | ICD-10-CM | POA: Diagnosis not present

## 2021-02-02 DIAGNOSIS — D171 Benign lipomatous neoplasm of skin and subcutaneous tissue of trunk: Secondary | ICD-10-CM | POA: Diagnosis not present

## 2021-02-08 ENCOUNTER — Other Ambulatory Visit: Payer: Self-pay

## 2021-02-08 ENCOUNTER — Encounter: Payer: Self-pay | Admitting: Plastic Surgery

## 2021-02-08 ENCOUNTER — Ambulatory Visit: Payer: Medicare HMO | Admitting: Plastic Surgery

## 2021-02-08 DIAGNOSIS — M542 Cervicalgia: Secondary | ICD-10-CM | POA: Diagnosis not present

## 2021-02-08 DIAGNOSIS — G8929 Other chronic pain: Secondary | ICD-10-CM

## 2021-02-08 DIAGNOSIS — N62 Hypertrophy of breast: Secondary | ICD-10-CM

## 2021-02-08 DIAGNOSIS — M546 Pain in thoracic spine: Secondary | ICD-10-CM | POA: Diagnosis not present

## 2021-02-08 DIAGNOSIS — G4733 Obstructive sleep apnea (adult) (pediatric): Secondary | ICD-10-CM | POA: Diagnosis not present

## 2021-02-08 DIAGNOSIS — M549 Dorsalgia, unspecified: Secondary | ICD-10-CM | POA: Insufficient documentation

## 2021-02-08 NOTE — Progress Notes (Signed)
Patient ID: Savannah Taylor, female    DOB: July 27, 1955, 66 y.o.   MRN: [id number]   Chief Complaint  Patient presents with   Advice Only   Breast Problem    Mammary Hyperplasia: The patient is a 66 y.o. female with a history of mammary hyperplasia for several years.  She has extremely large breasts causing symptoms that include the following: Back pain in the upper and lower back, including neck pain. She pulls or pins her bra straps to provide better lift and relief of the pressure and pain. She notices relief by holding her breast up manually.  Her shoulder straps cause grooves and pain and pressure that requires padding for relief. Pain medication is sometimes required with motrin and tylenol.  Activities that are hindered by enlarged breasts include: exercise and running.  She has tried supportive clothing as well as fitted bras without improvement.  Her breasts are extremely large with the left longer than the right.  She has hyperpigmentation of the inframammary area on both sides.  The sternal to nipple distance on the right is 36 cm and the left is 40 cm.  The IMF distance is 20 cm.  She is 5 feet 5 inches tall and weighs 201 pounds.  The BMI = 33.4 kg/m.  Preoperative bra size = 42 DD cup. She would like to be a C cup.  The estimated excess breast tissue to be removed at the time of surgery = 630 grams on the left and 630 grams on the right.  Mammogram history: 12/22 and had a clip placed in January with a recommendation of a yearly mammogram.  Family history of breast cancer:  none.  Tobacco use:  none and no diabetes.  The patient also had back surgery, cholecystectomy, knee surgery and wrist surgery. The patient expresses the desire to pursue surgical intervention. She has a lipoma on her back that Dr. Kieth Brightly is planning on removing and would like to do it at the same time if possible.   Review of Systems  Constitutional:  Positive for activity change. Negative for appetite  change.  Eyes: Negative.   Respiratory: Negative.  Negative for chest tightness.   Cardiovascular: Negative.   Gastrointestinal: Negative.   Endocrine: Negative.   Genitourinary: Negative.   Musculoskeletal: Negative.   Skin: Negative.   Hematological: Negative.   Psychiatric/Behavioral: Negative.     Past Medical History:  Diagnosis Date   Abdominal cramps    Asthma    Back problem    BAD DISK IN BACK   DDD (degenerative disc disease), lumbosacral    Depression    Gastritis    GERD (gastroesophageal reflux disease)    H/O hiatal hernia    Heart murmur    Years ago   Hyperlipidemia    Hypertension    Insomnia    Knee problem    Leg cramps    Migraine    01/22/2019- last one > 1 month ago   Post concussion syndrome    Seizure ([place])    1 many years ago with a migraine   Shortness of breath    Stroke ([place])    TIA  slight memory  issues TIA prior to 2010   Ulcer    STOMACH & SORENESS & PAIN   UTI (urinary tract infection) 07/02/2012    Past Surgical History:  Procedure Laterality Date   ABDOMINAL HYSTERECTOMY     ARTHROGRAM KNEE Right    MCL tear  BACK SURGERY     PLIF L4-5, 5-S1 Dr. Ronnald Ramp 01-2019   COLONOSCOPY     ENDOSCOPY  2020   hiatal hernia, gastritis, and some bacteria in stomach   HEMORRHOID SURGERY        Current Outpatient Medications:    albuterol (PROAIR HFA) 108 (90 Base) MCG/ACT inhaler, Inhale 2 puffs into the lungs every 4 (four) hours as needed for wheezing or shortness of breath. (Patient not taking: Reported on 11/18/2020), Disp: 1 Inhaler, Rfl: 1   albuterol (PROVENTIL) (2.5 MG/3ML) 0.083% nebulizer solution, Take 2.5 mg by nebulization every 6 (six) hours as needed for wheezing or shortness of breath., Disp: , Rfl:    ALPRAZolam (XANAX) 1 MG tablet, Take 0.5 mg by mouth at bedtime as needed for anxiety. , Disp: , Rfl:    amLODipine (NORVASC) 5 MG tablet, Take 5 mg by mouth daily. (Patient not taking: Reported on 11/18/2020), Disp: , Rfl:     Biotin 1 MG CAPS, Take 1 mg by mouth daily., Disp: , Rfl:    buPROPion (WELLBUTRIN XL) 150 MG 24 hr tablet, Take 150 mg by mouth daily. (Patient not taking: Reported on 11/18/2020), Disp: , Rfl:    cholecalciferol (VITAMIN D3) 25 MCG (1000 UNIT) tablet, Take 1,000 Units by mouth daily. (Patient not taking: Reported on 11/18/2020), Disp: , Rfl:    clonazePAM (KLONOPIN) 0.5 MG tablet, Take 0.5-1 mg by mouth at bedtime. (Patient not taking: Reported on 11/18/2020), Disp: , Rfl:    Erenumab-aooe (AIMOVIG) 140 MG/ML SOAJ, Inject 140 mg into the skin every 30 (thirty) days., Disp: 3 mL, Rfl: 3   ezetimibe (ZETIA) 10 MG tablet, Take 10 mg by mouth daily., Disp: , Rfl:    gabapentin (NEURONTIN) 300 MG capsule, Take 600 mg by mouth at bedtime as needed (pain).  (Patient not taking: Reported on 11/18/2020), Disp: , Rfl:    methocarbamol (ROBAXIN) 500 MG tablet, Take 1 tablet (500 mg total) by mouth 2 (two) times daily. (Patient not taking: Reported on 11/18/2020), Disp: 20 tablet, Rfl: 0   methylPREDNISolone (MEDROL DOSEPAK) 4 MG TBPK tablet, Take by mouth daily with breakfast. For 6 days. 6-5-4-3-2-1 (Patient not taking: Reported on 11/18/2020), Disp: 21 tablet, Rfl: 1   mometasone-formoterol (DULERA) 100-5 MCG/ACT AERO, Take 2 puffs first thing in am and then another 2 puffs about 12 hours later. (Patient taking differently: Inhale 2 puffs into the lungs 2 (two) times daily as needed for wheezing or shortness of breath.), Disp: 1 Inhaler, Rfl: 11   Rimegepant Sulfate (NURTEC) 75 MG TBDP, Take 75 mg by mouth daily as needed (take for abortive therapy of migraine, no more than 1 tablet in 24 hours or 10 per month)., Disp: 8 tablet, Rfl: 11   traZODone (DESYREL) 50 MG tablet, 1 tablet at bedtime as needed, Disp: , Rfl:    vitamin B-12 (CYANOCOBALAMIN) 1000 MCG tablet, Take 1,000 mcg by mouth daily., Disp: , Rfl:    Objective:   Vitals:   02/08/21 1427  BP: (!) 151/95  Pulse: 96  SpO2: 97%    Physical  Exam Vitals and nursing note reviewed.  Constitutional:      Appearance: Normal appearance.  HENT:     Head: Normocephalic and atraumatic.  Cardiovascular:     Rate and Rhythm: Normal rate.     Pulses: Normal pulses.  Pulmonary:     Effort: Pulmonary effort is normal. No respiratory distress.  Abdominal:     General: There is no distension.  Palpations: Abdomen is soft.     Tenderness: There is no abdominal tenderness.  Musculoskeletal:        General: Deformity present. No swelling.  Skin:    General: Skin is warm.     Capillary Refill: Capillary refill takes less than 2 seconds.     Coloration: Skin is not jaundiced.     Findings: No bruising.  Neurological:     Mental Status: She is alert and oriented to person, place, and time.  Psychiatric:        Behavior: Behavior normal.        Thought Content: Thought content normal.    Assessment & Plan:  Chronic bilateral thoracic back pain  Neck pain  Symptomatic mammary hypertrophy  The procedure the patient selected and that was best for the patient was discussed. The risk were discussed and include but not limited to the following:  Breast asymmetry, fluid accumulation, firmness of the breast, inability to breast feed, loss of nipple or areola, skin loss, change in skin and nipple sensation, fat necrosis of the breast tissue, bleeding, infection and healing delay.  There are risks of anesthesia and injury to nerves or blood vessels.  Allergic reaction to tape, suture and skin glue are possible.  There will be swelling.  Any of these can lead to the need for revisional surgery.  A breast reduction has potential to interfere with diagnostic procedures in the future.  This procedure is best done when the breast is fully developed.  Changes in the breast will continue to occur over time: pregnancy, weight gain or weigh loss.    Total time: 40 minutes. This includes time spent with the patient during the visit as well as time spent  before and after the visit reviewing the chart, documenting the encounter and ordering pertinent studies. and literature emailed to the patient.   Physical therapy:  ordered Mammogram:  done and negative  The patient is a good candidate for bilateral breast reduction with possible liposuction.  I cannot guarantee her a certain bra size but understand she wants to be proportional.  She will call us as soon as she finishes physical therapy.  We will try to coordinate this with Dr. Kieth Brightly who is going to excise the lipoma on her back.  Pictures were obtained of the patient and placed in the chart with the patient's or guardian's permission.   [place], DO

## 2021-02-08 NOTE — Addendum Note (Signed)
Addended by: Lindon Romp on: 02/08/2021 04:31 PM   Modules accepted: Orders

## 2021-02-08 NOTE — Addendum Note (Signed)
Addended by: Wallace Going on: 02/08/2021 03:34 PM   Modules accepted: Orders

## 2021-02-13 DIAGNOSIS — G4733 Obstructive sleep apnea (adult) (pediatric): Secondary | ICD-10-CM | POA: Diagnosis not present

## 2021-02-14 ENCOUNTER — Institutional Professional Consult (permissible substitution): Payer: Medicare HMO | Admitting: Neurology

## 2021-02-14 DIAGNOSIS — H52209 Unspecified astigmatism, unspecified eye: Secondary | ICD-10-CM | POA: Diagnosis not present

## 2021-02-14 DIAGNOSIS — H524 Presbyopia: Secondary | ICD-10-CM | POA: Diagnosis not present

## 2021-02-14 DIAGNOSIS — H5203 Hypermetropia, bilateral: Secondary | ICD-10-CM | POA: Diagnosis not present

## 2021-02-15 DIAGNOSIS — G4733 Obstructive sleep apnea (adult) (pediatric): Secondary | ICD-10-CM | POA: Diagnosis not present

## 2021-02-20 DIAGNOSIS — R0602 Shortness of breath: Secondary | ICD-10-CM | POA: Diagnosis not present

## 2021-02-20 DIAGNOSIS — Z8673 Personal history of transient ischemic attack (TIA), and cerebral infarction without residual deficits: Secondary | ICD-10-CM | POA: Diagnosis not present

## 2021-02-22 DIAGNOSIS — G4733 Obstructive sleep apnea (adult) (pediatric): Secondary | ICD-10-CM | POA: Diagnosis not present

## 2021-02-23 NOTE — Therapy (Signed)
OUTPATIENT PHYSICAL THERAPY CERVICAL EVALUATION   Patient Name: Savannah Taylor MRN: [id number] DOB:1955/12/27, 66 y.o., female Today's Date: 02/27/2021   PT End of Session - 02/27/21 1420     Visit Number 1    Number of Visits 13    Date for PT Re-Evaluation 04/15/21    Authorization Type [employer] MCR    Progress Note Due on Visit 10    PT Start Time 7793    PT Stop Time 9030    PT Time Calculation (min) 43 min    Activity Tolerance Patient tolerated treatment well    Behavior During Therapy WFL for tasks assessed/performed             Past Medical History:  Diagnosis Date   Abdominal cramps    Asthma    Back problem    BAD DISK IN BACK   DDD (degenerative disc disease), lumbosacral    Depression    Gastritis    GERD (gastroesophageal reflux disease)    H/O hiatal hernia    Heart murmur    Years ago   Hyperlipidemia    Hypertension    Insomnia    Knee problem    Leg cramps    Migraine    01/22/2019- last one > 1 month ago   Post concussion syndrome    Seizure ([place])    1 many years ago with a migraine   Shortness of breath    Stroke ([place])    TIA  slight memory  issues TIA prior to 2010   Ulcer    STOMACH & SORENESS & PAIN   UTI (urinary tract infection) 07/02/2012   Past Surgical History:  Procedure Laterality Date   ABDOMINAL HYSTERECTOMY     ARTHROGRAM KNEE Right    MCL tear   BACK SURGERY     PLIF L4-5, 5-S1 Dr. Ronnald Ramp 01-2019   COLONOSCOPY     ENDOSCOPY  2020   hiatal hernia, gastritis, and some bacteria in stomach   HEMORRHOID SURGERY     Patient Active Problem List   Diagnosis Date Noted   Back pain 02/08/2021   Neck pain 02/08/2021   Symptomatic mammary hypertrophy 02/08/2021   S/P lumbar fusion 01/26/2019   Morbid obesity due to excess calories ([place]) 10/22/2015   Upper airway cough syndrome 09/21/2015   Chronic migraine w/o aura w/o status migrainosus, not intractable 08/31/2015   Cough variant asthma 08/19/2015   Hypokalemia 07/02/2012    UTI (urinary tract infection) 07/02/2012   ARF (acute renal failure) ([place]) 07/02/2012   Dyspnea 09/04/2011   HTN (hypertension), benign 09/04/2011    PCP: Angelina Pih, MD  REFERRING PROVIDER: Wallace Going, DO  REFERRING DIAG: [id number] (ICD-10-CM) - Chronic bilateral thoracic back pain M54.2 (ICD-10-CM) - Neck pain N62 (ICD-10-CM) - Symptomatic mammary hypertrophy   THERAPY DIAG:  Cervicalgia - Plan: PT plan of care cert/re-cert  Muscle weakness (generalized) - Plan: PT plan of care cert/re-cert  Abnormal posture - Plan: PT plan of care cert/re-cert  ONSET DATE: chronic; >5 years   SUBJECTIVE:  SUBJECTIVE STATEMENT: Patient reports because she is "heavy chested" she is having neck and shoulder pain as soon as she goes to put on her bra. She reports indentations on her clavicle due to her breast size. She reports the pain has been ongoing for at least 5-10 years, but is progressively worsening to the point of where she has to readjust her bra strap off of her shoulder to help provide relief. She reports numbness in her upper traps and feelings of heaviness in her arms. She is interested in undergoing breast reduction due to this chronic pain.   PERTINENT HISTORY:  N/A  PAIN:  Are you having pain? Yes NPRS scale: 6/10 NPRS worst score: 9/10 Pain location: bilateral upper traps, posterior neck  PAIN TYPE: numb, sore  Pain description: constant  Aggravating factors: bra, laying on the left side Relieving factors: readjust/lift breast to relieve pressure   PRECAUTIONS: None  WEIGHT BEARING RESTRICTIONS No  FALLS:  Has patient fallen in last 6 months? No Number of falls: 0  LIVING ENVIRONMENT: Lives with: lives with their spouse Lives in:  House/apartment Stairs: Yes; 3 stairs to enter; flight of stairs inside  Has following equipment at home: None  OCCUPATION: disability   PLOF: Independent  PATIENT GOALS "I want to see if this will relieve some of the pain."   OBJECTIVE:   DIAGNOSTIC FINDINGS:  N/A  PATIENT SURVEYS:  N/A   COGNITION: Overall cognitive status: Within functional limits for tasks assessed  POSTURE:  Forward head, rounded shoulders   PALPATION: Tautness and palpable tenderness bilateral upper traps, cervical paraspinals, levator scapulae   CERVICAL AROM/PROM  A/PROM A/PROM (deg) 02/27/2021  Flexion WNL; increased pain Lt upper trap   Extension WNL  Right lateral flexion WNL; increased pain Lt upper trap  Left lateral flexion WNL; increased pain Rt upper trap   Right rotation 55 pain posterior neck  Left rotation 55 pain posterior neck    (Blank rows = not tested)  UE AROM/PROM:  A/PROM Right 02/27/2021 Left 02/27/2021  Shoulder flexion    Shoulder extension    Shoulder abduction    Shoulder adduction    Shoulder extension    Shoulder internal rotation    Shoulder external rotation    Elbow flexion    Elbow extension    Wrist flexion    Wrist extension    Wrist ulnar deviation    Wrist radial deviation    Wrist pronation    Wrist supination     (Blank rows = not tested)  UE MMT:  MMT Right 02/27/2021 Left 02/27/2021  Shoulder flexion 4+ 4  Shoulder extension    Shoulder abduction 4+ 4  Shoulder adduction    Shoulder extension    Shoulder internal rotation    Shoulder external rotation    Middle trapezius 4- 3+  Lower trapezius    Elbow flexion    Elbow extension    Wrist flexion    Wrist extension    Wrist ulnar deviation    Wrist radial deviation    Wrist pronation    Wrist supination    Grip strength     (Blank rows = not tested)  CERVICAL SPECIAL TESTS:  N/A  FUNCTIONAL TESTS:  N/A    TODAY'S TREATMENT:  [facility] Adult PT Treatment:  DATE: 02/27/21 Therapeutic Exercise: Demonstrated and issue initial HEP.   Therapeutic Activity: Education on assessment findings that will be addressed throughout duration of POC.       PATIENT EDUCATION:  Education details: see treatment above  Person educated: Patient Education method: Explanation, Demonstration, Tactile cues, Verbal cues, and Handouts Education comprehension: verbalized understanding, returned demonstration, verbal cues required, tactile cues required, and needs further education   HOME EXERCISE PROGRAM: Access Code: [email or web address] URL: [email or web address] Date: 02/27/2021 Prepared by: Gwendolyn Grant  Exercises Seated Upper Trapezius Stretch - 2 x daily - 7 x weekly - 3 sets - 30 sec hold Seated Levator Scapulae Stretch - 2 x daily - 7 x weekly - 3 sets - 30 sec hold Doorway Pec Stretch at 90 Degrees Abduction - 2 x daily - 7 x weekly - 3 sets - 10 reps - 30 sec hold Seated Scapular Retraction - 2 x daily - 7 x weekly - 2 sets - 10 reps   ASSESSMENT:  CLINICAL IMPRESSION: Patient is a 66 y.o. female who was seen today for physical therapy evaluation and treatment for chronic neck and shoulder pain that she attributes to her breast size. Upon assessment she has limited cervical rotation AROM and increased pain with all cervical ROM with exception of cervical extension.  She has bilateral shoulder (Lt>Rt) and periscapular weakness. She has poor posture demonstrating forward head/rounded shoulders. She will benefit from skilled PT to address the above stated deficits in order to optimize her function as she prepares for breast reduction surgery.     OBJECTIVE IMPAIRMENTS decreased ROM, decreased strength, increased fascial restrictions, impaired flexibility, postural dysfunction, and pain.   ACTIVITY LIMITATIONS cleaning, community activity, shopping, and sleep .   PERSONAL FACTORS Age, Fitness, and Time since onset of  injury/illness/exacerbation are also affecting patient's functional outcome.    REHAB POTENTIAL: Good  CLINICAL DECISION MAKING: Stable/uncomplicated  EVALUATION COMPLEXITY: Low   GOALS: Goals reviewed with patient? No  SHORT TERM GOALS:  STG Name Target Date Goal status  1 Patient will be independent and compliant with initial HEP.   Baseline:  03/13/2021 INITIAL  2 Patient will demonstrate knowledge and application of appropriate sitting posture to reduce stress on her neck and shoulders.  Baseline:  03/20/2021 INITIAL   LONG TERM GOALS:   LTG Name Target Date Goal status  1 Patient will demonstrate at least 65 degrees of bilateral cervical rotation AROM to improve ability to complete head turns while driving.  Baseline: 04/10/2021 INITIAL  2 Patient will demonstrate at least 4/5 bilateral middle trap strength to improve scapular stability.  Baseline: 04/10/2021 INITIAL  3 Patient will report pain at worst rated as 5/10 when laying on the left side to improve her sleep quality.  Baseline: 04/10/2021 INITIAL  4 Patient will be independent with advanced home program to assist in management of her chronic condition.  Baseline: 04/10/2021 INITIAL   PLAN: PT FREQUENCY: 1-2x/week  PT DURATION: 6 weeks  PLANNED INTERVENTIONS: Therapeutic exercises, Therapeutic activity, Neuro Muscular re-education, Patient/Family education, Joint mobilization, Dry Needling, Cryotherapy, Moist heat, Taping, and Manual therapy  PLAN FOR NEXT SESSION: review HEP, manual to C-spine, periscapular strengthening, posture education   Gwendolyn Grant, PT, DPT, ATC 02/27/21 4:19 PM

## 2021-02-27 ENCOUNTER — Ambulatory Visit: Payer: Medicare HMO | Attending: Plastic Surgery

## 2021-02-27 ENCOUNTER — Other Ambulatory Visit: Payer: Self-pay

## 2021-02-27 ENCOUNTER — Encounter: Payer: Self-pay | Admitting: Plastic Surgery

## 2021-02-27 DIAGNOSIS — N62 Hypertrophy of breast: Secondary | ICD-10-CM | POA: Insufficient documentation

## 2021-02-27 DIAGNOSIS — R293 Abnormal posture: Secondary | ICD-10-CM | POA: Diagnosis not present

## 2021-02-27 DIAGNOSIS — M546 Pain in thoracic spine: Secondary | ICD-10-CM | POA: Diagnosis not present

## 2021-02-27 DIAGNOSIS — M542 Cervicalgia: Secondary | ICD-10-CM | POA: Diagnosis not present

## 2021-02-27 DIAGNOSIS — M6281 Muscle weakness (generalized): Secondary | ICD-10-CM | POA: Insufficient documentation

## 2021-02-27 DIAGNOSIS — G8929 Other chronic pain: Secondary | ICD-10-CM | POA: Diagnosis not present

## 2021-02-28 ENCOUNTER — Encounter: Payer: Self-pay | Admitting: Plastic Surgery

## 2021-02-28 DIAGNOSIS — Z6833 Body mass index (BMI) 33.0-33.9, adult: Secondary | ICD-10-CM | POA: Diagnosis not present

## 2021-02-28 DIAGNOSIS — R635 Abnormal weight gain: Secondary | ICD-10-CM | POA: Diagnosis not present

## 2021-02-28 DIAGNOSIS — I10 Essential (primary) hypertension: Secondary | ICD-10-CM | POA: Diagnosis not present

## 2021-02-28 DIAGNOSIS — R7303 Prediabetes: Secondary | ICD-10-CM | POA: Diagnosis not present

## 2021-02-28 DIAGNOSIS — E669 Obesity, unspecified: Secondary | ICD-10-CM | POA: Diagnosis not present

## 2021-03-01 DIAGNOSIS — G4733 Obstructive sleep apnea (adult) (pediatric): Secondary | ICD-10-CM | POA: Diagnosis not present

## 2021-03-06 ENCOUNTER — Ambulatory Visit: Payer: Medicare HMO

## 2021-03-06 ENCOUNTER — Telehealth: Payer: Self-pay

## 2021-03-06 NOTE — Telephone Encounter (Signed)
Called patient,  advised excision of lipoma of the back 04/03/2021 with Dr. Kieth Brightly will have to scheduled at a different time from her breast reduction surgery with Dr. Marla Roe . Inquired about the status of PT. Her insurance company advised her she did not have to have PT. She would like a televisit set up with Dr. Marla Roe. Sent message to front desk. ?

## 2021-03-06 NOTE — Therapy (Incomplete)
?OUTPATIENT PHYSICAL THERAPY TREATMENT NOTE ? ? ?Patient Name: Savannah Taylor ?MRN: [id number] ?DOB:[birth year], 66 y.o., female ?Today's Date: 03/06/2021 ? ?PCP: Angelina Pih, MD ?REFERRING PROVIDER:Dillingham, Loel Lofty, DO ? ? ? ?Past Medical History:  ?Diagnosis Date  ? Abdominal cramps   ? Asthma   ? Back problem   ? BAD DISK IN BACK  ? DDD (degenerative disc disease), lumbosacral   ? Depression   ? Gastritis   ? GERD (gastroesophageal reflux disease)   ? H/O hiatal hernia   ? Heart murmur   ? Years ago  ? Hyperlipidemia   ? Hypertension   ? Insomnia   ? Knee problem   ? Leg cramps   ? Migraine   ? 01/22/2019- last one > 1 month ago  ? Post concussion syndrome   ? Seizure ([place])   ? 1 many years ago with a migraine  ? Shortness of breath   ? Stroke [facility])   ? TIA  slight memory  issues TIA prior to 2010  ? Ulcer   ? STOMACH & SORENESS & PAIN  ? UTI (urinary tract infection) 07/02/2012  ? ?Past Surgical History:  ?Procedure Laterality Date  ? ABDOMINAL HYSTERECTOMY    ? ARTHROGRAM KNEE Right   ? MCL tear  ? BACK SURGERY    ? PLIF L4-5, 5-S1 Dr. Ronnald Ramp 01-2019  ? COLONOSCOPY    ? ENDOSCOPY  2020  ? hiatal hernia, gastritis, and some bacteria in stomach  ? HEMORRHOID SURGERY    ? ?Patient Active Problem List  ? Diagnosis Date Noted  ? Back pain 02/08/2021  ? Neck pain 02/08/2021  ? Symptomatic mammary hypertrophy 02/08/2021  ? S/P lumbar fusion 01/26/2019  ? Morbid obesity due to excess calories ([place]) 10/22/2015  ? Upper airway cough syndrome 09/21/2015  ? Chronic migraine w/o aura w/o status migrainosus, not intractable 08/31/2015  ? Cough variant asthma 08/19/2015  ? Hypokalemia 07/02/2012  ? UTI (urinary tract infection) 07/02/2012  ? ARF (acute renal failure) ([place]) 07/02/2012  ? Dyspnea 09/04/2011  ? HTN (hypertension), benign 09/04/2011  ? ? ?REFERRING DIAG: M54.6,G89.29 (ICD-10-CM) - Chronic bilateral thoracic back pain M54.2 (ICD-10-CM) - Neck pain N62 (ICD-10-CM) - Symptomatic mammary hypertrophy  ? ?THERAPY  DIAG:  ?No diagnosis found. ? ?PERTINENT HISTORY: N/A ? ?PRECAUTIONS: None ? ?SUBJECTIVE: *** ? ?PAIN:  ?Are you having pain? {yes/no:20286} ?NPRS scale: ***/10 ?Pain location: *** ?Pain orientation: {Pain Orientation:25161}  ?PAIN TYPE: {type:313116} ?Pain description: {PAIN DESCRIPTION:21022940}  ?Aggravating factors: *** ?Relieving factors: *** ? ? ?OBJECTIVE:  ?  ?DIAGNOSTIC FINDINGS:  ?N/A ?  ?PATIENT SURVEYS:  ?N/A ?  ?  ?COGNITION: ?Overall cognitive status: Within functional limits for tasks assessed ?  ?POSTURE:  ?Forward head, rounded shoulders  ?  ?PALPATION: ?Tautness and palpable tenderness bilateral upper traps, cervical paraspinals, levator scapulae     ?  ?CERVICAL AROM/PROM ?  ?A/PROM A/PROM (deg) ?02/27/2021  ?Flexion WNL; increased pain Lt upper trap   ?Extension WNL  ?Right lateral flexion WNL; increased pain Lt upper trap  ?Left lateral flexion WNL; increased pain Rt upper trap   ?Right rotation 55 pain posterior neck  ?Left rotation 55 pain posterior neck   ? (Blank rows = not tested) ?  ?UE AROM/PROM: ?  ?A/PROM Right ?02/27/2021 Left ?02/27/2021  ?Shoulder flexion      ?Shoulder extension      ?Shoulder abduction      ?Shoulder adduction      ?Shoulder extension      ?  Shoulder internal rotation      ?Shoulder external rotation      ?Elbow flexion      ?Elbow extension      ?Wrist flexion      ?Wrist extension      ?Wrist ulnar deviation      ?Wrist radial deviation      ?Wrist pronation      ?Wrist supination      ? (Blank rows = not tested) ?  ?UE MMT: ?  ?MMT Right ?02/27/2021 Left ?02/27/2021  ?Shoulder flexion 4+ 4  ?Shoulder extension      ?Shoulder abduction 4+ 4  ?Shoulder adduction      ?Shoulder extension      ?Shoulder internal rotation      ?Shoulder external rotation      ?Middle trapezius 4- 3+  ?Lower trapezius      ?Elbow flexion      ?Elbow extension      ?Wrist flexion      ?Wrist extension      ?Wrist ulnar deviation      ?Wrist radial deviation      ?Wrist pronation      ?Wrist  supination      ?Grip strength      ? (Blank rows = not tested) ?  ?CERVICAL SPECIAL TESTS:  ?N/A ?  ?FUNCTIONAL TESTS:  ?N/A ?  ?  ?  ?TODAY'S TREATMENT:  ?[facility] Adult PT Treatment:                                                DATE: 03/06/21 ?Therapeutic Exercise: ?*** ?Manual Therapy: ?*** ?Neuromuscular re-ed: ?*** ?Therapeutic Activity: ?*** ?Modalities: ?*** ?Self Care: ?*** ? ? ?[place] Adult PT Treatment:                                                DATE: 02/27/21 ?Therapeutic Exercise: ?Demonstrated and issue initial HEP.  ?  ?Therapeutic Activity: ?Education on assessment findings that will be addressed throughout duration of POC.  ?  ?  ?  ?  ?  ?PATIENT EDUCATION:  ?Education details: see treatment above  ?Person educated: Patient ?Education method: Explanation, Demonstration, Tactile cues, Verbal cues, and Handouts ?Education comprehension: verbalized understanding, returned demonstration, verbal cues required, tactile cues required, and needs further education ?  ?  ?HOME EXERCISE PROGRAM: ?Access Code: [email or web address] ?URL: [email or web address] ?Date: 02/27/2021 ?Prepared by: Gwendolyn Grant ?  ?Exercises ?Seated Upper Trapezius Stretch - 2 x daily - 7 x weekly - 3 sets - 30 sec hold ?Seated Levator Scapulae Stretch - 2 x daily - 7 x weekly - 3 sets - 30 sec hold ?Doorway Pec Stretch at 90 Degrees Abduction - 2 x daily - 7 x weekly - 3 sets - 10 reps - 30 sec hold ?Seated Scapular Retraction - 2 x daily - 7 x weekly - 2 sets - 10 reps ?  ?  ?ASSESSMENT: ?  ?CLINICAL IMPRESSION: ? ?  ?  ?  ?OBJECTIVE IMPAIRMENTS decreased ROM, decreased strength, increased fascial restrictions, impaired flexibility, postural dysfunction, and pain.  ?  ?ACTIVITY LIMITATIONS cleaning, community activity, shopping, and sleep .  ?  ?PERSONAL FACTORS Age, Fitness, and Time since onset of injury/illness/exacerbation  are also affecting patient's functional outcome.  ?  ?  ?REHAB POTENTIAL: Good ?  ?CLINICAL DECISION MAKING:  Stable/uncomplicated ?  ?EVALUATION COMPLEXITY: Low ?  ?  ?GOALS: ?Goals reviewed with patient? No ?  ?SHORT TERM GOALS: ?  ?STG Name Target Date Goal status  ?1 Patient will be independent and compliant with initial HEP.  ?  ?Baseline:  03/13/2021 INITIAL  ?2 Patient will demonstrate knowledge and application of appropriate sitting posture to reduce stress on her neck and shoulders.  ?Baseline:  03/20/2021 INITIAL  ?  ?LONG TERM GOALS:  ?  ?LTG Name Target Date Goal status  ?1 Patient will demonstrate at least 65 degrees of bilateral cervical rotation AROM to improve ability to complete head turns while driving.  ?Baseline: 04/10/2021 INITIAL  ?2 Patient will demonstrate at least 4/5 bilateral middle trap strength to improve scapular stability.  ?Baseline: 04/10/2021 INITIAL  ?3 Patient will report pain at worst rated as 5/10 when laying on the left side to improve her sleep quality.  ?Baseline: 04/10/2021 INITIAL  ?4 Patient will be independent with advanced home program to assist in management of her chronic condition.  ?Baseline: 04/10/2021 INITIAL  ?  ?PLAN: ?PT FREQUENCY: 1-2x/week ?  ?PT DURATION: 6 weeks ?  ?PLANNED INTERVENTIONS: Therapeutic exercises, Therapeutic activity, Neuro Muscular re-education, Patient/Family education, Joint mobilization, Dry Needling, Cryotherapy, Moist heat, Taping, and Manual therapy ?  ?PLAN FOR NEXT SESSION: review HEP, manual to C-spine, periscapular strengthening, posture education  ?  ?Gwendolyn Grant, PT, DPT, ATC ?02/27/21 4:19 PM ?  ?  ?  ? ?  ? ?

## 2021-03-13 ENCOUNTER — Ambulatory Visit: Payer: Medicare HMO

## 2021-03-20 ENCOUNTER — Ambulatory Visit: Payer: Medicare HMO

## 2021-03-22 ENCOUNTER — Institutional Professional Consult (permissible substitution): Payer: Medicare HMO | Admitting: Neurology

## 2021-03-22 DIAGNOSIS — G4733 Obstructive sleep apnea (adult) (pediatric): Secondary | ICD-10-CM | POA: Diagnosis not present

## 2021-03-27 DIAGNOSIS — Z008 Encounter for other general examination: Secondary | ICD-10-CM | POA: Diagnosis not present

## 2021-03-27 DIAGNOSIS — E669 Obesity, unspecified: Secondary | ICD-10-CM | POA: Diagnosis not present

## 2021-03-27 DIAGNOSIS — I10 Essential (primary) hypertension: Secondary | ICD-10-CM | POA: Diagnosis not present

## 2021-03-27 DIAGNOSIS — G43909 Migraine, unspecified, not intractable, without status migrainosus: Secondary | ICD-10-CM | POA: Diagnosis not present

## 2021-03-27 DIAGNOSIS — Z8673 Personal history of transient ischemic attack (TIA), and cerebral infarction without residual deficits: Secondary | ICD-10-CM | POA: Diagnosis not present

## 2021-03-27 DIAGNOSIS — E785 Hyperlipidemia, unspecified: Secondary | ICD-10-CM | POA: Diagnosis not present

## 2021-03-27 DIAGNOSIS — Z6831 Body mass index (BMI) 31.0-31.9, adult: Secondary | ICD-10-CM | POA: Diagnosis not present

## 2021-03-27 DIAGNOSIS — Z8249 Family history of ischemic heart disease and other diseases of the circulatory system: Secondary | ICD-10-CM | POA: Diagnosis not present

## 2021-03-27 DIAGNOSIS — G4733 Obstructive sleep apnea (adult) (pediatric): Secondary | ICD-10-CM | POA: Diagnosis not present

## 2021-03-27 DIAGNOSIS — Z807 Family history of other malignant neoplasms of lymphoid, hematopoietic and related tissues: Secondary | ICD-10-CM | POA: Diagnosis not present

## 2021-03-27 DIAGNOSIS — Z801 Family history of malignant neoplasm of trachea, bronchus and lung: Secondary | ICD-10-CM | POA: Diagnosis not present

## 2021-03-28 DIAGNOSIS — I10 Essential (primary) hypertension: Secondary | ICD-10-CM | POA: Diagnosis not present

## 2021-03-28 DIAGNOSIS — R635 Abnormal weight gain: Secondary | ICD-10-CM | POA: Diagnosis not present

## 2021-03-28 DIAGNOSIS — R7303 Prediabetes: Secondary | ICD-10-CM | POA: Diagnosis not present

## 2021-03-28 DIAGNOSIS — Z6832 Body mass index (BMI) 32.0-32.9, adult: Secondary | ICD-10-CM | POA: Diagnosis not present

## 2021-03-28 DIAGNOSIS — E669 Obesity, unspecified: Secondary | ICD-10-CM | POA: Diagnosis not present

## 2021-04-01 DIAGNOSIS — G4733 Obstructive sleep apnea (adult) (pediatric): Secondary | ICD-10-CM | POA: Diagnosis not present

## 2021-04-14 DIAGNOSIS — R221 Localized swelling, mass and lump, neck: Secondary | ICD-10-CM | POA: Diagnosis not present

## 2021-04-14 DIAGNOSIS — I10 Essential (primary) hypertension: Secondary | ICD-10-CM | POA: Diagnosis not present

## 2021-04-20 ENCOUNTER — Institutional Professional Consult (permissible substitution): Payer: Medicare HMO | Admitting: Neurology

## 2021-04-22 DIAGNOSIS — G4733 Obstructive sleep apnea (adult) (pediatric): Secondary | ICD-10-CM | POA: Diagnosis not present

## 2021-04-24 NOTE — Progress Notes (Signed)
? ?  Patient ID: Savannah Taylor, female    DOB: 03-01-1955, 66 y.o.   MRN: [id number] ? ?Chief Complaint  ?Patient presents with  ? Pre-op Exam  ? ? ?  ICD-10-CM   ?1. Chronic bilateral thoracic back pain  M54.6   ? G89.29   ?  ?2. Neck pain  M54.2   ?  ?3. Symptomatic mammary hypertrophy  N62   ?  ? ? ?History of Present Illness: ?Savannah Taylor is a 66 y.o.  female  with a history of macromastia.  She presents for preoperative evaluation for upcoming procedure, Bilateral Breast Reduction , scheduled for 05/10/2021 with Dr.  Marla Roe ? ?The patient has not had problems with anesthesia. No history of DVT/PE.  No family history of DVT/PE.  No family or personal history of bleeding or clotting disorders.  Patient is not currently taking any blood thinners.  No history of MI.  She does have history of a TIA greater than 13 years ago. ? ?Summary of Previous Visit: STN on the right is 36 cm and the left is 40 cm.  She is a 42 double D cup and would like to be a C cup.  She had a mammogram December 2022 and had a clip placed. ? ?She has a lipoma on her back that she would like to be excised at the same time by Dr.Kinsinger ? ?Estimated excess breast tissue to be removed at time of surgery: 630 grams ? ?PMH Significant for: Asthma, GERD, hyperlipidemia, hypertension, seizures with migraines, TIA prior to 2010.  History of OSA, occasionally uses her CPAP.  She feels as if it causes her to be congested. ? ?She reports she has been feeling well lately, no recent changes in her health.  Reports asthma is well controlled.  No recent exacerbations. ? ?She is not exactly sure what size she would like to be after surgery ? ?Past Medical History: ?Allergies: ?Allergies  ?Allergen Reactions  ? Bystolic [Nebivolol Hcl] Palpitations and Other (See Comments)  ? Morphine And Related Itching  ? Naproxen Hives and Itching  ? Sertraline Hcl Other (See Comments)  ?  hallucinations  ? Topiramate Other (See Comments)  ?  Apathetic "out of  it"  ? Venlafaxine Hcl Er Other (See Comments)  ?  Hair loss  ? ? ?Current Medications: ? ?Current Outpatient Medications:  ?  albuterol (PROAIR HFA) 108 (90 Base) MCG/ACT inhaler, Inhale 2 puffs into the lungs every 4 (four) hours as needed for wheezing or shortness of breath., Disp: 1 Inhaler, Rfl: 1 ?  albuterol (PROVENTIL) (2.5 MG/3ML) 0.083% nebulizer solution, Take 2.5 mg by nebulization every 6 (six) hours as needed for wheezing or shortness of breath., Disp: , Rfl:  ?  amLODipine (NORVASC) 5 MG tablet, Take 5 mg by mouth daily., Disp: , Rfl:  ?  cephALEXin (KEFLEX) 500 MG capsule, Take 1 capsule (500 mg total) by mouth 4 (four) times daily for 3 days., Disp: 12 capsule, Rfl: 0 ?  Erenumab-aooe (AIMOVIG) 140 MG/ML SOAJ, Inject 140 mg into the skin every 30 (thirty) days., Disp: 3 mL, Rfl: 3 ?  ezetimibe (ZETIA) 10 MG tablet, Take 10 mg by mouth daily., Disp: , Rfl:  ?  ondansetron (ZOFRAN) 4 MG tablet, Take 1 tablet (4 mg total) by mouth every 8 (eight) hours as needed for nausea or vomiting., Disp: 20 tablet, Rfl: 0 ?  oxyCODONE (OXY IR/ROXICODONE) 5 MG immediate release tablet, Take 1 tablet (5 mg total) by mouth  every 6 (six) hours as needed for up to 5 days for severe pain., Disp: 20 tablet, Rfl: 0 ?  Rimegepant Sulfate (NURTEC) 75 MG TBDP, Take 75 mg by mouth daily as needed (take for abortive therapy of migraine, no more than 1 tablet in 24 hours or 10 per month)., Disp: 8 tablet, Rfl: 11 ? ?Past Medical Problems: ?Past Medical History:  ?Diagnosis Date  ? Abdominal cramps   ? Asthma   ? Back problem   ? BAD DISK IN BACK  ? DDD (degenerative disc disease), lumbosacral   ? Depression   ? Gastritis   ? GERD (gastroesophageal reflux disease)   ? H/O hiatal hernia   ? Heart murmur   ? Years ago  ? Hyperlipidemia   ? Hypertension   ? Insomnia   ? Knee problem   ? Leg cramps   ? Migraine   ? 01/22/2019- last one > 1 month ago  ? Post concussion syndrome   ? Seizure ([place])   ? 1 many years ago with a migraine  ?  Shortness of breath   ? Stroke [facility])   ? TIA  slight memory  issues TIA prior to 2010  ? Ulcer   ? STOMACH & SORENESS & PAIN  ? UTI (urinary tract infection) 07/02/2012  ? ? ?Past Surgical History: ?Past Surgical History:  ?Procedure Laterality Date  ? ABDOMINAL HYSTERECTOMY    ? ARTHROGRAM KNEE Right   ? MCL tear  ? BACK SURGERY    ? PLIF L4-5, 5-S1 Dr. Ronnald Ramp 01-2019  ? COLONOSCOPY    ? ENDOSCOPY  2020  ? hiatal hernia, gastritis, and some bacteria in stomach  ? HEMORRHOID SURGERY    ? ? ?Social History: ?Social History  ? ?Socioeconomic History  ? Marital status: Married  ?  Spouse name: [name]  ? Number of children: 3  ? Years of education: Some college  ? Highest education level: Not on file  ?Occupational History  ? Occupation: works full time  ?Tobacco Use  ? Smoking status: Never  ? Smokeless tobacco: Never  ?Vaping Use  ? Vaping Use: Never used  ?Substance and Sexual Activity  ? Alcohol use: No  ? Drug use: No  ? Sexual activity: Not on file  ?Other Topics Concern  ? Not on file  ?Social History Narrative  ? Lives with husband  ? Caffeine use: Coffee/tea/soda sometimes per patient  ? ?Social Determinants of Health  ? ?Financial Resource Strain: Not on file  ?Food Insecurity: Not on file  ?Transportation Needs: Not on file  ?Physical Activity: Not on file  ?Stress: Not on file  ?Social Connections: Not on file  ?Intimate Partner Violence: Not on file  ? ? ?Family History: ?Family History  ?Problem Relation Age of Onset  ? Hypertension Mother   ? Hyperlipidemia Mother   ? Asthma Father   ? Cancer Father   ? Hypertension Brother   ? Sleep apnea Son   ? Breast cancer Neg Hx   ? ? ?Review of Systems: ?Review of Systems  ?Constitutional: Negative.   ?Cardiovascular: Negative.   ?Gastrointestinal: Negative.   ? ?Physical Exam: ?Vital Signs ?BP (!) 152/96 (BP Location: Left Arm, Patient Position: Sitting, Cuff Size: Normal)   Pulse 84   Ht '5\' 5"'$  (1.651 m)   Wt 191 lb 12.8 oz (87 kg)   SpO2 97%   BMI 31.92 kg/m?   ? ?Physical Exam  ?Constitutional:   ?   General: Not in acute  distress. ?   Appearance: Normal appearance. Not ill-appearing.  ?HENT:  ?   Head: Normocephalic and atraumatic.  ?Eyes:  ?   Pupils: Pupils are equal, round ?Neck:  ?   Musculoskeletal: Normal range of motion.  ?Cardiovascular:  ?   Rate and Rhythm: Normal rate ?   Pulses: Normal pulses.  ?Pulmonary:  ?   Effort: Pulmonary effort is normal. No respiratory distress.  ?Musculoskeletal: Normal range of motion.  ?Skin: ?   General: Skin is warm and dry.  ?   Findings: No erythema or rash.  ?Neurological:  ?   General: No focal deficit present.  ?   Mental Status: Alert and oriented to person, place, and time. Mental status is at baseline.  ?   Motor: No weakness.  ?Psychiatric:     ?   Mood and Affect: Mood normal.     ?   Behavior: Behavior normal.  ? ? ?Assessment/Plan: ?The patient is scheduled for bilateral breast reduction with Dr. Marla Roe.  Risks, benefits, and alternatives of procedure discussed, questions answered and consent obtained.   ? ?Smoking Status: Non-smoker; Counseling Given?  N/A ?Last Mammogram: 01/26/2021; Results: Patient had clip placement and biopsy of the right breast, benign fibroadipose tissue with dystrophic calcifications, no malignancy identified. ? ?Caprini Score: 5, high; Risk Factors include: Age, BMI greater than 25, and length of planned surgery. Recommendation for mechanical prophylaxis. Encourage early ambulation.  ? ?Pictures obtained: '@consult'$  ? ?Post-op Rx sent to pharmacy:  Oxycodone, Zofran, Keflex ? ?Patient was provided with the breast reduction and General Surgical Risk consent document and Pain Medication Agreement prior to their appointment.  They had adequate time to read through the risk consent documents and Pain Medication Agreement. We also discussed them in person together during this preop appointment. All of their questions were answered to their satisfaction.  Recommended calling if they have any  further questions.  Risk consent form and Pain Medication Agreement to be scanned into patient's chart. ? ?The risk that can be encountered with breast reduction were discussed and include the following but

## 2021-04-24 NOTE — H&P (View-Only) (Signed)
? ?  Patient ID: Savannah Taylor, female    DOB: [birth year], 66 y.o.   MRN: [id number] ? ?Chief Complaint  ?Patient presents with  ? Pre-op Exam  ? ? ?  ICD-10-CM   ?1. Chronic bilateral thoracic back pain  M54.6   ? G89.29   ?  ?2. Neck pain  M54.2   ?  ?3. Symptomatic mammary hypertrophy  N62   ?  ? ? ?History of Present Illness: ?Savannah Taylor is a 66 y.o.  female  with a history of macromastia.  She presents for preoperative evaluation for upcoming procedure, Bilateral Breast Reduction , scheduled for 05/10/2021 with Dr.  Marla Roe ? ?The patient has not had problems with anesthesia. No history of DVT/PE.  No family history of DVT/PE.  No family or personal history of bleeding or clotting disorders.  Patient is not currently taking any blood thinners.  No history of MI.  She does have history of a TIA greater than 13 years ago. ? ?Summary of Previous Visit: STN on the right is 36 cm and the left is 40 cm.  She is a 42 double D cup and would like to be a C cup.  She had a mammogram December 2022 and had a clip placed. ? ?She has a lipoma on her back that she would like to be excised at the same time by Dr.Kinsinger ? ?Estimated excess breast tissue to be removed at time of surgery: 630 grams ? ?PMH Significant for: Asthma, GERD, hyperlipidemia, hypertension, seizures with migraines, TIA prior to 2010.  History of OSA, occasionally uses her CPAP.  She feels as if it causes her to be congested. ? ?She reports she has been feeling well lately, no recent changes in her health.  Reports asthma is well controlled.  No recent exacerbations. ? ?She is not exactly sure what size she would like to be after surgery ? ?Past Medical History: ?Allergies: ?Allergies  ?Allergen Reactions  ? Bystolic [Nebivolol Hcl] Palpitations and Other (See Comments)  ? Morphine And Related Itching  ? Naproxen Hives and Itching  ? Sertraline Hcl Other (See Comments)  ?  hallucinations  ? Topiramate Other (See Comments)  ?  Apathetic "out of  it"  ? Venlafaxine Hcl Er Other (See Comments)  ?  Hair loss  ? ? ?Current Medications: ? ?Current Outpatient Medications:  ?  albuterol (PROAIR HFA) 108 (90 Base) MCG/ACT inhaler, Inhale 2 puffs into the lungs every 4 (four) hours as needed for wheezing or shortness of breath., Disp: 1 Inhaler, Rfl: 1 ?  albuterol (PROVENTIL) (2.5 MG/3ML) 0.083% nebulizer solution, Take 2.5 mg by nebulization every 6 (six) hours as needed for wheezing or shortness of breath., Disp: , Rfl:  ?  amLODipine (NORVASC) 5 MG tablet, Take 5 mg by mouth daily., Disp: , Rfl:  ?  cephALEXin (KEFLEX) 500 MG capsule, Take 1 capsule (500 mg total) by mouth 4 (four) times daily for 3 days., Disp: 12 capsule, Rfl: 0 ?  Erenumab-aooe (AIMOVIG) 140 MG/ML SOAJ, Inject 140 mg into the skin every 30 (thirty) days., Disp: 3 mL, Rfl: 3 ?  ezetimibe (ZETIA) 10 MG tablet, Take 10 mg by mouth daily., Disp: , Rfl:  ?  ondansetron (ZOFRAN) 4 MG tablet, Take 1 tablet (4 mg total) by mouth every 8 (eight) hours as needed for nausea or vomiting., Disp: 20 tablet, Rfl: 0 ?  oxyCODONE (OXY IR/ROXICODONE) 5 MG immediate release tablet, Take 1 tablet (5 mg total) by mouth  every 6 (six) hours as needed for up to 5 days for severe pain., Disp: 20 tablet, Rfl: 0 ?  Rimegepant Sulfate (NURTEC) 75 MG TBDP, Take 75 mg by mouth daily as needed (take for abortive therapy of migraine, no more than 1 tablet in 24 hours or 10 per month)., Disp: 8 tablet, Rfl: 11 ? ?Past Medical Problems: ?Past Medical History:  ?Diagnosis Date  ? Abdominal cramps   ? Asthma   ? Back problem   ? BAD DISK IN BACK  ? DDD (degenerative disc disease), lumbosacral   ? Depression   ? Gastritis   ? GERD (gastroesophageal reflux disease)   ? H/O hiatal hernia   ? Heart murmur   ? Years ago  ? Hyperlipidemia   ? Hypertension   ? Insomnia   ? Knee problem   ? Leg cramps   ? Migraine   ? 01/22/2019- last one > 1 month ago  ? Post concussion syndrome   ? Seizure ([place])   ? 1 many years ago with a migraine  ?  Shortness of breath   ? Stroke [facility])   ? TIA  slight memory  issues TIA prior to 2010  ? Ulcer   ? STOMACH & SORENESS & PAIN  ? UTI (urinary tract infection) 07/02/2012  ? ? ?Past Surgical History: ?Past Surgical History:  ?Procedure Laterality Date  ? ABDOMINAL HYSTERECTOMY    ? ARTHROGRAM KNEE Right   ? MCL tear  ? BACK SURGERY    ? PLIF L4-5, 5-S1 Dr. Ronnald Ramp 01-2019  ? COLONOSCOPY    ? ENDOSCOPY  2020  ? hiatal hernia, gastritis, and some bacteria in stomach  ? HEMORRHOID SURGERY    ? ? ?Social History: ?Social History  ? ?Socioeconomic History  ? Marital status: Married  ?  Spouse name: [name]  ? Number of children: 3  ? Years of education: Some college  ? Highest education level: Not on file  ?Occupational History  ? Occupation: works full time  ?Tobacco Use  ? Smoking status: Never  ? Smokeless tobacco: Never  ?Vaping Use  ? Vaping Use: Never used  ?Substance and Sexual Activity  ? Alcohol use: No  ? Drug use: No  ? Sexual activity: Not on file  ?Other Topics Concern  ? Not on file  ?Social History Narrative  ? Lives with husband  ? Caffeine use: Coffee/tea/soda sometimes per patient  ? ?Social Determinants of Health  ? ?Financial Resource Strain: Not on file  ?Food Insecurity: Not on file  ?Transportation Needs: Not on file  ?Physical Activity: Not on file  ?Stress: Not on file  ?Social Connections: Not on file  ?Intimate Partner Violence: Not on file  ? ? ?Family History: ?Family History  ?Problem Relation Age of Onset  ? Hypertension Mother   ? Hyperlipidemia Mother   ? Asthma Father   ? Cancer Father   ? Hypertension Brother   ? Sleep apnea Son   ? Breast cancer Neg Hx   ? ? ?Review of Systems: ?Review of Systems  ?Constitutional: Negative.   ?Cardiovascular: Negative.   ?Gastrointestinal: Negative.   ? ?Physical Exam: ?Vital Signs ?BP (!) 152/96 (BP Location: Left Arm, Patient Position: Sitting, Cuff Size: Normal)   Pulse 84   Ht '5\' 5"'$  (1.651 m)   Wt 191 lb 12.8 oz (87 kg)   SpO2 97%   BMI 31.92 kg/m?   ? ?Physical Exam  ?Constitutional:   ?   General: Not in acute  distress. ?   Appearance: Normal appearance. Not ill-appearing.  ?HENT:  ?   Head: Normocephalic and atraumatic.  ?Eyes:  ?   Pupils: Pupils are equal, round ?Neck:  ?   Musculoskeletal: Normal range of motion.  ?Cardiovascular:  ?   Rate and Rhythm: Normal rate ?   Pulses: Normal pulses.  ?Pulmonary:  ?   Effort: Pulmonary effort is normal. No respiratory distress.  ?Musculoskeletal: Normal range of motion.  ?Skin: ?   General: Skin is warm and dry.  ?   Findings: No erythema or rash.  ?Neurological:  ?   General: No focal deficit present.  ?   Mental Status: Alert and oriented to person, place, and time. Mental status is at baseline.  ?   Motor: No weakness.  ?Psychiatric:     ?   Mood and Affect: Mood normal.     ?   Behavior: Behavior normal.  ? ? ?Assessment/Plan: ?The patient is scheduled for bilateral breast reduction with Dr. Marla Roe.  Risks, benefits, and alternatives of procedure discussed, questions answered and consent obtained.   ? ?Smoking Status: Non-smoker; Counseling Given?  N/A ?Last Mammogram: 01/26/2021; Results: Patient had clip placement and biopsy of the right breast, benign fibroadipose tissue with dystrophic calcifications, no malignancy identified. ? ?Caprini Score: 5, high; Risk Factors include: Age, BMI greater than 25, and length of planned surgery. Recommendation for mechanical prophylaxis. Encourage early ambulation.  ? ?Pictures obtained: '@consult'$  ? ?Post-op Rx sent to pharmacy:  Oxycodone, Zofran, Keflex ? ?Patient was provided with the breast reduction and General Surgical Risk consent document and Pain Medication Agreement prior to their appointment.  They had adequate time to read through the risk consent documents and Pain Medication Agreement. We also discussed them in person together during this preop appointment. All of their questions were answered to their satisfaction.  Recommended calling if they have any  further questions.  Risk consent form and Pain Medication Agreement to be scanned into patient's chart. ? ?The risk that can be encountered with breast reduction were discussed and include the following but

## 2021-04-25 ENCOUNTER — Ambulatory Visit (INDEPENDENT_AMBULATORY_CARE_PROVIDER_SITE_OTHER): Payer: Medicare HMO | Admitting: Surgical

## 2021-04-25 ENCOUNTER — Encounter: Payer: Self-pay | Admitting: Surgical

## 2021-04-25 ENCOUNTER — Telehealth: Payer: Self-pay

## 2021-04-25 VITALS — BP 152/96 | HR 84 | Ht 65.0 in | Wt 191.8 lb

## 2021-04-25 DIAGNOSIS — M542 Cervicalgia: Secondary | ICD-10-CM

## 2021-04-25 DIAGNOSIS — G8929 Other chronic pain: Secondary | ICD-10-CM

## 2021-04-25 DIAGNOSIS — N62 Hypertrophy of breast: Secondary | ICD-10-CM

## 2021-04-25 DIAGNOSIS — M546 Pain in thoracic spine: Secondary | ICD-10-CM

## 2021-04-25 MED ORDER — CEPHALEXIN 500 MG PO CAPS: 500.0000 mg | ORAL_CAPSULE | Freq: Four times a day (QID) | ORAL | 0 refills | Status: AC

## 2021-04-25 MED ORDER — OXYCODONE HCL 5 MG PO TABS: 5.0000 mg | ORAL_TABLET | Freq: Four times a day (QID) | ORAL | 0 refills | Status: AC | PRN

## 2021-04-25 MED ORDER — ONDANSETRON HCL 4 MG PO TABS: 4.0000 mg | ORAL_TABLET | Freq: Three times a day (TID) | ORAL | 0 refills | Status: DC | PRN

## 2021-04-25 NOTE — Telephone Encounter (Signed)
Faxed surgical clearance to Dr. Angelina Pih. SX 05/10/2021 BL breast reduction with possible liposuction.  ?

## 2021-05-02 ENCOUNTER — Encounter (HOSPITAL_BASED_OUTPATIENT_CLINIC_OR_DEPARTMENT_OTHER): Payer: Self-pay | Admitting: Plastic Surgery

## 2021-05-02 ENCOUNTER — Other Ambulatory Visit: Payer: Self-pay

## 2021-05-03 ENCOUNTER — Telehealth: Payer: Self-pay

## 2021-05-03 DIAGNOSIS — Z9013 Acquired absence of bilateral breasts and nipples: Secondary | ICD-10-CM | POA: Diagnosis not present

## 2021-05-03 NOTE — Telephone Encounter (Signed)
Called Dr. Paulino Rily office for status of Surgical Clearance. Fax went through on 04/25/2021. They indicated they did not received it. Refaxed with STAT ?

## 2021-05-04 ENCOUNTER — Telehealth: Payer: Self-pay

## 2021-05-04 NOTE — Telephone Encounter (Signed)
Called Dr. Paulino Rily office 3rd time. No one can find Surgery Clearance Form. Faxed again and put to [facility] attention.  Reminded them the surgery is scheduled on 05/10/2021  ?

## 2021-05-05 ENCOUNTER — Encounter (HOSPITAL_BASED_OUTPATIENT_CLINIC_OR_DEPARTMENT_OTHER)
Admission: RE | Admit: 2021-05-05 | Discharge: 2021-05-05 | Disposition: A | Discharge status: Home / Self Care | Payer: Medicare HMO | Source: Ambulatory Visit | Attending: Plastic Surgery | Admitting: Plastic Surgery

## 2021-05-05 ENCOUNTER — Telehealth: Payer: Self-pay

## 2021-05-05 DIAGNOSIS — Z01810 Encounter for preprocedural cardiovascular examination: Secondary | ICD-10-CM | POA: Diagnosis not present

## 2021-05-05 DIAGNOSIS — R059 Cough, unspecified: Secondary | ICD-10-CM | POA: Diagnosis not present

## 2021-05-05 DIAGNOSIS — J019 Acute sinusitis, unspecified: Secondary | ICD-10-CM | POA: Diagnosis not present

## 2021-05-05 DIAGNOSIS — R0981 Nasal congestion: Secondary | ICD-10-CM | POA: Diagnosis not present

## 2021-05-05 DIAGNOSIS — R1013 Epigastric pain: Secondary | ICD-10-CM | POA: Diagnosis not present

## 2021-05-05 NOTE — Telephone Encounter (Signed)
Called Dr's office again to follow up on surgery clearance. Advised I have sent the fax 3 times. Staff personnel, Willette Cluster stated they have been trying to reach patient to come in for SX clearance. Called patient, stated she has not heard from office until this morning. She has appointment today at 2:00pm ?

## 2021-05-08 ENCOUNTER — Telehealth: Payer: Self-pay

## 2021-05-08 DIAGNOSIS — M546 Pain in thoracic spine: Secondary | ICD-10-CM | POA: Diagnosis not present

## 2021-05-08 DIAGNOSIS — Z01818 Encounter for other preprocedural examination: Secondary | ICD-10-CM | POA: Diagnosis not present

## 2021-05-08 DIAGNOSIS — N62 Hypertrophy of breast: Secondary | ICD-10-CM | POA: Diagnosis not present

## 2021-05-08 DIAGNOSIS — E7800 Pure hypercholesterolemia, unspecified: Secondary | ICD-10-CM | POA: Diagnosis not present

## 2021-05-08 DIAGNOSIS — E669 Obesity, unspecified: Secondary | ICD-10-CM | POA: Diagnosis not present

## 2021-05-08 NOTE — Telephone Encounter (Signed)
Called [facility] office again for the status of SX clearance. Per Manuela Schwartz at [facility], patient is not coming in until 3:45 today and is seeing Windy Carina, NP.  Her nurse has the [facility] clearance on her desk. Explained to front desk staff, pts surgery is 05/10/2021 and we need the form faxed back ASAP. FD staff will remind nurse to fax it ASAP. ?

## 2021-05-09 ENCOUNTER — Encounter: Payer: Self-pay | Admitting: Student

## 2021-05-09 NOTE — Progress Notes (Signed)
Surgical Clearance has been received from Windy Carina FNP for patient's upcoming surgery with Dr. Marla Roe. ? ?Per Windy Carina FNP, medications to hold prior to surgery include Azetimibe, Gabapentin and Senna Docusate.   ? ?Patient medically cleared for surgery by Windy Carina FNP.  ?  ? ?

## 2021-05-10 ENCOUNTER — Ambulatory Visit (HOSPITAL_BASED_OUTPATIENT_CLINIC_OR_DEPARTMENT_OTHER): Payer: Medicare HMO | Admitting: Anesthesiology

## 2021-05-10 ENCOUNTER — Encounter (HOSPITAL_BASED_OUTPATIENT_CLINIC_OR_DEPARTMENT_OTHER)
Admission: RE | Disposition: A | Discharge status: Home / Self Care | Payer: Self-pay | Source: Home / Self Care | Attending: Plastic Surgery

## 2021-05-10 ENCOUNTER — Ambulatory Visit (HOSPITAL_BASED_OUTPATIENT_CLINIC_OR_DEPARTMENT_OTHER)
Admission: RE | Admit: 2021-05-10 | Discharge: 2021-05-10 | Disposition: A | Discharge status: Home / Self Care | Payer: Medicare HMO | Attending: Plastic Surgery | Admitting: Plastic Surgery

## 2021-05-10 ENCOUNTER — Encounter (HOSPITAL_BASED_OUTPATIENT_CLINIC_OR_DEPARTMENT_OTHER): Payer: Self-pay | Admitting: Plastic Surgery

## 2021-05-10 ENCOUNTER — Other Ambulatory Visit: Payer: Self-pay

## 2021-05-10 DIAGNOSIS — G4730 Sleep apnea, unspecified: Secondary | ICD-10-CM

## 2021-05-10 DIAGNOSIS — M542 Cervicalgia: Secondary | ICD-10-CM

## 2021-05-10 DIAGNOSIS — M549 Dorsalgia, unspecified: Secondary | ICD-10-CM

## 2021-05-10 DIAGNOSIS — F32A Depression, unspecified: Secondary | ICD-10-CM | POA: Insufficient documentation

## 2021-05-10 DIAGNOSIS — Z8673 Personal history of transient ischemic attack (TIA), and cerebral infarction without residual deficits: Secondary | ICD-10-CM | POA: Diagnosis not present

## 2021-05-10 DIAGNOSIS — Z79899 Other long term (current) drug therapy: Secondary | ICD-10-CM | POA: Insufficient documentation

## 2021-05-10 DIAGNOSIS — I10 Essential (primary) hypertension: Secondary | ICD-10-CM

## 2021-05-10 DIAGNOSIS — J45909 Unspecified asthma, uncomplicated: Secondary | ICD-10-CM | POA: Insufficient documentation

## 2021-05-10 DIAGNOSIS — M1990 Unspecified osteoarthritis, unspecified site: Secondary | ICD-10-CM | POA: Diagnosis not present

## 2021-05-10 DIAGNOSIS — R69 Illness, unspecified: Secondary | ICD-10-CM | POA: Diagnosis not present

## 2021-05-10 DIAGNOSIS — N62 Hypertrophy of breast: Secondary | ICD-10-CM

## 2021-05-10 DIAGNOSIS — M546 Pain in thoracic spine: Secondary | ICD-10-CM | POA: Diagnosis not present

## 2021-05-10 DIAGNOSIS — K449 Diaphragmatic hernia without obstruction or gangrene: Secondary | ICD-10-CM | POA: Diagnosis not present

## 2021-05-10 HISTORY — PX: BREAST REDUCTION SURGERY: SHX8

## 2021-05-10 HISTORY — DX: Sleep apnea, unspecified: G47.30

## 2021-05-10 SURGERY — MAMMOPLASTY, REDUCTION
Anesthesia: General | Site: Breast | Laterality: Bilateral

## 2021-05-10 MED ORDER — FENTANYL CITRATE (PF) 100 MCG/2ML IJ SOLN
INTRAMUSCULAR | Status: AC
Start: 2021-05-10 — End: ?
  Filled 2021-05-10: qty 2

## 2021-05-10 MED ORDER — SUGAMMADEX SODIUM 200 MG/2ML IV SOLN
INTRAVENOUS | Status: DC | PRN
  Administered 2021-05-10: 200 mg via INTRAVENOUS

## 2021-05-10 MED ORDER — LIDOCAINE-EPINEPHRINE 1 %-1:100000 IJ SOLN
INTRAMUSCULAR | Status: DC | PRN
  Administered 2021-05-10: 50 mL

## 2021-05-10 MED ORDER — PROPOFOL 10 MG/ML IV BOLUS
INTRAVENOUS | Status: AC
  Filled 2021-05-10: qty 20

## 2021-05-10 MED ORDER — ACETAMINOPHEN 325 MG PO TABS: 650.0000 mg | ORAL_TABLET | ORAL | Status: DC | PRN

## 2021-05-10 MED ORDER — CEFAZOLIN SODIUM-DEXTROSE 2-4 GM/100ML-% IV SOLN
2.0000 g | INTRAVENOUS | Status: AC
  Administered 2021-05-10: 2 g via INTRAVENOUS

## 2021-05-10 MED ORDER — LIDOCAINE HCL (PF) 1 % IJ SOLN
INTRAMUSCULAR | Status: AC
  Filled 2021-05-10: qty 30

## 2021-05-10 MED ORDER — ACETAMINOPHEN 325 MG RE SUPP: 650.0000 mg | RECTAL | Status: DC | PRN

## 2021-05-10 MED ORDER — PROPOFOL 10 MG/ML IV BOLUS
INTRAVENOUS | Status: DC | PRN
  Administered 2021-05-10: 100 mg via INTRAVENOUS

## 2021-05-10 MED ORDER — ACETAMINOPHEN 500 MG PO TABS
1000.0000 mg | ORAL_TABLET | Freq: Once | ORAL | Status: AC
  Administered 2021-05-10: 1000 mg via ORAL

## 2021-05-10 MED ORDER — EPHEDRINE SULFATE (PRESSORS) 50 MG/ML IJ SOLN
INTRAMUSCULAR | Status: DC | PRN
  Administered 2021-05-10: 5 mg via INTRAVENOUS
  Administered 2021-05-10: 10 mg via INTRAVENOUS

## 2021-05-10 MED ORDER — SODIUM CHLORIDE 0.9% FLUSH: 3.0000 mL | INTRAVENOUS | Status: DC | PRN

## 2021-05-10 MED ORDER — ROCURONIUM BROMIDE 100 MG/10ML IV SOLN
INTRAVENOUS | Status: DC | PRN
  Administered 2021-05-10: 60 mg via INTRAVENOUS

## 2021-05-10 MED ORDER — MIDAZOLAM HCL 2 MG/2ML IJ SOLN
INTRAMUSCULAR | Status: AC
  Filled 2021-05-10: qty 2

## 2021-05-10 MED ORDER — FENTANYL CITRATE (PF) 100 MCG/2ML IJ SOLN: 25.0000 ug | INTRAMUSCULAR | Status: DC | PRN

## 2021-05-10 MED ORDER — ROCURONIUM BROMIDE 10 MG/ML (PF) SYRINGE
PREFILLED_SYRINGE | INTRAVENOUS | Status: AC
  Filled 2021-05-10: qty 10

## 2021-05-10 MED ORDER — PHENYLEPHRINE 80 MCG/ML (10ML) SYRINGE FOR IV PUSH (FOR BLOOD PRESSURE SUPPORT)
PREFILLED_SYRINGE | INTRAVENOUS | Status: AC
  Filled 2021-05-10: qty 10

## 2021-05-10 MED ORDER — ACETAMINOPHEN 500 MG PO TABS
ORAL_TABLET | ORAL | Status: AC
  Filled 2021-05-10: qty 2

## 2021-05-10 MED ORDER — LIDOCAINE HCL (CARDIAC) PF 100 MG/5ML IV SOSY
PREFILLED_SYRINGE | INTRAVENOUS | Status: DC | PRN
  Administered 2021-05-10: 60 mg via INTRAVENOUS

## 2021-05-10 MED ORDER — SODIUM CHLORIDE 0.9% FLUSH: 3.0000 mL | Freq: Two times a day (BID) | INTRAVENOUS | Status: DC

## 2021-05-10 MED ORDER — DEXAMETHASONE SODIUM PHOSPHATE 10 MG/ML IJ SOLN
INTRAMUSCULAR | Status: AC
  Filled 2021-05-10: qty 1

## 2021-05-10 MED ORDER — CHLORHEXIDINE GLUCONATE CLOTH 2 % EX PADS: 6.0000 | MEDICATED_PAD | Freq: Once | CUTANEOUS | Status: DC

## 2021-05-10 MED ORDER — 0.9 % SODIUM CHLORIDE (POUR BTL) OPTIME
TOPICAL | Status: DC | PRN
  Administered 2021-05-10: 1000 mL

## 2021-05-10 MED ORDER — EPINEPHRINE PF 1 MG/ML IJ SOLN
INTRAMUSCULAR | Status: AC
  Filled 2021-05-10: qty 1

## 2021-05-10 MED ORDER — PHENYLEPHRINE HCL (PRESSORS) 10 MG/ML IV SOLN
INTRAVENOUS | Status: DC | PRN
  Administered 2021-05-10 (×4): 160 ug via INTRAVENOUS

## 2021-05-10 MED ORDER — SODIUM CHLORIDE 0.9 % IV SOLN
250.0000 mL | INTRAVENOUS | Status: DC | PRN
Start: 2021-05-10 — End: 2021-05-10

## 2021-05-10 MED ORDER — CEFAZOLIN SODIUM-DEXTROSE 2-4 GM/100ML-% IV SOLN
INTRAVENOUS | Status: AC
  Filled 2021-05-10: qty 100

## 2021-05-10 MED ORDER — BUPIVACAINE HCL (PF) 0.25 % IJ SOLN
INTRAMUSCULAR | Status: AC
  Filled 2021-05-10: qty 30

## 2021-05-10 MED ORDER — ONDANSETRON HCL 4 MG/2ML IJ SOLN: 4.0000 mg | Freq: Once | INTRAMUSCULAR | Status: DC | PRN

## 2021-05-10 MED ORDER — LIDOCAINE HCL 1 % IJ SOLN
INTRAVENOUS | Status: DC | PRN
  Administered 2021-05-10: 1000 mL

## 2021-05-10 MED ORDER — ONDANSETRON HCL 4 MG/2ML IJ SOLN
INTRAMUSCULAR | Status: DC | PRN
  Administered 2021-05-10: 4 mg via INTRAVENOUS

## 2021-05-10 MED ORDER — MIDAZOLAM HCL 5 MG/5ML IJ SOLN
INTRAMUSCULAR | Status: DC | PRN
  Administered 2021-05-10: 2 mg via INTRAVENOUS

## 2021-05-10 MED ORDER — LACTATED RINGERS IV SOLN: INTRAVENOUS | Status: DC

## 2021-05-10 MED ORDER — OXYCODONE HCL 5 MG PO TABS: 5.0000 mg | ORAL_TABLET | ORAL | Status: DC | PRN

## 2021-05-10 MED ORDER — PROPOFOL 500 MG/50ML IV EMUL
INTRAVENOUS | Status: DC | PRN
  Administered 2021-05-10: 25 ug/kg/min via INTRAVENOUS

## 2021-05-10 MED ORDER — DEXAMETHASONE SODIUM PHOSPHATE 4 MG/ML IJ SOLN
INTRAMUSCULAR | Status: DC | PRN
  Administered 2021-05-10: 10 mg via INTRAVENOUS

## 2021-05-10 MED ORDER — PROPOFOL 500 MG/50ML IV EMUL
INTRAVENOUS | Status: AC
  Filled 2021-05-10: qty 50

## 2021-05-10 MED ORDER — ONDANSETRON HCL 4 MG/2ML IJ SOLN
INTRAMUSCULAR | Status: AC
  Filled 2021-05-10: qty 2

## 2021-05-10 MED ORDER — EPHEDRINE 5 MG/ML INJ
INTRAVENOUS | Status: AC
  Filled 2021-05-10: qty 5

## 2021-05-10 MED ORDER — AMISULPRIDE (ANTIEMETIC) 5 MG/2ML IV SOLN: 10.0000 mg | Freq: Once | INTRAVENOUS | Status: DC | PRN

## 2021-05-10 MED ORDER — FENTANYL CITRATE (PF) 100 MCG/2ML IJ SOLN
INTRAMUSCULAR | Status: DC | PRN
  Administered 2021-05-10 (×2): 50 ug via INTRAVENOUS

## 2021-05-10 MED ORDER — LIDOCAINE 2% (20 MG/ML) 5 ML SYRINGE
INTRAMUSCULAR | Status: AC
  Filled 2021-05-10: qty 5

## 2021-05-10 SURGICAL SUPPLY — 71 items
ADH SKN CLS APL DERMABOND .7 (GAUZE/BANDAGES/DRESSINGS) ×2
BAG DECANTER FOR FLEXI CONT (MISCELLANEOUS) ×2 IMPLANT
BINDER BREAST LRG (GAUZE/BANDAGES/DRESSINGS) IMPLANT
BINDER BREAST MEDIUM (GAUZE/BANDAGES/DRESSINGS) IMPLANT
BINDER BREAST XLRG (GAUZE/BANDAGES/DRESSINGS) IMPLANT
BINDER BREAST XXLRG (GAUZE/BANDAGES/DRESSINGS) ×1 IMPLANT
BIOPATCH RED 1 DISK 7.0 (GAUZE/BANDAGES/DRESSINGS) IMPLANT
BLADE HEX COATED 2.75 (ELECTRODE) ×2 IMPLANT
BLADE KNIFE PERSONA 10 (BLADE) ×6 IMPLANT
BLADE SURG 15 STRL LF DISP TIS (BLADE) IMPLANT
BLADE SURG 15 STRL SS (BLADE) ×2
CANISTER SUCT 1200ML W/VALVE (MISCELLANEOUS) ×3 IMPLANT
COVER BACK TABLE 60X90IN (DRAPES) ×3 IMPLANT
COVER MAYO STAND STRL (DRAPES) ×3 IMPLANT
DERMABOND ADVANCED (GAUZE/BANDAGES/DRESSINGS) ×2
DERMABOND ADVANCED .7 DNX12 (GAUZE/BANDAGES/DRESSINGS) ×4 IMPLANT
DRAIN CHANNEL 19F RND (DRAIN) IMPLANT
DRAPE LAPAROSCOPIC ABDOMINAL (DRAPES) ×3 IMPLANT
DRSG OPSITE POSTOP 4X12 (GAUZE/BANDAGES/DRESSINGS) ×2 IMPLANT
DRSG OPSITE POSTOP 4X6 (GAUZE/BANDAGES/DRESSINGS) ×2 IMPLANT
DRSG PAD ABDOMINAL 8X10 ST (GAUZE/BANDAGES/DRESSINGS) ×6 IMPLANT
ELECT BLADE 4.0 EZ CLEAN MEGAD (MISCELLANEOUS) ×2
ELECT REM PT RETURN 9FT ADLT (ELECTROSURGICAL) ×2
ELECTRODE BLDE 4.0 EZ CLN MEGD (MISCELLANEOUS) IMPLANT
ELECTRODE REM PT RTRN 9FT ADLT (ELECTROSURGICAL) ×2 IMPLANT
EVACUATOR SILICONE 100CC (DRAIN) IMPLANT
GAUZE SPONGE 4X4 12PLY STRL LF (GAUZE/BANDAGES/DRESSINGS) IMPLANT
GLOVE BIO SURGEON STRL SZ 6.5 (GLOVE) ×9 IMPLANT
GLOVE BIOGEL M STRL SZ7.5 (GLOVE) ×2 IMPLANT
GLOVE BIOGEL PI IND STRL 7.0 (GLOVE) IMPLANT
GLOVE BIOGEL PI INDICATOR 7.0 (GLOVE) ×1
GOWN STRL REUS W/ TWL LRG LVL3 (GOWN DISPOSABLE) ×6 IMPLANT
GOWN STRL REUS W/ TWL XL LVL3 (GOWN DISPOSABLE) ×2 IMPLANT
GOWN STRL REUS W/TWL LRG LVL3 (GOWN DISPOSABLE) ×6
GOWN STRL REUS W/TWL XL LVL3 (GOWN DISPOSABLE)
NDL FILTER BLUNT 18X1 1/2 (NEEDLE) ×2 IMPLANT
NDL HYPO 25X1 1.5 SAFETY (NEEDLE) ×2 IMPLANT
NDL SAFETY ECLIPSE 18X1.5 (NEEDLE) IMPLANT
NEEDLE FILTER BLUNT 18X 1/2SAF (NEEDLE) ×1
NEEDLE FILTER BLUNT 18X1 1/2 (NEEDLE) ×1 IMPLANT
NEEDLE HYPO 18GX1.5 SHARP (NEEDLE)
NEEDLE HYPO 25X1 1.5 SAFETY (NEEDLE) ×2 IMPLANT
NS IRRIG 1000ML POUR BTL (IV SOLUTION) ×1 IMPLANT
PACK BASIN DAY SURGERY FS (CUSTOM PROCEDURE TRAY) ×3 IMPLANT
PAD ALCOHOL SWAB (MISCELLANEOUS) ×1 IMPLANT
PAD FOAM SILICONE BACKED (GAUZE/BANDAGES/DRESSINGS) IMPLANT
PENCIL SMOKE EVACUATOR (MISCELLANEOUS) ×3 IMPLANT
PIN SAFETY STERILE (MISCELLANEOUS) IMPLANT
SLEEVE SCD COMPRESS KNEE MED (STOCKING) ×3 IMPLANT
SPIKE FLUID TRANSFER (MISCELLANEOUS) IMPLANT
SPONGE T-LAP 18X18 ~~LOC~~+RFID (SPONGE) ×7 IMPLANT
STRIP SUTURE WOUND CLOSURE 1/2 (MISCELLANEOUS) ×6 IMPLANT
SUT MNCRL AB 4-0 PS2 18 (SUTURE) ×14 IMPLANT
SUT MON AB 3-0 SH 27 (SUTURE) ×12
SUT MON AB 3-0 SH27 (SUTURE) ×8 IMPLANT
SUT MON AB 5-0 PS2 18 (SUTURE) IMPLANT
SUT PDS 3-0 CT2 (SUTURE) ×10
SUT PDS II 3-0 CT2 27 ABS (SUTURE) IMPLANT
SUT SILK 3 0 PS 1 (SUTURE) ×4 IMPLANT
SYR 3ML 23GX1 SAFETY (SYRINGE) ×1 IMPLANT
SYR 50ML LL SCALE MARK (SYRINGE) ×1 IMPLANT
SYR BULB IRRIG 60ML STRL (SYRINGE) ×3 IMPLANT
SYR CONTROL 10ML LL (SYRINGE) ×3 IMPLANT
TAPE MEASURE VINYL STERILE (MISCELLANEOUS) IMPLANT
TOWEL GREEN STERILE FF (TOWEL DISPOSABLE) ×6 IMPLANT
TRAY DSU PREP LF (CUSTOM PROCEDURE TRAY) ×3 IMPLANT
TUBE CONNECTING 20X1/4 (TUBING) ×3 IMPLANT
TUBING INFILTRATION IT-10001 (TUBING) ×1 IMPLANT
TUBING SET GRADUATE ASPIR 12FT (MISCELLANEOUS) ×1 IMPLANT
UNDERPAD 30X36 HEAVY ABSORB (UNDERPADS AND DIAPERS) ×6 IMPLANT
YANKAUER SUCT BULB TIP NO VENT (SUCTIONS) ×3 IMPLANT

## 2021-05-10 NOTE — Op Note (Addendum)
Breast Reduction Op note:  ? ? ?DATE OF PROCEDURE: 05/10/2021 ? ?LOCATION: [facility] ? ?SURGEON: Eleri Ruben Sanger Chantille Navarrete, DO ? ?ASSISTANT: Donnamarie Rossetti, PA ? ?PREOPERATIVE DIAGNOSIS ?1. Macromastia ?2. Neck Pain ?3. Back Pain ? ?POSTOPERATIVE DIAGNOSIS ?1. Macromastia ?2. Neck Pain ?3. Back Pain ? ?PROCEDURES ?1. Bilateral breast reduction.  Right reduction 879 g, Left reduction 816 g ? ?COMPLICATIONS: None. ? ?DRAINS: none ? ?INDICATIONS FOR PROCEDURE ?Savannah Taylor is a 66 y.o. year-old female born on 1955-09-01,with a history of symptomatic macromastia with concominant back pain, neck pain, shoulder grooving from her bra.   ?MRN: [id number] ? ?CONSENT ?Informed consent was obtained directly from the patient. The risks, benefits and alternatives were fully discussed. Specific risks including but not limited to bleeding, infection, hematoma, seroma, scarring, pain, nipple necrosis, asymmetry, poor cosmetic results, and need for further surgery were discussed. The patient had ample opportunity to have her questions answered to her satisfaction. ? ?DESCRIPTION OF PROCEDURE ? ?Patient was brought into the operating room and placed in a supine position.  SCDs were placed and appropriate padding was performed.  Antibiotics were given. The patient underwent general anesthesia and the chest was prepped and draped in a sterile fashion.  A timeout was performed and all information was confirmed to be correct. Tumescent was placed laterally and then liposuction was done to help with contour. ? ?Right side: Preoperative markings were confirmed.  Incision lines were injected with local with epinephrine.  After waiting for vasoconstriction, the marked lines were incised.  A Wise-pattern superomedial breast reduction was performed by de-epithelializing the pedicle, using bovie to create the superomedial pedicle, and removing breast tissue from the superior, lateral, and inferior portions of the  breast.  Care was taken to not undermine the breast pedicle. Hemostasis was achieved.  The nipple was gently rotated into position and the soft tissue closed with 4-0 Monocryl.   The pocket was irrigated and hemostasis confirmed.  The deep tissues were approximated with 3-0 PDS and 3-0 Monocryl sutures and the skin was closed with deep dermal and subcuticular 4-0 Monocryl sutures.  The nipple and skin flaps had good capillary refill at the end of the procedure.   ? ?Left side: Preoperative markings were confirmed.  Incision lines were injected with local with epinephrine.  After waiting for vasoconstriction, the marked lines were incised.  A Wise-pattern superomedial breast reduction was performed by de-epithelializing the pedicle, using bovie to create the superomedial pedicle, and removing breast tissue from the superior, lateral, and inferior portions of the breast.  Care was taken to not undermine the breast pedicle. Hemostasis was achieved.  The nipple was gently rotated into position and the soft tissue was closed with 4-0 Monocryl.  The patient was sat upright and size and shape symmetry was confirmed.  The pocket was irrigated and hemostasis confirmed.  The deep tissues were approximated with 3-0 PDS and 3-0 Monocryl sutures and the skin was closed with deep dermal and subcuticular 4-0 Monocryl sutures.  Dermabond was applied.  A breast binder and ABDs were placed.  The nipple and skin flaps had good capillary refill at the end of the procedure.  The patient tolerated the procedure well. The patient was allowed to wake from anesthesia and taken to the recovery room in satisfactory condition. ? ?The advanced practice practitioner (APP) assisted throughout the case.  The APP was essential in retraction and counter traction when needed to make the case progress smoothly.  This retraction and assistance  made it possible to see the tissue plans for the procedure.  The assistance was needed for blood control,  tissue re-approximation and assisted with closure of the incision site. ? ?

## 2021-05-10 NOTE — Anesthesia Preprocedure Evaluation (Addendum)
Anesthesia Evaluation  ?Patient identified by MRN, date of birth, ID band ?Patient awake ? ? ? ?Reviewed: ?Allergy & Precautions, NPO status , Patient's Chart, lab work & pertinent test results ? ?Airway ?Mallampati: III ? ?TM Distance: >3 FB ?Neck ROM: Full ? ? ? Dental ?no notable dental hx. ? ?  ?Pulmonary ?asthma , sleep apnea ,  ?  ?Pulmonary exam normal ?breath sounds clear to auscultation ? ? ? ? ? ? Cardiovascular ?hypertension, Pt. on medications ?Normal cardiovascular exam ?Rhythm:Regular Rate:Normal ? ? ?  ?Neuro/Psych ? Headaches, Seizures -, Well Controlled,  PSYCHIATRIC DISORDERS Depression CVA, No Residual Symptoms   ? GI/Hepatic ?Neg liver ROS, hiatal hernia,   ?Endo/Other  ?negative endocrine ROS ? Renal/GU ?Renal disease  ? ?  ?Musculoskeletal ? ?(+) Arthritis ,  ? Abdominal ?(+) + obese,   ?Peds ? Hematology ?negative hematology ROS ?(+)   ?Anesthesia Other Findings ?Symptomatic mammary hypertrophy ? Reproductive/Obstetrics ? ?  ? ? ? ? ? ? ? ? ? ? ? ? ? ?  ?  ? ? ? ? ? ? ? ?Anesthesia Physical ?Anesthesia Plan ? ?ASA: 3 ? ?Anesthesia Plan: General  ? ?Post-op Pain Management:   ? ?Induction: Intravenous ? ?PONV Risk Score and Plan: 3 and Ondansetron, Dexamethasone, Midazolam and Treatment may vary due to age or medical condition ? ?Airway Management Planned: Oral ETT ? ?Additional Equipment:  ? ?Intra-op Plan:  ? ?Post-operative Plan: Extubation in OR ? ?Informed Consent: I have reviewed the patients History and Physical, chart, labs and discussed the procedure including the risks, benefits and alternatives for the proposed anesthesia with the patient or authorized representative who has indicated his/her understanding and acceptance.  ? ? ? ?Dental advisory given ? ?Plan Discussed with: CRNA ? ?Anesthesia Plan Comments:   ? ? ? ? ? ?Anesthesia Quick Evaluation ? ?

## 2021-05-10 NOTE — Interval H&P Note (Signed)
History and Physical Interval Note: ? ?05/10/2021 ?11:36 AM ? ?Savannah Taylor  has presented today for surgery, with the diagnosis of Symptomatic mammary hypertrophy.  The various methods of treatment have been discussed with the patient and family. After consideration of risks, benefits and other options for treatment, the patient has consented to  Procedure(s) with comments: ?MAMMARY REDUCTION  (BREAST) (Bilateral) - 3 hours as a surgical intervention.  The patient's history has been reviewed, patient examined, no change in status, stable for surgery.  I have reviewed the patient's chart and labs.  Questions were answered to the patient's satisfaction.   ? ? ?[name] ? ? ?

## 2021-05-10 NOTE — Anesthesia Postprocedure Evaluation (Signed)
Anesthesia Post Note ? ?Patient: Savannah Taylor ? ?Procedure(s) Performed: MAMMARY REDUCTION  (BREAST) (Bilateral: Breast) ? ?  ? ?Patient location during evaluation: PACU ?Anesthesia Type: General ?Level of consciousness: awake ?Pain management: pain level controlled ?Vital Signs Assessment: post-procedure vital signs reviewed and stable ?Respiratory status: spontaneous breathing, nonlabored ventilation, respiratory function stable and patient connected to nasal cannula oxygen ?Cardiovascular status: blood pressure returned to baseline and stable ?Postop Assessment: no apparent nausea or vomiting ?Anesthetic complications: no ? ? ?No notable events documented. ? ?Last Vitals:  ?Vitals:  ? 05/10/21 1515 05/10/21 1530  ?BP: 113/62 109/75  ?Pulse: 78 66  ?Resp: 13 17  ?Temp:    ?SpO2: 94% 93%  ?  ?Last Pain:  ?Vitals:  ? 05/10/21 1515  ?TempSrc:   ?PainSc: 0-No pain  ? ? ?  ?  ?  ?  ?  ?  ? ?Devontay Celaya P Madelein Mahadeo ? ? ? ? ?

## 2021-05-10 NOTE — Anesthesia Procedure Notes (Signed)
Procedure Name: Intubation ?Date/Time: 05/10/2021 12:19 PM ?Performed by: Glory Buff, CRNA ?Pre-anesthesia Checklist: Patient identified, Emergency Drugs available, Suction available and Patient being monitored ?Patient Re-evaluated:Patient Re-evaluated prior to induction ?Oxygen Delivery Method: Circle system utilized ?Preoxygenation: Pre-oxygenation with 100% oxygen ?Induction Type: IV induction ?Ventilation: Mask ventilation without difficulty ?Laryngoscope Size: Sabra Heck and 3 ?Grade View: Grade I ?Tube type: Oral ?Tube size: 7.0 mm ?Number of attempts: 1 ?Airway Equipment and Method: Stylet and Oral airway ?Placement Confirmation: ETT inserted through vocal cords under direct vision, positive ETCO2 and breath sounds checked- equal and bilateral ?Secured at: 20 cm ?Tube secured with: Tape ?Dental Injury: Teeth and Oropharynx as per pre-operative assessment  ? ? ? ? ?

## 2021-05-10 NOTE — Transfer of Care (Signed)
Immediate Anesthesia Transfer of Care Note ? ?Patient: Savannah Taylor ? ?Procedure(s) Performed: MAMMARY REDUCTION  (BREAST) (Bilateral: Breast) ? ?Patient Location: PACU ? ?Anesthesia Type:General ? ?Level of Consciousness: drowsy and patient cooperative ? ?Airway & Oxygen Therapy: Patient Spontanous Breathing and Patient connected to face mask oxygen ? ?Post-op Assessment: Report given to RN and Post -op Vital signs reviewed and stable ? ?Post vital signs: Reviewed and stable ? ?Last Vitals:  ?Vitals Value Taken Time  ?BP    ?Temp    ?Pulse 73 05/10/21 1500  ?Resp 18 05/10/21 1500  ?SpO2 98 % 05/10/21 1500  ?Vitals shown include unvalidated device data. ? ?Last Pain:  ?Vitals:  ? 05/10/21 1034  ?TempSrc: Oral  ?PainSc: 0-No pain  ?   ? ?Patients Stated Pain Goal: 3 (05/10/21 1034) ? ?Complications: No notable events documented. ?

## 2021-05-10 NOTE — Discharge Instructions (Addendum)
INSTRUCTIONS FOR AFTER BREAST SURGERY   You will likely have some questions about what to expect following your operation.  The following information will help you and your family understand what to expect when you are discharged from the hospital.  Following these guidelines will help ensure a smooth recovery and reduce risks of complications.  Postoperative instructions include information on: diet, wound care, medications and physical activity.  AFTER SURGERY Expect to go home after the procedure.  In some cases, you may need to spend one night in the hospital for observation.  DIET Breast surgery does not require a specific diet.  However, the healthier you eat the better your body can start healing. It is important to increasing your protein intake.  This means limiting the foods with sugar and carbohydrates.  Focus on vegetables and some meat.  If you have any liposuction during your procedure be sure to drink water.  If your urine is bright yellow, then it is concentrated, and you need to drink more water.  As a general rule after surgery, you should have 8 ounces of water every hour while awake.  If you find you are persistently nauseated or unable to take in liquids let us know.  NO TOBACCO USE or EXPOSURE.  This will slow your healing process and increase the risk of a wound.  WOUND CARE Leave the ACE wrap or binder on for 3 days . Use fragrance free soap.   After 3 days you can remove the ACE wrap or binder to shower. Once dry apply ACE wrap, binder or sports bra.  Use a mild soap like Dial, Dove and Ivory. You may have Topifoam or Lipofoam on.  It is soft and spongy and helps keep you from getting creases if you have liposuction.  This can be removed before the shower and then replaced.  If you need more it is available on [employer] (Lipofoam). If you have steri-strips / tape directly attached to your skin leave them in place. It is OK to get these wet.   No baths, pools or hot tubs for four  weeks. We close your incision to leave the smallest and best-looking scar. No ointment or creams on your incisions until given the go ahead.  Especially not Neosporin (Too many skin reactions with this one).  A few weeks after surgery you can use Mederma and start massaging the scar. We ask you to wear your binder or sports bra for the first 6 weeks around the clock, including while sleeping. This provides added comfort and helps reduce the fluid accumulation at the surgery site.  ACTIVITY No heavy lifting until cleared by the doctor.  This usually means no more than a half-gallon of milk.  It is OK to walk and climb stairs. In fact, moving your legs is very important to decrease your risk of a blood clot.  It will also help keep you from getting deconditioned.  Every 1 to 2 hours get up and walk for 5 minutes. This will help with a quicker recovery back to normal.  Let pain be your guide so you don't do too much.  This is not the time for spring cleaning and don't plan on taking care of anyone else.  This time is for you to recover,  You will be more comfortable if you sleep and rest with your head elevated either with a few pillows under you or in a recliner.  No stomach sleeping for a three months.  WORK Everyone   returns to work at different times. As a rough guide, most people take at least 1 - 2 weeks off prior to returning to work. If you need documentation for your job, bring the forms to your postoperative follow up visit. ? ?DRIVING ?Arrange for someone to bring you home from the hospital.  You may be able to drive a few days after surgery but not while taking any narcotics or valium. ? ?BOWEL MOVEMENTS ?Constipation can occur after anesthesia and while taking pain medication.  It is important to stay ahead for your comfort.  We recommend taking Milk of Magnesia (2 tablespoons; twice a day) while taking the pain pills. ? ?MEDICATIONS You may be prescribed should start after surgery ?At your  preoperative visit for you history and physical you may have been given the following medications: ?An antibiotic: Start this medication when you get home and take according to the instructions on the bottle. ?Zofran 4 mg:  This is to treat nausea and vomiting.  You can take this every 6 hours as needed and only if needed. ?Valium 2 mg: This is for muscle tightness if you have an implant or expander. This will help relax your muscle which also helps with pain control.  This can be taken every 12 hours as needed. Don't drive after taking this medication. ?Norco (hydrocodone/acetaminophen) 5/325 mg:  This is only to be used after you have taken the motrin or the tylenol. Every 8 hours as needed. ? ? ?Over the counter Medication to take: ?Ibuprofen (Motrin) 600 mg:  Take this every 6 hours.  If you have additional pain then take 500 mg of the tylenol every 8 hours.  Only take the Norco after you have tried these two. ?Miralax or stool softener of choice: Take this according to the bottle if you take the Norco. ? ?WHEN TO CALL ?Call your surgeon's office if any of the following occur: ?Fever 101 degrees F or greater ?Excessive bleeding or fluid from the incision site. ?Pain that increases over time without aid from the medications ?Redness, warmth, or pus draining from incision sites ?Persistent nausea or inability to take in liquids ?Severe misshapen area that underwent the operation. ? ? ?Post Anesthesia Home Care Instructions ? ?Activity: ?Get plenty of rest for the remainder of the day. A responsible individual must stay with you for 24 hours following the procedure.  ?For the next 24 hours, DO NOT: ?-Drive a car ?-Paediatric nurse ?-Drink alcoholic beverages ?-Take any medication unless instructed by your physician ?-Make any legal decisions or sign important papers. ? ?Meals: ?Start with liquid foods such as gelatin or soup. Progress to regular foods as tolerated. Avoid greasy, spicy, heavy foods. If nausea  and/or vomiting occur, drink only clear liquids until the nausea and/or vomiting subsides. Call your physician if vomiting continues. ? ?Special Instructions/Symptoms: ?Your throat may feel dry or sore from the anesthesia or the breathing tube placed in your throat during surgery. If this causes discomfort, gargle with warm salt water. The discomfort should disappear within 24 hours. ? ?If you had a scopolamine patch placed behind your ear for the management of post- operative nausea and/or vomiting: ? ?1. The medication in the patch is effective for 72 hours, after which it should be removed.  Wrap patch in a tissue and discard in the trash. Wash hands thoroughly with soap and water. ?2. You may remove the patch earlier than 72 hours if you experience unpleasant side effects which may include dry mouth, dizziness  or visual disturbances. ?3. Avoid touching the patch. Wash your hands with soap and water after contact with the patch. ?    ? ?*You had 1000 mg of Tylenol at 10:45 am ?

## 2021-05-11 ENCOUNTER — Encounter (HOSPITAL_BASED_OUTPATIENT_CLINIC_OR_DEPARTMENT_OTHER): Payer: Self-pay | Admitting: Plastic Surgery

## 2021-05-11 LAB — SURGICAL PATHOLOGY

## 2021-05-16 ENCOUNTER — Encounter: Payer: Self-pay | Admitting: Plastic Surgery

## 2021-05-16 ENCOUNTER — Telehealth: Payer: Self-pay | Admitting: Student

## 2021-05-16 ENCOUNTER — Telehealth: Payer: Self-pay

## 2021-05-16 NOTE — Telephone Encounter (Signed)
Pt called in stating she had a low-grade fever of 99.7 earlier this week and today it is 99.2. She is taking Tylenol. No bleeding, redness or warmth but is having chills. She wanted to know if she needed to be seen.  ? ?Also, the compression bra she purchaced from [employer] is riding up and sitting at the incision line. She has thrown away her surgical binder. I said she could call [facility] to see if she can try a different size or if they had suggestions, but I also wanted to let you know just in case you had suggestions.  ?

## 2021-05-16 NOTE — Telephone Encounter (Signed)
I called the patient and spoke with her on the phone. Thank you! ?

## 2021-05-16 NOTE — Telephone Encounter (Signed)
Called patient to check in on how she was doing. She reports she has been having some low-grade fevers (99.7 and 99.2) which she has been taking tylenol for. She reports the tylenol has been helping. Patient states she has been up and walking around. She denies shortness of breath or difficulty breathing at rest. She states she gets mildly winded after climbing the stairs. She denies any swelling in her legs. Discussed with patient that she should continue to monitor her fevers.  ? ?Discussed that if her fevers were to get to 101 or above that she should go to the emergency room or contact us. Also discussed that if she becomes short of breath or has difficulty breathing at rest or symptoms that worsen, she should go to the emergency room.  ? ?Patient also mentioned her breasts feel slightly swollen bilaterally. She says they are fairly symmetrical. She denies one breast being significantly larger than another. Discussed with her to continue to monitor and to call us if one does become significant larger.  ? ?Patient was also inquiring about her compressive sports bra. She said she loosened the straps and it feels a little better. I told her to make sure they are not too loose and that the bra still needs to provide some compression. She acknowledged. I also told her she can find other compressive sports bras at [facility] if she needs another one.  ? ?Told patient that if she has any concerns or questions to give [place] a call.  ? ?Patient to follow up at her appointment scheduled for Thursday.  ? ? ?

## 2021-05-17 ENCOUNTER — Telehealth: Payer: Self-pay | Admitting: Student

## 2021-05-17 NOTE — Telephone Encounter (Signed)
Checked in with patient today. She reports she is doing alright. Patient states she still has some episodes of chills but has not taken her temperature this morning and has not taken tylenol this morning either.  ? ?Today she denies SOB, difficulty breathing, leg swelling or leg tenderness. She states she feels a little weak. I asked her if she has been eating and she says that she has not been eating much due to decrease in her taste from a sinus infection she had prior to surgery. She states she feels she is still getting over the sinus infection. ? ?I encouraged the patient to make sure she is eating and that she is eating a high-protein, low-carb diet. I told her the importance of nutrition for her recovery and wound healing. Patient acknowledged.  ? ?I also encouraged the patient to make sure she is up and ambulating. She reports she has been. Discussed with patient that if she experiences any leg swelling, tenderness or erythema, or SOB or difficulty breathing that she needs to go to the emergency room. Patient understood.  ? ?Also instructed patient to call us or go to the emergency room if she is having fevers greater than 101. Patient acknowledged.  ? ?I told the patient to call us if she has any questions or concerns. Patient scheduled for appointment tomorrow.  ?

## 2021-05-18 ENCOUNTER — Ambulatory Visit (INDEPENDENT_AMBULATORY_CARE_PROVIDER_SITE_OTHER): Payer: Medicare HMO | Admitting: Surgical

## 2021-05-18 ENCOUNTER — Ambulatory Visit: Payer: Medicare HMO | Admitting: Adult Health

## 2021-05-18 DIAGNOSIS — M542 Cervicalgia: Secondary | ICD-10-CM

## 2021-05-18 DIAGNOSIS — G8929 Other chronic pain: Secondary | ICD-10-CM

## 2021-05-18 DIAGNOSIS — N62 Hypertrophy of breast: Secondary | ICD-10-CM

## 2021-05-18 DIAGNOSIS — M546 Pain in thoracic spine: Secondary | ICD-10-CM

## 2021-05-18 NOTE — Progress Notes (Signed)
Patient is a 66 year old female here for follow-up after bilateral breast reduction with Dr. Marla Roe on 05/10/2021.  She reports overall she is doing well, has some concerns about temperatures in the 99 range and some shocklike sensations around her nipple areola.  Otherwise she feels as if things are going well.  Chaperone present on exam On exam bilateral NAC's are viable, bilateral breast incisions that are visible are intact, Steri-Strips and honeycomb dressing in place.  No subcutaneous fluid collections noted with palpation.  No erythema or cellulitic changes noted.  She does have some areas of firmness within bilateral breast, suspect this is swelling of the breast tissue versus some mild fat necrosis.  Recommend continue with compressive garments, no signs of infection on exam. Recommend following up in a few weeks for further evaluation with Dr. Marla Roe. Call with questions or concerns.

## 2021-05-18 NOTE — Addendum Note (Signed)
Addended by: Lindon Romp on: 05/18/2021 01:37 PM   Modules accepted: Orders

## 2021-05-19 ENCOUNTER — Encounter: Payer: Medicare HMO | Admitting: Student

## 2021-05-19 ENCOUNTER — Encounter: Payer: Medicare HMO | Admitting: Plastic Surgery

## 2021-05-22 ENCOUNTER — Telehealth: Payer: Self-pay | Admitting: *Deleted

## 2021-05-22 ENCOUNTER — Encounter: Payer: Medicare HMO | Admitting: Plastic Surgery

## 2021-05-22 DIAGNOSIS — G4733 Obstructive sleep apnea (adult) (pediatric): Secondary | ICD-10-CM | POA: Diagnosis not present

## 2021-05-22 NOTE — Telephone Encounter (Signed)
Pt called with concerns that her tape fell off while showering but steri strips were still in place; also "a piece of skin came off" her left breast areola and her side is sore under right arm, but not near incision. Advised to apply vaseline to areola per Dr. Marla Roe and to keep scheduled f/u on 6/2 unless she has further concerns with the area.

## 2021-05-23 DIAGNOSIS — R109 Unspecified abdominal pain: Secondary | ICD-10-CM | POA: Diagnosis not present

## 2021-05-23 DIAGNOSIS — R1013 Epigastric pain: Secondary | ICD-10-CM | POA: Diagnosis not present

## 2021-05-31 ENCOUNTER — Ambulatory Visit (INDEPENDENT_AMBULATORY_CARE_PROVIDER_SITE_OTHER): Payer: Medicare HMO | Admitting: Plastic Surgery

## 2021-05-31 DIAGNOSIS — N62 Hypertrophy of breast: Secondary | ICD-10-CM

## 2021-05-31 NOTE — Progress Notes (Unsigned)
The patient is a 67 year old female here for follow-up after undergoing bilateral breast reduction.  Overall she is doing really well.  She has a little bit of swelling on the lateral aspect which may have a little bit fluid accumulation with a seroma but nothing believe needs drained at this time.  Her skin is nice and soft, her incisions are intact.  The right inframammary fold at the T-junction is slightly open so she needs to put Vaseline on that 1-2 times a day.  Keep the Steri-Strips in place.  Follow-up in 2 weeks.  We will get a picture at that time.

## 2021-06-01 ENCOUNTER — Telehealth: Payer: Self-pay | Admitting: *Deleted

## 2021-06-01 NOTE — Telephone Encounter (Signed)
Received on (05/03/21) via of fax DME Standard Written Order from [facility] [employer].  Requesting signature and return.   Given to provider to sign.  DME Standard Written Order signed and faxed back to [facility] [place].  Confirmation received and copy scanned into the chart.//AB/CMA

## 2021-06-02 ENCOUNTER — Encounter: Payer: Medicare HMO | Admitting: Plastic Surgery

## 2021-06-02 ENCOUNTER — Encounter: Payer: Medicare HMO | Admitting: Physician Assistant

## 2021-06-05 ENCOUNTER — Telehealth: Payer: Self-pay | Admitting: *Deleted

## 2021-06-05 NOTE — Telephone Encounter (Signed)
Call received from pt with c/o increased swelling under both arms, worse on right, since appt on 5/31. She is s/p breast reduction surgery 5/10. She c/o increased pain and soreness on right side. Advised pt to make appt to be evaluated and call transferred to front desk.

## 2021-06-06 ENCOUNTER — Ambulatory Visit (INDEPENDENT_AMBULATORY_CARE_PROVIDER_SITE_OTHER): Payer: Medicare HMO | Admitting: Student

## 2021-06-06 DIAGNOSIS — N62 Hypertrophy of breast: Secondary | ICD-10-CM

## 2021-06-06 NOTE — Progress Notes (Signed)
Patient is a 66 year old female who underwent bilateral breast reduction with Dr. Marla Roe on 05/10/2021.  She is here today for postoperative follow-up.  Patient was last seen in the clinic on 05/31/2021.  At this visit, she was doing really well.  She was found to have a little bit of swelling on the lateral aspect which may have been a little bit of fluid accumulation but nothing needed to be drained at that time.  She did have a small wound at the right inframammary fold at the T-junction.  Today, patient reports she is doing fairly well.  She states that since her last visit, she feels the fluid collection in her axilla areas bilaterally has gotten slightly bigger especially on the right side.  She states that this has been bothering her.  She states that pain is a dull soreness.  Patient also reports she has burning sensations intermittently in her breasts bilaterally.  Patient denies any fevers or chills.  She denies any erythema at the incision sites.  Chaperone present on exam.  On exam, patient is sitting upright in no acute distress.  There appears to be small fluid collections on the lateral aspects of the breasts bilaterally, with the right being larger than the left.  There appears to be some dryness around the NACs bilaterally.  NACs appear viable bilaterally.  Vertical incisions are intact with Steri-Strips bilaterally.  Inframammary incision on the left side is intact.  There were a few Monocryl suture knots that were poking out and removed at this visit.  There is a small superficial wound at the inferior T-junction on the right breast.  Otherwise incision is intact and healing well.   Dr. Marla Roe was consulted and present during this visit for drainage fluid collection on the right lateral breast.  Area was cleaned with alcohol wipe.  Butterfly needle was used to aspirate 80 cc of serosanguineous drainage patient tolerated well. Attempted to drain the left side using the same technique  but no fluid was aspirated.   Discussed with patient that she may put a little bit of Vaseline daily over her NACs bilaterally.  Discussed that she should also put Vaseline over her small inframammary wound on right breast daily.  Discussed with patient that burning sensation at this time postoperatively is still normal given the nature of the surgery and to continue to monitor it.  Discussed with patient that she should avoid lifting heavy objects over 10 to 15 pounds.  Discussed with patient that she should continue compressive bras 24/7.  Patient acknowledged.  Patient to call with any questions or concerns.  Patient to follow-up next week.  Patient was also examined by Dr. Marla Roe.

## 2021-06-12 DIAGNOSIS — I10 Essential (primary) hypertension: Secondary | ICD-10-CM | POA: Diagnosis not present

## 2021-06-12 DIAGNOSIS — Z0000 Encounter for general adult medical examination without abnormal findings: Secondary | ICD-10-CM | POA: Diagnosis not present

## 2021-06-12 DIAGNOSIS — Z23 Encounter for immunization: Secondary | ICD-10-CM | POA: Diagnosis not present

## 2021-06-12 DIAGNOSIS — E785 Hyperlipidemia, unspecified: Secondary | ICD-10-CM | POA: Diagnosis not present

## 2021-06-12 DIAGNOSIS — R1013 Epigastric pain: Secondary | ICD-10-CM | POA: Diagnosis not present

## 2021-06-12 NOTE — Progress Notes (Signed)
Patient is 66 year old female who underwent bilateral breast reduction with Dr. Marla Roe on 05/10/2021.  Patient was last seen in the clinic on 06/06/2021.  At this visit, she was doing well.  She did report some fullness in her axilla bilaterally especially on the right side.  On exam, there were no signs of infection.  There was a fluid collection palpated to the right side.  80 cc of serosanguineous drainage was aspirated from the patient's right axilla.  Today, patient is here for follow-up.  She states she is doing well and feeling better than last week.  Patient states the discomfort in her axilla bilaterally has much improved.  She does state that there is some soreness there occasionally though.  She denies any fevers or chills.  She denies any erythema or drainage from the incisions.  She states she has been putting Vaseline on her incision sites.    Chaperone present on exam.  On exam, patient is sitting upright in no acute distress.  Breasts are fairly symmetrical.  There are some areas of firmness consistent with fat necrosis in the breast bilaterally.  Otherwise the breasts are soft.  There are the NAC's are viable bilaterally.  Incisions are healing well for the most part.  There is a small wound at the inferior T-junction of the right breast. No erythema or drainage from the incision sites. This small wound has improved from the last visit and has been healing well.  There is some swelling to the breasts bilaterally.  No significant fluid collections palpated.   Discussed with patient that she should continue compression with a sports bra.  Discussed that she may buy a sports bra with a zipper in front from [employer] or another store if she would like, as long the sports bra had adequate compression.  Discussed with patient to continue Vaseline to the wound to her right breast.  Discussed that she should gently massage the areas of firmness.  Discussed with the patient that she may use scar  creams around the NAC's bilaterally in a vertical incisions. Discussed she may use tylenol for soreness to axilla.   Patient to follow-up in 2 to 3 weeks.  Pictures were obtained and placed in the patient's chart with the patient's permission.

## 2021-06-13 ENCOUNTER — Ambulatory Visit (INDEPENDENT_AMBULATORY_CARE_PROVIDER_SITE_OTHER): Payer: Medicare HMO | Admitting: Student

## 2021-06-13 DIAGNOSIS — N62 Hypertrophy of breast: Secondary | ICD-10-CM

## 2021-06-26 NOTE — Progress Notes (Signed)
Patient is a 66 year old female who underwent bilateral breast reduction with Dr. Marla Roe on 05/10/21. Patient presents today for follow up.   Patient was last seen in the clinic on 06/13/21. At this visit she reported she was doing much better. On exam, there was some fat necrosis noted. NACs were viable bilaterally. Incisions were healing well, although there was a small wound at the T junction of the right breast which had improved from previous visits. No significant fluid collections noted to the axilla.   Today, patient reports she is doing well.  She reports she does sometimes have some aching in the axilla.  She also reports the area of firmness to her left breast is still present, but has gone down a little bit after gently massaging.  She denies any other issues at this time.  She denies any fevers or chills.  She denies any issues at the surgical sites.  Chaperone present on exam.  On exam, patient is sitting upright in no acute distress.  Breasts are fairly symmetrical.  Incisions are healed.  Wound that was at the inferior T-junction of the right breast is now healed.  There are some areas of firmness in the breast bilaterally that appear to be consistent with fat necrosis.   Discussed with patient that she should continue to gently massage the firm areas.  Discussed with patient that if these do not go away after several months that she should follow-up with [place].  Discussed with patient that she may start using scar creams to the incisions.  Discussed with patient that she may transition into a regular bra that does not have underwire.  Patient to follow-up as needed.

## 2021-06-27 ENCOUNTER — Ambulatory Visit (INDEPENDENT_AMBULATORY_CARE_PROVIDER_SITE_OTHER): Payer: Medicare HMO | Admitting: Student

## 2021-06-27 DIAGNOSIS — N62 Hypertrophy of breast: Secondary | ICD-10-CM

## 2021-06-28 ENCOUNTER — Telehealth: Payer: Self-pay | Admitting: Neurology

## 2021-06-28 NOTE — Telephone Encounter (Signed)
Noted patient will be evaluated tomorrow

## 2021-06-28 NOTE — Telephone Encounter (Signed)
Pt having constance migraines for 3 weeks. Have taken Nurtec, Tylenol, BC powder and did not stop the migraine. Scheduled appt with Amy on 06/29/21

## 2021-06-29 ENCOUNTER — Ambulatory Visit: Payer: Medicare HMO | Admitting: Family Medicine

## 2021-06-29 ENCOUNTER — Encounter: Payer: Self-pay | Admitting: Family Medicine

## 2021-06-29 VITALS — BP 174/98 | HR 69 | Ht 65.0 in | Wt 193.5 lb

## 2021-06-29 DIAGNOSIS — G43719 Chronic migraine without aura, intractable, without status migrainosus: Secondary | ICD-10-CM

## 2021-06-29 MED ORDER — NURTEC 75 MG PO TBDP: 75.0000 mg | ORAL_TABLET | Freq: Every day | ORAL | 11 refills | Status: DC | PRN

## 2021-06-29 MED ORDER — AJOVY 225 MG/1.5ML ~~LOC~~ SOAJ: 225.0000 mg | SUBCUTANEOUS | 3 refills | Status: DC

## 2021-06-29 NOTE — Progress Notes (Signed)
PATIENT: Savannah Taylor DOB: 06-04-1955  REASON FOR VISIT: follow up HISTORY FROM: patient  Chief Complaint  Patient presents with   Follow-up    Rm 2, alone. Pt having migraines constantly for 3 weeks. Has a HA today. Nothing seems to work.      HISTORY OF PRESENT ILLNESS:  06/29/21 ALL: Savannah Taylor returns for follow up. She was last seen 01/2020. She was educated on the importance of consistency with CGRP injections and advised to consider sleep study. Since, she reports headaches were Taylor managed. She has continued Amovig consistently every month. She has had a milder nagging headache for the past three weeks. Nurtec is not helping. She has been monitoring her BP at home. Readings having been 140's-150's/80-90. She was started on losartan '50mg'$  QD about 2 weeks ago.   Medications tried and failed: Amovig, Topamax, verapamil, amitriptyline, Depakote, Inderal, Effexor, Cymbalta, Nurtec, Ubrelvy, Imitrex injections, Maxalt, Neurontin and Botox.   Exp 11/2024 Lot [id number]  01/07/2020 ALL:  She returns today for migraine follow up. She was not taking Amovig consistently at last visit. She was reeducated on dosing and advised to take 1 injection every month. She took injections Oct, Nov and Dec. She does feel that [place] works to help reduce frequency and intensity of migraines for about 2 weeks. Week 3 and 4 she has more headaches. We also started Nurtec for abortive therapy. She reports that it works Taylor to decrease intensity but does not completely relieve headache. She called mid December reporting an intractable migraine. Depacon infusion provided some benefit but headache returned shortly after infusion. We had her take Nurtec daily for three days and started steroid taper. She reports that she continues to have headache. Pain is across the front of her head (more so on left). It is described as a dull swollen/tight feeling.   We have discussed concerns of sleep apnea. She does not sleep  Taylor. She wakes up multiple times a night. She often wakes with headaches. She does snore. She never feels rested when she wakes. She has been hesitant to consider workup due to financial concerns.   We received a referral from PCP for chronic muscle cramps. Cramps are generalized and effect upper and lower extremities. PCP performed lab eval that did not reveal any significant etiology for symptoms. She has had tingling/pins needles sensation for over a year. Sensation comes and goes. She feels both legs are numb from tips of her toes to the knee on both sides but worse on the left. She tried gabapentin but was unable to tolerate due to brain fog. She takes '600mg'$  1-2 times weekly as needed. She reports that she still is not recovered from lumbar fusion surgery in 01/2019. Robaxin prescribed by neurosurgery. She was referred to Dr Brien Few for pain management. She reports having a normal NCS/EMG many years ago. She reports that she cant hold her neck in certain positions or her neck with lock up. She feels that her hands lock up if she tries to hold anything for any period of time. She has spasms of her legs. No incontinence.   She is taking oxycodone as needed for back pain. She denies daily use. She has filled 30 tablets on 11/17/2019 and 12/14/2019. She also has alprazolam and clonazepam on her medication list. She is not certain how often she takes either of these. Per PDMP, she alternates filling alprazolam and clonazepam. She reports that she has ongoing depression and anxiety. She was taking Wellbutrin at  last visit but reports that she has stopped this medicaiton sometime since last being seen. She is not sure when but didn't like how she felt on it.    History (copied from previous notes)  Savannah Taylor is a 66 y.o. female here today for follow up migraines. She continues to have regular headaches. She has not reached out to [place] to report headaches have continues. She has some sort of headache every  day. She has at least 16-20 migraines every month. She reports taking Amovig every 3 months. Reports that she has only gotten 1 injection every three months. Amovig helps significantly after injection. She does not use abortive medication. She did not feel Ubrelvy helped. She did not continue venlafaxine. She recently lost her mother. She continues to grieve her loss. She cries frequently. PCP has been treating depression with Wellbutrin but she hs not taken consistently. She is not sleeping Taylor. She is always tired. She wakes frequently at night with headaches or to use the bathroom. She does snore. We have discussed concerns of sleep apnea in the past but she has been reluctant to consider workup. Her son has sleep apnea.    HISTORY: (copied from my note on 07/30/2018)  Savannah Taylor is a 66 y.o. female here today for follow up for migraines and concerns of sleep apnea. She reports that headaches are unchanged. She continues to have frequent migraines but feels they are not as intense. She is caring for her mother with end stage dementia and cancer. She is taking venlafaxine '75mg'$  sporadically when she feels she needs it. She has taken Ubrelvy once but is insure if it helped.    She has not followed through with sleep apnea evaluation. She was uncertain if insurance would cover it. She is interested in continuing with evaluation.    HISTORY: (copied from my note on 03/27/2018)   Savannah Taylor is a 66 y.o. female for follow up. She is taking topiramate '100mg'$  daily and Amovig '140mg'$  monthly for management of migraines.  She reports that her headaches continue to occur daily.  She has mild pain with most headaches but does report at least 10 migraines per month.  Migraines are typically unilateral and associated with nausea, light and sound sensitivity.  Last month she took an extra dose of Aimovig and reports that that had helped some.  She states that the first 2 to 3 weeks after taking Aimovig she  feels that she is Taylor managed, however, the next week or so her migraines are pretty bad.  She is taking Zomig for abortive therapy but reports it is no longer helping.  She has been on multiple preventative medications including Topamax, verapamil, amitriptyline, Depakote, Inderal, Imitrex injections, Maxalt, Neurontin and Botox.  She is also tried Effexor and Cymbalta.  Headaches have not changed in nature.  Last imaging reviewed was a CT of her head in 2014 that was unremarkable.   She reports about 10 years ago she had a work-up for sleep apnea.  She is unsure of the results of her test but does mention that she was told if she ever reaches a deep stage of sleep.  She does endorse snoring, frequent waking, nocturia, dry mouth, occasional morning headaches, daytime sleepiness and fatigue.  She is interested in an updated sleep study.     History (copied from [employer] note dated 06/18/17)    Savannah Taylor is a 66 year old female with a history of migraine.  She states that  she had a severe headache for the last 2 to 3 weeks.  She is on last day of her prednisone Dosepak.  Reports prednisone has been beneficial.  Reports that she still has a mild headache but not as severe as it was before.  She states that her headaches typically occur on the left side.  She always has phonophobia with her headaches.  She remains on Topamax.  She also uses Zomig as needed.  She reports that [place] worked Taylor for her migraines in the first 2 months.  She states by the third month she began having a constant dull headache.  She is currently taking the 70 mg injection.  She returns today for evaluation.   HISTORY:Savannah Taylor is a 66 y.o. female here as a referral from Dr. Justin Mend for migraines. Past medical history of chronic migraines, depression, hypertension, high cholesterol, B12 deficiency, chronic insomnia, chronic kidney disease, multilevel degenerative disease. She has been seeing Dr. Domingo Cocking for nerve  injections. Migraines since a teenager without any inciting event or head trauma. She has a family history of migraines, Her son and paternal aunt have migraines. She has been having injections of the [facility], she went every month and then every 2 months (likely nerve block) but she also says that she received Botox which helped but insurance would not pay anymore. Now she just goes as needed. For nerve blocks. She has headaches 15 days a month. 8-10 are migrainous. This is been ongoing for the last 6 months. Her migraines feele like her head is swelling on one side, but they can move around, one eye droops, feels tight, she can hear her heart beating in her head especially when laying down, more pounding than pressure, she can't stand sound or noise. Dark rooms help. Being quiet helps. They can last for days straight. They can be severe with nausea and vomiting on average out of 10 can be 6-7/10. She has been to "so many doctors and so many places" and the doctors get frustrated because they can't help. She has been to headaches clinics. She was told she was faking in the past. She has tried everything per patient. No other associated symptoms or focal neurologic deficits or complaints. No aura. No medication overuse.    Tried: Topamax, verapamil, amitriptyline, depakote, inderal, she has had imitrex injections, maxalt, zomig. Neurontin, botox helped a lot but then her physician said insurance wouldn;t pay, effexor, cymbalta.    Reviewed notes, labs and imaging from outside physicians, which showed:   BUN 12 and creatinine 1.15 taken 04/05/2015. TSH 0.810 January 2017.   CT of the head 04/2012 showed No acute intracranial abnormalities including mass lesion or mass effect, hydrocephalus, extra-axial fluid collection, midline shift, hemorrhage, or acute infarction, large ischemic events (personally reviewed images)    REVIEW OF SYSTEMS: Out of a complete 14 system review of symptoms, the  patient complains only of the following symptoms, headaches, depression, insomnia, snoring, muscle cramps, chronic pain and all other reviewed systems are negative.   ALLERGIES: Allergies  Allergen Reactions   Bystolic [Nebivolol Hcl] Palpitations and Other (See Comments)   Morphine And Related Itching   Naproxen Hives and Itching   Sertraline Hcl Other (See Comments)    hallucinations   Topiramate Other (See Comments)    Apathetic "out of it"   Venlafaxine Hcl Er Other (See Comments)    Hair loss    HOME MEDICATIONS: Outpatient Medications Prior to Visit  Medication Sig Dispense Refill  albuterol (PROAIR HFA) 108 (90 Base) MCG/ACT inhaler Inhale 2 puffs into the lungs every 4 (four) hours as needed for wheezing or shortness of breath. 1 Inhaler 1   albuterol (PROVENTIL) (2.5 MG/3ML) 0.083% nebulizer solution Take 2.5 mg by nebulization every 6 (six) hours as needed for wheezing or shortness of breath.     atorvastatin (LIPITOR) 10 MG tablet Take 10 mg by mouth daily.     gabapentin (NEURONTIN) 300 MG capsule Take 600 mg by mouth at bedtime.     losartan (COZAAR) 100 MG tablet Take 50 mg by mouth daily.     omeprazole (PRILOSEC) 20 MG capsule Take 1 capsule by mouth 2 (two) times daily. Morning and evening     amLODipine (NORVASC) 5 MG tablet Take 5 mg by mouth daily.     Erenumab-aooe (AIMOVIG) 140 MG/ML SOAJ Inject 140 mg into the skin every 30 (thirty) days. 3 mL 3   Rimegepant Sulfate (NURTEC) 75 MG TBDP Take 75 mg by mouth daily as needed (take for abortive therapy of migraine, no more than 1 tablet in 24 hours or 10 per month). 8 tablet 11   ezetimibe (ZETIA) 10 MG tablet Take 10 mg by mouth daily.     No facility-administered medications prior to visit.    PAST MEDICAL HISTORY: Past Medical History:  Diagnosis Date   Abdominal cramps    Asthma    Back problem    BAD DISK IN BACK   DDD (degenerative disc disease), lumbosacral    Depression    Gastritis    GERD  (gastroesophageal reflux disease)    H/O hiatal hernia    Heart murmur    Years ago   Hyperlipidemia    Hypertension    Insomnia    Knee problem    Leg cramps    Migraine    01/22/2019- last one > 1 month ago   Post concussion syndrome    Seizure ([place])    1 many years ago with a migraine   Sleep apnea    does not use CPAP   Stroke ([place])    TIA  slight memory  issues TIA prior to 2010   Ulcer    STOMACH & SORENESS & PAIN   UTI (urinary tract infection) 07/02/2012    PAST SURGICAL HISTORY: Past Surgical History:  Procedure Laterality Date   ABDOMINAL HYSTERECTOMY     ARTHROGRAM KNEE Right    MCL tear   BACK SURGERY     PLIF L4-5, 5-S1 Dr. Ronnald Ramp 01-2019   BREAST REDUCTION SURGERY Bilateral 05/10/2021   Procedure: MAMMARY REDUCTION  (BREAST);  Surgeon: Wallace Going, DO;  Location: [facility];  Taylor: Plastics;  Laterality: Bilateral;   COLONOSCOPY     ENDOSCOPY  2020   hiatal hernia, gastritis, and some bacteria in stomach   HEMORRHOID SURGERY      FAMILY HISTORY: Family History  Problem Relation Age of Onset   Hypertension Mother    Hyperlipidemia Mother    Asthma Father    Cancer Father    Hypertension Brother    Sleep apnea Son    Breast cancer Neg Hx     SOCIAL HISTORY: Social History   Socioeconomic History   Marital status: Married    Spouse name: Eddie   Number of children: 3   Years of education: Some college   Highest education level: Not on file  Occupational History   Occupation: works full time  Tobacco Use  Smoking status: Never   Smokeless tobacco: Never  Vaping Use   Vaping Use: Never used  Substance and Sexual Activity   Alcohol use: No   Drug use: No   Sexual activity: Not Currently    Birth control/protection: Surgical  Other Topics Concern   Not on file  Social History Narrative   Lives with husband   Caffeine use: Coffee/tea/soda sometimes per patient   Social Determinants of Health   Financial  Resource Strain: Not on file  Food Insecurity: Not on file  Transportation Needs: Not on file  Physical Activity: Not on file  Stress: Not on file  Social Connections: Not on file  Intimate Partner Violence: Not on file      PHYSICAL EXAM  Vitals:   06/29/21 0906  BP: (!) 174/98  Pulse: 69  Weight: 193 lb 8 oz (87.8 kg)  Height: '5\' 5"'$  (1.651 m)    Body mass index is 32.2 kg/m.  Generalized: Taylor developed, in no acute distress  Cardiology: normal rate and rhythm, no murmur noted Respiratory: clear to auscultation bilaterally  Neurological examination  Mentation: Alert oriented to time, place, history taking. Follows all commands speech and language fluent Cranial nerve II-XII: Pupils were equal round reactive to light. Extraocular movements were full, visual field were full  Motor: The motor testing reveals 5 over 5 strength of all 4 extremities. Good symmetric motor tone is noted throughout.  Gait and station: Gait is normal.      DIAGNOSTIC DATA (LABS, IMAGING, TESTING) - I reviewed patient records, labs, notes, testing and imaging myself where available.      No data to display           Lab Results  Component Value Date   WBC 6.9 07/07/2020   HGB 13.5 07/07/2020   HCT 40.6 07/07/2020   MCV 89.8 07/07/2020   PLT 333 07/07/2020      Component Value Date/Time   NA 140 07/07/2020 1750   NA 142 08/31/2015 1313   K 3.7 07/07/2020 1750   CL 104 07/07/2020 1750   CO2 27 07/07/2020 1750   GLUCOSE 100 (H) 07/07/2020 1750   BUN 11 07/07/2020 1750   BUN 12 08/31/2015 1313   CREATININE 0.95 07/07/2020 1750   CALCIUM 9.9 07/07/2020 1750   PROT 7.1 08/31/2015 1313   ALBUMIN 4.4 08/31/2015 1313   AST 14 08/31/2015 1313   ALT 9 08/31/2015 1313   ALKPHOS 74 08/31/2015 1313   BILITOT 0.3 08/31/2015 1313   GFRNONAA >60 07/07/2020 1750   GFRAA >60 01/22/2019 1030   No results found for: "CHOL", "HDL", "LDLCALC", "LDLDIRECT", "TRIG", "CHOLHDL" No results found  for: "HGBA1C" No results found for: "VITAMINB12" No results found for: "TSH"     ASSESSMENT AND PLAN 66 y.o. year old female  has a past medical history of Abdominal cramps, Asthma, Back problem, DDD (degenerative disc disease), lumbosacral, Depression, Gastritis, GERD (gastroesophageal reflux disease), H/O hiatal hernia, Heart murmur, Hyperlipidemia, Hypertension, Insomnia, Knee problem, Leg cramps, Migraine, Post concussion syndrome, Seizure ([place]), Sleep apnea, Stroke ([place]), Ulcer, and UTI (urinary tract infection) (07/02/2012). here with     ICD-10-CM   1. Chronic migraine without aura, intractable, without status migrainosus  G43.719         Savannah Taylor on Amovig but recently noted to have more tension style headaches. BP has been elevated. We will switch her to Ajovy every 30 days. One sample pen given in office, today. Discontinue Amovig.  She will continue Nurtec for migraine abortion but advised it may not help with tension headaches. She may use Tylenol and Benadryl. Avoid Nsaids d/t stomach ulcer. Triptan not effective. May consider nasal CGRP in future if needed. She was encouraged to contact PCP asap regarding BP readings. She has not take her HTN agent today. She was encouraged to take as soon as she gets home and monitor BP. She will focus on healthy lifestyle habits as discussed.  We will plan to follow-up in 4-6 months pending further work-up.  May adjust as needed.  She verbalizes understanding and agreement with this plan.   No orders of the defined types were placed in this encounter.    Meds ordered this encounter  Medications   Fremanezumab-vfrm (AJOVY) 225 MG/1.5ML SOAJ    Sig: Inject 225 mg into the skin every 30 (thirty) days.    Dispense:  4.5 mL    Refill:  3    Order Specific Question:   Supervising Provider    Answer:   Savannah Beam [[id number]]   Rimegepant Sulfate (NURTEC) 75 MG TBDP    Sig: Take 75 mg by mouth daily as needed  (take for abortive therapy of migraine, no more than 1 tablet in 24 hours or 10 per month).    Dispense:  8 tablet    Refill:  11    Order Specific Question:   Supervising Provider    Answer:   Savannah Beam [id number], FNP-C 06/29/2021, 9:55 AM [facility] [address] [place], [place] [address] [phone]

## 2021-06-29 NOTE — Patient Instructions (Signed)
Below is our plan:  We will switch Amovig to Ajovy. You will continue 1 injection every 30 days. Continue Nurtec for migraine abortion. Please call your primary care provider to discuss blood pressures. I am concerned this is related to your current headache.   You can try Tylenol '1000mg'$  with Benadryl 25-'50mg'$  and Nurtec for intractable headaches.  Please make sure you are staying well hydrated. I recommend 50-60 ounces daily. Well balanced diet and regular exercise encouraged. Consistent sleep schedule with 6-8 hours recommended.   Please continue follow up with care team as directed.   Follow up with me in 4-6 months   You may receive a survey regarding today's visit. I encourage you to leave honest feed back as I do use this information to improve patient care. Thank you for seeing me today!

## 2021-07-03 DIAGNOSIS — I10 Essential (primary) hypertension: Secondary | ICD-10-CM | POA: Diagnosis not present

## 2021-07-03 DIAGNOSIS — E785 Hyperlipidemia, unspecified: Secondary | ICD-10-CM | POA: Diagnosis not present

## 2021-07-03 DIAGNOSIS — Z0000 Encounter for general adult medical examination without abnormal findings: Secondary | ICD-10-CM | POA: Diagnosis not present

## 2021-07-05 ENCOUNTER — Telehealth: Payer: Self-pay | Admitting: Neurology

## 2021-07-05 NOTE — Telephone Encounter (Signed)
PA completed through [employer] KEY:  [id number] Will await response 1-3 days

## 2021-07-05 NOTE — Telephone Encounter (Signed)
PA approved for the patient.  approval authorizes your coverage from 01/01/2021 - 12/31/2021,

## 2021-07-20 ENCOUNTER — Telehealth: Payer: Self-pay | Admitting: Family Medicine

## 2021-07-20 ENCOUNTER — Telehealth: Payer: Self-pay

## 2021-07-20 DIAGNOSIS — E669 Obesity, unspecified: Secondary | ICD-10-CM

## 2021-07-20 DIAGNOSIS — R519 Headache, unspecified: Secondary | ICD-10-CM

## 2021-07-20 NOTE — Telephone Encounter (Signed)
Pt called back stating that she is still experiencing soreness bilaterally, right worse than left. Right is now radiating towards the back when sitting back in a chair. She has been taking Tylenol with no resolve. Unable to take NSAIDs due to ulcer. I let her know it was normal to have pain and soreness for a few months after lipo. No redness, swelling, fevers, chills. She was wanting me to relay information because she was told to call back if there was no resolve with the Tylenol.   BL Breast Reduction 5/10 with lipo.  Please advise. Thanks.

## 2021-07-20 NOTE — Telephone Encounter (Addendum)
Called pt back. She is now on losartan '50mg'$  po BID. Started twice daily dosing after visit with AL,NP 06/29/21.  She is still having daily headaches that last all day. Pain located over whole head. Feels full/swollen/tight.  Last Ajovy inj 06/29/21 (received sample at [facility]). Was not able to pick up Ajovy rx from pharmacy, told PA needed. Advised I will speak with pharmacy to confirm since we never got request to complete PA.   She has not taken tylenol/benadryl prn as previously recommended at last visit. I advised she can take 1 tablet of each to see if this is beneficial. Advised her not to drive as benadryl can make her sleepy.  She wants to know what else Amy would recommend. Aware I will speak w/ NP and pharmacy and then call her back.   Called [facility] at [phone]. They do not open until 9am.  Submitted PA Ajovy on CMM. Key: [id number]. Waiting on determination from [facility].  PA approved  01/01/2021 - 12/31/2021.

## 2021-07-20 NOTE — Telephone Encounter (Signed)
Pt states she took the advise of Amy, NP re: lowering her blood pressure.  Pt states her pcp has her on a new medication and she is now having readings of  121/63 as of last night at 10pm she is still having headaches and would like a call to discuss.

## 2021-07-20 NOTE — Telephone Encounter (Signed)
Called and spoke w/ pt. Relayed message from AL,NP. She is agreeable to repeat home sleep test. She has lost weight. Was 218lb 11/18/20 and now she weighs 194lb (last sleep study 03/28/20). Placed order for HST. She is aware once approved via insurance, she will be called to schedule.  She will continue on Ajovy monthly inj. Aware PA approved. She will continue abortive meds as well.

## 2021-07-25 DIAGNOSIS — R635 Abnormal weight gain: Secondary | ICD-10-CM | POA: Diagnosis not present

## 2021-07-31 ENCOUNTER — Telehealth: Payer: Self-pay | Admitting: Family Medicine

## 2021-07-31 DIAGNOSIS — R208 Other disturbances of skin sensation: Secondary | ICD-10-CM | POA: Diagnosis not present

## 2021-07-31 DIAGNOSIS — Z789 Other specified health status: Secondary | ICD-10-CM | POA: Diagnosis not present

## 2021-07-31 DIAGNOSIS — D2361 Other benign neoplasm of skin of right upper limb, including shoulder: Secondary | ICD-10-CM | POA: Diagnosis not present

## 2021-07-31 MED ORDER — NARATRIPTAN HCL 2.5 MG PO TABS: 2.5000 mg | ORAL_TABLET | ORAL | 0 refills | Status: DC | PRN

## 2021-07-31 NOTE — Telephone Encounter (Signed)
Pt would like to know if she could try a previous medication again, please call pt re: trying topomax again

## 2021-07-31 NOTE — Telephone Encounter (Signed)
Called pt. She is still having headaches that are daily and constant. Has had no relief from Ajovy, nurtec, benadryl or tylenol. Has not been contacted to schedule home sleep study yet. Advised I will send message to sleep lab to see about getting update on getting her scheduled. Advised it is important to see if she has OSA that could be contributin to daily headaches. That way, if she does, we can get her started on treatment for this which will help headaches.  I did advise it could take up to three months for Ajovy to reach max benefit. She states she is wanting to know if she can restart topamax. I did relay I saw on her list of contraindications that it mentioned she was "apathetic, out of it" on this. She wants to know if Amy has any recommendations since headaches have not improved.

## 2021-07-31 NOTE — Telephone Encounter (Signed)
Called pt. Relayed Amy's message. She is agreeable to try Amerge. E-scribed to pharmacy. Went over instructions. She will be on look out for phone call about getting HST scheduled as well.

## 2021-08-01 ENCOUNTER — Telehealth: Payer: Self-pay | Admitting: Family Medicine

## 2021-08-01 NOTE — Telephone Encounter (Signed)
HST- [employer] medicare no auth req spoke to [place] ref # [id number].  Patient is scheduled at [facility] for 08/22/21 at 2 pm.  Mailed packet to the patient.

## 2021-08-02 NOTE — Telephone Encounter (Signed)
HST- [employer] medicare no Josem Kaufmann req spoke to [place] ref # [id number]   Patient is scheduled for 08/22/21 at [facility].

## 2021-08-22 ENCOUNTER — Ambulatory Visit (INDEPENDENT_AMBULATORY_CARE_PROVIDER_SITE_OTHER): Payer: Medicare HMO | Admitting: Neurology

## 2021-08-22 DIAGNOSIS — R519 Headache, unspecified: Secondary | ICD-10-CM

## 2021-08-22 DIAGNOSIS — G4733 Obstructive sleep apnea (adult) (pediatric): Secondary | ICD-10-CM | POA: Diagnosis not present

## 2021-08-22 DIAGNOSIS — R0683 Snoring: Secondary | ICD-10-CM

## 2021-08-22 DIAGNOSIS — E669 Obesity, unspecified: Secondary | ICD-10-CM

## 2021-08-24 NOTE — Progress Notes (Signed)
See procedure note.

## 2021-08-24 NOTE — Procedures (Signed)
   [facility]  HOME SLEEP TEST (Watch PAT) REPORT  STUDY DATE: 08/23/2021  DOB: 07/05/1955  MRN: [id number]  ORDERING CLINICIAN: Star Age, MD, PhD   REFERRING CLINICIAN: Debbora Presto, NP   CLINICAL INFORMATION/HISTORY: 66 year old woman with a history of asthma, degenerative disc disease, reflux disease, hiatal hernia, hyperlipidemia, hypertension, migraines, history of seizure, stroke, and obesity, who presents for reevaluation of her obstructive sleep apnea.  Home sleep testing in March 2022 indicated moderate obstructive sleep apnea with an AHI of 16.2/h, O2 nadir 85%.  She has had interim weight loss of about 12 pounds.  Epworth sleepiness score: 7/24.  BMI: 32.2 kg/m  FINDINGS:   Sleep Summary:   Total Recording Time (hours, min): 8 hours, 40 min  Total Sleep Time (hours, min):  6 hours, 45 min  Percent REM (%):    22.4%   Respiratory Indices:   Calculated pAHI (per hour):  2.3/hour         REM pAHI:    5.4/hour       NREM pAHI: 1.8/hour  Central pAHI: 0/hour  Oxygen Saturation Statistics:    Oxygen Saturation (%) Mean: 95%   Minimum oxygen saturation (%):                 92%   O2 Saturation Range (%): 92-99%    O2 Saturation (minutes) <=88%: 0 min  Pulse Rate Statistics:   Pulse Mean (bpm):    80/min    Pulse Range (62-97/min)   IMPRESSION: Primary snoring  RECOMMENDATION:  This home sleep test does not demonstrate any significant obstructive or central sleep disordered breathing.  Mild to moderate snoring was detected.  Based on the current home sleep test results, treatment with positive airway pressure such as AutoPAP is not indicated.  Snoring may further improve with ongoing weight loss. Other causes of the patient's symptoms, including circadian rhythm disturbances, an underlying mood disorder, medication effect and/or an underlying medical problem cannot be ruled out based on this test. Clinical correlation is recommended.  The patient should be cautioned not to drive, work at heights, or operate dangerous or heavy equipment when tired or sleepy. Review and reiteration of good sleep hygiene measures should be pursued with any patient. The patient can follow up with her referring provider, who will be notified of the test results. An appointment in sleep clinic can be made as necessary.   I certify that I have reviewed the raw data recording prior to the issuance of this report in accordance with the standards of the American Academy of Sleep Medicine (AASM).  INTERPRETING PHYSICIAN:   Star Age, MD, PhD  Board Certified in Neurology and Sleep Medicine  [facility] [address], [place], [place] [address] [phone]

## 2021-08-25 ENCOUNTER — Other Ambulatory Visit: Payer: Self-pay | Admitting: *Deleted

## 2021-08-25 NOTE — Patient Outreach (Signed)
  Care Coordination   08/25/2021 Name: Savannah Taylor MRN: [id number] DOB: 1955-11-03   Care Coordination Outreach Attempts:  An unsuccessful telephone outreach was attempted today to offer the patient information about available care coordination services as a benefit of their health plan.   Follow Up Plan:  Additional outreach attempts will be made to offer the patient care coordination information and services.   Encounter Outcome:  No Answer  Care Coordination Interventions Activated:  No   Care Coordination Interventions:  No, not indicated    Raina Mina, RN Care Management Coordinator [place] [facility] [phone]

## 2021-08-28 ENCOUNTER — Encounter: Payer: Self-pay | Admitting: *Deleted

## 2021-09-06 DIAGNOSIS — Z23 Encounter for immunization: Secondary | ICD-10-CM | POA: Diagnosis not present

## 2021-09-06 DIAGNOSIS — I10 Essential (primary) hypertension: Secondary | ICD-10-CM | POA: Diagnosis not present

## 2021-09-06 DIAGNOSIS — G4700 Insomnia, unspecified: Secondary | ICD-10-CM | POA: Diagnosis not present

## 2021-09-12 ENCOUNTER — Ambulatory Visit: Payer: Medicare HMO | Admitting: Student

## 2021-09-12 DIAGNOSIS — N62 Hypertrophy of breast: Secondary | ICD-10-CM

## 2021-09-12 DIAGNOSIS — N6459 Other signs and symptoms in breast: Secondary | ICD-10-CM | POA: Diagnosis not present

## 2021-09-12 DIAGNOSIS — Z9889 Other specified postprocedural states: Secondary | ICD-10-CM

## 2021-09-12 NOTE — Progress Notes (Signed)
   Referring Provider No referring provider defined for this encounter.   CC:  Chief Complaint  Patient presents with   Follow-up      Savannah Taylor is an 66 y.o. female.  HPI: Patient is a 66 year old female who underwent bilateral breast reduction with Dr. Marla Roe on 05/10/2021.  Patient presents to the clinic today for pain to her right axilla.  Today, patient reports she is doing okay.  Patient states that she has been having some stinging pain to her breasts bilaterally.  She states that this pain is intermittent.  Patient also reports some soreness to axilla, with her right being worse than her left.  Patient states that she sometimes takes Tylenol for this pain.  Patient denies any fevers or chills.  Patient also reports she has some imprints left from the honeycomb dressing she had left on her breast.  Review of Systems General: Denies fevers or chills  Physical Exam    06/29/2021    9:06 AM 05/10/2021    3:47 PM 05/10/2021    3:30 PM  Vitals with BMI  Height '5\' 5"'$     Weight 193 lbs 8 oz    BMI 85.4    Systolic [phone]  Diastolic 98 82 75  Pulse 69 68 66    General:  No acute distress,  Alert and oriented, Non-Toxic, Normal speech and affect Chaperone present on exam.  On exam, patient is sitting upright in no acute distress.  Breasts are soft and fairly symmetric.  There is no overlying erythema or ecchymosis.  There is a small area in the left breast and a small area in the right breast of firmness that is consistent with fat necrosis bilaterally.  Incisions have healed nicely.  There is very minimal tenderness to palpation along the axilla.  There is no significant fluid collections noted upon palpation.  There is no overlying erythema or ecchymosis.  Assessment/Plan  S/P bilateral breast reduction  Symptomatic mammary hypertrophy   I discussed with the patient that some of the stinging pain may be due to nerve pain.  I discussed with the patient that this  can be present for a very long time after surgery.  I discussed with the patient that her axillary pain may be from the liposuction that she underwent from her surgery, but I will order ultrasounds to ensure there is no underlying postoperative fluid or changes.  Patient expressed understanding and agreed with this plan.  I discussed with the patient to continue compression for now, and to continue to gently massage the areas of firmness.  I discussed with the patient that I would like her to follow-up with Dr. Marla Roe to discuss her fat necrosis.  Patient expressed understanding.  I discussed with the patient that she can use Skinuva scar cream over where the honeycomb dressing left in imprint.  I instructed the patient to call back if she has any other questions or concerns.  Patient to follow-up in 2 weeks with Dr. Marla Roe.  I discussed the objective findings and plan with Dr. Marla Roe.  Clance Boll 09/12/2021, 4:07 PM

## 2021-09-13 ENCOUNTER — Ambulatory Visit: Payer: Self-pay

## 2021-09-13 NOTE — Patient Instructions (Signed)
Visit Information  Thank you for taking time to visit with me today. Please don't hesitate to contact me if I can be of assistance to you.   Following are the goals we discussed today:   Goals Addressed             This Visit's Progress    COMPLETED: Care Coordination Activities       Care Coordination Interventions: SDoH screening performed - no acute challenges at this time but did discuss difficult at times to afford cost of living Education on [place] (LIEAP) provided Determined the patient does not have concerns with medication costs and/or adherence Discussed the patient does not have an advance directive but is interested in completing one Education on benefit of advance care planning reviewed, mailed patient an advance directive packet Education on role of care coordination team - no follow up desired at this time Instructed the patient to contact her primary care provider as needed        Please call the care guide team at [phone] if you need to schedule an appointment with our care coordination team  If you are experiencing a Mental Health or [place] or need someone to talk to, please call 1-800-273-TALK (toll free, 24 hour hotline)  Patient verbalizes understanding of instructions and care plan provided today and agrees to view in [place]. Active MyChart status and patient understanding of how to access instructions and care plan via MyChart confirmed with patient.     No further follow up required: Please contact your primary care provider as needed  Daneen Schick, Arita Miss, CDP Social Worker, Certified Dementia Practitioner [place] [facility] [phone]

## 2021-09-13 NOTE — Patient Outreach (Signed)
  Care Coordination   Initial Visit Note   09/13/2021 Name: Savannah Taylor MRN: [id number] DOB: [birth year]  Savannah Taylor is a 66 y.o. year old female who sees System, Provider Not In for primary care. I spoke with  Loralee Pacas by phone today.  What matters to the patients health and wellness today?  Doing well, no concerns at this time    Goals Addressed             This Visit's Progress    COMPLETED: Care Coordination Activities       Care Coordination Interventions: SDoH screening performed - no acute challenges at this time but did discuss difficult at times to afford cost of living Education on [place] (LIEAP) provided Determined the patient does not have concerns with medication costs and/or adherence Discussed the patient does not have an advance directive but is interested in completing one Education on benefit of advance care planning reviewed, mailed patient an advance directive packet Education on role of care coordination team - no follow up desired at this time Instructed the patient to contact her primary care provider as needed        SDOH assessments and interventions completed:  Yes  SDOH Interventions Today    Flowsheet Row Most Recent Value  SDOH Interventions   Food Insecurity Interventions Intervention Not Indicated  Housing Interventions Intervention Not Indicated  Transportation Interventions Intervention Not Indicated        Care Coordination Interventions Activated:  Yes  Care Coordination Interventions:  Yes, provided   Follow up plan: No further intervention required.   Encounter Outcome:  Pt. Visit Completed   Daneen Schick, BSW, CDP Social Worker, Certified Dementia Practitioner [facility]  Care Coordination [phone]

## 2021-09-15 ENCOUNTER — Other Ambulatory Visit: Payer: Self-pay | Admitting: Student

## 2021-09-15 DIAGNOSIS — N644 Mastodynia: Secondary | ICD-10-CM

## 2021-09-15 DIAGNOSIS — Z9889 Other specified postprocedural states: Secondary | ICD-10-CM

## 2021-09-26 ENCOUNTER — Ambulatory Visit (INDEPENDENT_AMBULATORY_CARE_PROVIDER_SITE_OTHER): Payer: Medicare HMO | Admitting: Plastic Surgery

## 2021-09-26 ENCOUNTER — Encounter: Payer: Self-pay | Admitting: Plastic Surgery

## 2021-09-26 DIAGNOSIS — N644 Mastodynia: Secondary | ICD-10-CM | POA: Diagnosis not present

## 2021-09-26 DIAGNOSIS — N62 Hypertrophy of breast: Secondary | ICD-10-CM | POA: Diagnosis not present

## 2021-09-26 NOTE — Progress Notes (Signed)
   Subjective:    Patient ID: Savannah Taylor, female    DOB: 1955-11-07, 66 y.o.   MRN: [id number]  The patient is a 66 year old female joining me by phone for further discussion about her breasts.  She underwent bilateral breast reductions in May with over 800 g removed from both breasts.  The patient is concerned about pain she is having that is radiating to her back.  She has some areas of fat necrosis but I cannot explain the radiating pain based on that.  She has a ultrasound scheduled for the next 2 weeks I have encouraged her to keep that appointment.    Review of Systems  Eyes: Negative.   Respiratory: Negative.    Cardiovascular: Negative.   Endocrine: Negative.   Genitourinary: Negative.   Musculoskeletal:  Positive for back pain.       Objective:   Physical Exam      Assessment & Plan:     ICD-10-CM   1. Symptomatic mammary hypertrophy  N62        I connected with  HAN LYSNE on 09/26/21 by phone and verified that I am speaking with the correct person using two identifiers. We spent 5 minutes in discussion. She was at home and I was at the office.   The patient will have the ultrasound in the next 2 weeks and then come back to see me.   I discussed the limitations of evaluation and management by telemedicine. The patient expressed understanding and agreed to proceed.

## 2021-09-28 ENCOUNTER — Ambulatory Visit: Payer: Medicare HMO | Admitting: Adult Health

## 2021-10-12 DIAGNOSIS — G4700 Insomnia, unspecified: Secondary | ICD-10-CM | POA: Diagnosis not present

## 2021-10-12 DIAGNOSIS — R55 Syncope and collapse: Secondary | ICD-10-CM | POA: Diagnosis not present

## 2021-10-12 DIAGNOSIS — R413 Other amnesia: Secondary | ICD-10-CM | POA: Diagnosis not present

## 2021-10-12 DIAGNOSIS — R69 Illness, unspecified: Secondary | ICD-10-CM | POA: Diagnosis not present

## 2021-10-12 DIAGNOSIS — R9431 Abnormal electrocardiogram [ECG] [EKG]: Secondary | ICD-10-CM | POA: Diagnosis not present

## 2021-10-12 DIAGNOSIS — E785 Hyperlipidemia, unspecified: Secondary | ICD-10-CM | POA: Diagnosis not present

## 2021-10-12 DIAGNOSIS — I10 Essential (primary) hypertension: Secondary | ICD-10-CM | POA: Diagnosis not present

## 2021-10-16 ENCOUNTER — Ambulatory Visit: Payer: Medicare HMO | Attending: Cardiovascular Disease | Admitting: Cardiovascular Disease

## 2021-10-16 ENCOUNTER — Encounter: Payer: Self-pay | Admitting: Cardiovascular Disease

## 2021-10-16 VITALS — BP 118/76 | HR 82 | Ht 65.0 in | Wt 191.4 lb

## 2021-10-16 DIAGNOSIS — R0609 Other forms of dyspnea: Secondary | ICD-10-CM

## 2021-10-16 DIAGNOSIS — Z01812 Encounter for preprocedural laboratory examination: Secondary | ICD-10-CM | POA: Diagnosis not present

## 2021-10-16 DIAGNOSIS — R072 Precordial pain: Secondary | ICD-10-CM | POA: Diagnosis not present

## 2021-10-16 DIAGNOSIS — R079 Chest pain, unspecified: Secondary | ICD-10-CM | POA: Diagnosis not present

## 2021-10-16 MED ORDER — METOPROLOL TARTRATE 100 MG PO TABS: 100.0000 mg | ORAL_TABLET | Freq: Once | ORAL | 0 refills | Status: DC

## 2021-10-16 NOTE — Addendum Note (Signed)
Addended by: Rodman Key on: 10/16/2021 04:31 PM   Modules accepted: Orders

## 2021-10-16 NOTE — Progress Notes (Signed)
Chief Complaint  Patient presents with   New Patient (Initial Visit)    Near syncope, abnormal EKG   History of Present Illness: 66 yo female with history of depression, GERD, hiatal hernia, HTN, HLD, prior TIA and sleep apnea who is here today as a new consult, referred by Lidia Collum, NP, for the evaluation of near syncope and abnormal EKG. EKG in primary care with sinus rhythm and poor R wave progression through the precordial leads. She describes dizziness for the past few months. She feels dizzy almost every day. No syncope. She has pressure in her chest. This is at rest but does worsen with exertion. She also has dyspnea on exertion.   Primary Care Physician: System, Provider Not In   Past Medical History:  Diagnosis Date   Abdominal cramps    Asthma    Back problem    BAD DISK IN BACK   DDD (degenerative disc disease), lumbosacral    Depression    Gastritis    GERD (gastroesophageal reflux disease)    H/O hiatal hernia    Heart murmur    Years ago   Hyperlipidemia    Hypertension    Insomnia    Knee problem    Leg cramps    Migraine    01/22/2019- last one > 1 month ago   Post concussion syndrome    Seizure ([place])    1 many years ago with a migraine   Sleep apnea    does not use CPAP   Stroke ([place])    TIA  slight memory  issues TIA prior to 2010   Ulcer    STOMACH & SORENESS & PAIN   UTI (urinary tract infection) 07/02/2012    Past Surgical History:  Procedure Laterality Date   ABDOMINAL HYSTERECTOMY     ARTHROGRAM KNEE Right    MCL tear   BACK SURGERY     PLIF L4-5, 5-S1 Dr. Ronnald Ramp 01-2019   BREAST REDUCTION SURGERY Bilateral 05/10/2021   Procedure: MAMMARY REDUCTION  (BREAST);  Surgeon: Wallace Going, DO;  Location: [facility];  Service: Plastics;  Laterality: Bilateral;   COLONOSCOPY     ENDOSCOPY  2020   hiatal hernia, gastritis, and some bacteria in stomach   HEMORRHOID SURGERY      Current Outpatient Medications  Medication  Sig Dispense Refill   amLODipine (NORVASC) 5 MG tablet Take 5 mg by mouth in the morning and at bedtime.     Cholecalciferol (VITAMIN D3 PO) Take 5,000 mg by mouth daily.     Fremanezumab-vfrm (AJOVY) 225 MG/1.5ML SOAJ Inject 225 mg into the skin every 30 (thirty) days. 4.5 mL 3   Magnesium 250 MG TABS Take 1 tablet by mouth daily.     metoprolol tartrate (LOPRESSOR) 100 MG tablet Take 1 tablet (100 mg total) by mouth once for 1 dose. Take 90-120 minutes prior to scan. 1 tablet 0   omeprazole (PRILOSEC) 20 MG capsule Take 1 capsule by mouth 2 (two) times daily. Morning and evening     Rimegepant Sulfate (NURTEC) 75 MG TBDP Take 75 mg by mouth daily as needed (take for abortive therapy of migraine, no more than 1 tablet in 24 hours or 10 per month). 8 tablet 11   traMADol (ULTRAM) 50 MG tablet Take 50 mg by mouth every 6 (six) hours as needed for moderate pain.     No current facility-administered medications for this visit.    Allergies  Allergen Reactions  Bystolic [Nebivolol Hcl] Palpitations and Other (See Comments)   Morphine And Related Itching   Naproxen Hives and Itching   Sertraline Hcl Other (See Comments)    hallucinations   Topiramate Other (See Comments)    Apathetic "out of it"   Venlafaxine Hcl Er Other (See Comments)    Hair loss    Social History   Socioeconomic History   Marital status: Married    Spouse name: Eddie   Number of children: 3   Years of education: Some college   Highest education level: Not on file  Occupational History   Occupation: works full time  Tobacco Use   Smoking status: Never   Smokeless tobacco: Never  Vaping Use   Vaping Use: Never used  Substance and Sexual Activity   Alcohol use: No   Drug use: No   Sexual activity: Not Currently    Birth control/protection: Surgical  Other Topics Concern   Not on file  Social History Narrative   Lives with husband   Caffeine use: Coffee/tea/soda sometimes per patient   Social  Determinants of Health   Financial Resource Strain: Not on file  Food Insecurity: No Food Insecurity (09/13/2021)   Hunger Vital Sign    Worried About Running Out of Food in the Last Year: Never true    Ran Out of Food in the Last Year: Never true  Transportation Needs: No Transportation Needs (09/13/2021)   PRAPARE - Hydrologist (Medical): No    Lack of Transportation (Non-Medical): No  Physical Activity: Not on file  Stress: Not on file  Social Connections: Not on file  Intimate Partner Violence: Not on file    Family History  Problem Relation Age of Onset   Hypertension Mother    Hyperlipidemia Mother    Asthma Father    Cancer Father    Hypertension Brother    Sleep apnea Son    Breast cancer Neg Hx     Review of Systems:  As stated in the HPI and otherwise negative.   BP 118/76   Pulse 82   Ht '5\' 5"'$  (1.651 m)   Wt 191 lb 6.4 oz (86.8 kg)   SpO2 98%   BMI 31.85 kg/m   Physical Examination: General: Well developed, well nourished, NAD  HEENT: OP clear, mucus membranes moist  SKIN: warm, dry. No rashes. Neuro: No focal deficits  Musculoskeletal: Muscle strength 5/5 all ext  Psychiatric: Mood and affect normal  Neck: No JVD, no carotid bruits, no thyromegaly, no lymphadenopathy.  Lungs:Clear bilaterally, no wheezes, rhonci, crackles Cardiovascular: Regular rate and rhythm. No murmurs, gallops or rubs. Abdomen:Soft. Bowel sounds present. Non-tender.  Extremities: No lower extremity edema. Pulses are 2 + in the bilateral DP/PT.  EKG:  EKG is ordered today. The ekg ordered today demonstrates Sinus, LVH, Poor R wave progression  Recent Labs: No results found for requested labs within last 365 days.   Lipid Panel No results found for: "CHOL", "TRIG", "HDL", "CHOLHDL", "VLDL", "LDLCALC", "LDLDIRECT"   Wt Readings from Last 3 Encounters:  10/16/21 191 lb 6.4 oz (86.8 kg)  06/29/21 193 lb 8 oz (87.8 kg)  05/10/21 190 lb 0.6 oz (86.2  kg)      Assessment and Plan:   1. Chest pain/Dyspnea on exertion: She has had several months of CP and SOB with exertion. EKG with LVH and poor R wave progression but no ischemic changes. Given age, I think we need to exclude CAD. Will  arrange a cardiac CTA to exclude obstructive CAD. Echo to assess LVEF and exclude structural heart disease.   Labs/ tests ordered today include:   Orders Placed This Encounter  Procedures   CT CORONARY MORPH W/CTA COR W/SCORE W/CA W/CM &/OR WO/CM   Basic Metabolic Panel (BMET)   EKG 12-Lead   ECHOCARDIOGRAM COMPLETE   Disposition:   F/U with me in 6-8 weeks   Signed, Lauree Chandler, MD, [facility] 10/16/2021 4:11 PM    [facility] [place], [place], [place]  [address] Phone: [phone]; Fax: [phone]

## 2021-10-16 NOTE — Patient Instructions (Addendum)
Medication Instructions:  No changes *If you need a refill on your cardiac medications before your next appointment, please call your pharmacy*   Lab Work: Today: BMET If you have labs (blood work) drawn today and your tests are completely normal, you will receive your results only by: [place] (if you have MyChart) OR A paper copy in the mail If you have any lab test that is abnormal or we need to change your treatment, we will call you to review the results.   Testing/Procedures: Your physician has requested that you have an echocardiogram. Echocardiography is a painless test that uses sound waves to create images of your heart. It provides your doctor with information about the size and shape of your heart and how well your heart's chambers and valves are working. This procedure takes approximately one hour. There are no restrictions for this procedure. Please do NOT wear cologne, perfume, aftershave, or lotions (deodorant is allowed). Please arrive 15 minutes prior to your appointment time.  Cardiac CTA - see instructions below   Follow-Up: At [facility], you and your health needs are our priority.  As part of our continuing mission to provide you with exceptional heart care, we have created designated Provider Care Teams.  These Care Teams include your primary Cardiologist (physician) and Advanced Practice Providers (APPs -  Physician Assistants and Nurse Practitioners) who all work together to provide you with the care you need, when you need it.   Your next appointment:   6-8 week(s)  The format for your next appointment:   In Person  Provider:   Lauree Chandler, MD  Other Instructions   Your cardiac CT will be scheduled at one of the below locations:   [facility] [address] [place], [place] [address] [phone]   If scheduled at [facility], please arrive at the [facility] and Children's Entrance (Entrance C2) of  [facility] 30 minutes prior to test start time. You can use the FREE valet parking offered at entrance C (encouraged to control the heart rate for the test)  Proceed to the [facility] Radiology Department (first floor) to check-in and test prep.  All radiology patients and guests should use entrance C2 at [facility], accessed from [facility], even though the hospital's physical address listed is [address].     Please follow these instructions carefully (unless otherwise directed):   On the Night Before the Test: Be sure to Drink plenty of water. Do not consume any caffeinated/decaffeinated beverages or chocolate 12 hours prior to your test. Do not take any antihistamines 12 hours prior to your test.  On the Day of the Test: Drink plenty of water until 1 hour prior to the test. Do not eat any food 1 hour prior to test. You may take your regular medications prior to the test.  Take metoprolol (Lopressor) two hours prior to test. FEMALES- please wear underwire-free bra if available, avoid dresses & tight clothing  After the Test: Drink plenty of water. After receiving IV contrast, you may experience a mild flushed feeling. This is normal. On occasion, you may experience a mild rash up to 24 hours after the test. This is not dangerous. If this occurs, you can take Benadryl 25 mg and increase your fluid intake. If you experience trouble breathing, this can be serious. If it is severe call 911 IMMEDIATELY. If it is mild, please call our office. If you take any of  these medications: Glipizide/Metformin, Avandament, Glucavance, please do not take 48 hours after completing test unless otherwise instructed.  We will call to schedule your test 2-4 weeks out understanding that some insurance companies will need an authorization prior to the service being performed.   For non-scheduling related questions, please contact the cardiac imaging nurse  navigator should you have any questions/concerns: Marchia Bond, Cardiac Imaging Nurse Navigator Gordy Clement, Cardiac Imaging Nurse Navigator [facility] Direct Office Dial: [phone]   For scheduling needs, including cancellations and rescheduling, please call [place], [phone].

## 2021-10-19 ENCOUNTER — Ambulatory Visit: Payer: Medicare HMO

## 2021-10-19 ENCOUNTER — Other Ambulatory Visit: Payer: Medicare HMO

## 2021-10-19 ENCOUNTER — Ambulatory Visit
Admission: RE | Admit: 2021-10-19 | Discharge: 2021-10-19 | Disposition: A | Discharge status: Home / Self Care | Payer: Medicare HMO | Source: Ambulatory Visit | Attending: Student | Admitting: Student

## 2021-10-19 DIAGNOSIS — N644 Mastodynia: Secondary | ICD-10-CM | POA: Diagnosis not present

## 2021-10-19 DIAGNOSIS — Z9889 Other specified postprocedural states: Secondary | ICD-10-CM

## 2021-10-19 DIAGNOSIS — R92323 Mammographic fibroglandular density, bilateral breasts: Secondary | ICD-10-CM | POA: Diagnosis not present

## 2021-10-20 ENCOUNTER — Telehealth: Payer: Self-pay | Admitting: Student

## 2021-10-20 NOTE — Telephone Encounter (Signed)
Patient was called and results of imaging were discussed. Patient to follow up with Dr. Marla Roe in a few weeks. Patient agreed with plan.

## 2021-10-25 NOTE — Patient Instructions (Signed)
Below is our plan:  We will stop Ajovy injections. Start Qulipta '60mg'$  daily for migraine prevention. Continue Nurtec for abortive therapy. I will call in a prednisone taper for yout o use for current headache abortion. Take as directed.   Please make sure you are staying well hydrated. I recommend 50-60 ounces daily. Well balanced diet and regular exercise encouraged. Consistent sleep schedule with 6-8 hours recommended.   Please continue follow up with care team as directed.   Follow up with me in 4 months   You may receive a survey regarding today's visit. I encourage you to leave honest feed back as I do use this information to improve patient care. Thank you for seeing me today!

## 2021-10-25 NOTE — Progress Notes (Unsigned)
PATIENT: Savannah Taylor DOB: 04/01/1955  REASON FOR VISIT: follow up HISTORY FROM: patient  No chief complaint on file.    HISTORY OF PRESENT ILLNESS:  10/25/21 ALL: Savannah Taylor returns for follow up for migraines. We switched her from [place] to [place] in 06/2021.   HST did not show any significant sleep breathing disorder.   06/29/2021 ALL: Savannah Taylor returns for follow up. Savannah Taylor was last seen 01/2020. Savannah Taylor was educated on the importance of consistency with CGRP injections and advised to consider sleep study. Since, Savannah Taylor reports headaches were well managed. Savannah Taylor has continued Amovig consistently every month. Savannah Taylor has had a milder nagging headache for the past three weeks. Nurtec is not helping. Savannah Taylor has been monitoring her BP at home. Readings having been 140's-150's/80-90. Savannah Taylor was started on losartan '50mg'$  QD about 2 weeks ago.   Medications tried and failed: Amovig, Topamax, verapamil, amitriptyline, Depakote, Inderal, Effexor, Cymbalta, Nurtec, Ubrelvy, Imitrex injections, Maxalt, Neurontin and Botox.   Exp 11/2024 Lot [id number]  01/07/2020 ALL:  Savannah Taylor returns today for migraine follow up. Savannah Taylor was not taking Amovig consistently at last visit. Savannah Taylor was reeducated on dosing and advised to take 1 injection every month. Savannah Taylor took injections Oct, Nov and Dec. Savannah Taylor does feel that [place] works to help reduce frequency and intensity of migraines for about 2 weeks. Week 3 and 4 Savannah Taylor has more headaches. We also started Nurtec for abortive therapy. Savannah Taylor reports that it works well to decrease intensity but does not completely relieve headache. Savannah Taylor called mid December reporting an intractable migraine. Depacon infusion provided some benefit but headache returned shortly after infusion. We had her take Nurtec daily for three days and started steroid taper. Savannah Taylor reports that Savannah Taylor continues to have headache. Pain is across the front of her head (more so on left). It is described as a dull swollen/tight feeling.   We have  discussed concerns of sleep apnea. Savannah Taylor does not sleep well. Savannah Taylor wakes up multiple times a night. Savannah Taylor often wakes with headaches. Savannah Taylor does snore. Savannah Taylor never feels rested when Savannah Taylor wakes. Savannah Taylor has been hesitant to consider workup due to financial concerns.   We received a referral from PCP for chronic muscle cramps. Cramps are generalized and effect upper and lower extremities. PCP performed lab eval that did not reveal any significant etiology for symptoms. Savannah Taylor has had tingling/pins needles sensation for over a year. Sensation comes and goes. Savannah Taylor feels both legs are numb from tips of her toes to the knee on both sides but worse on the left. Savannah Taylor tried gabapentin but was unable to tolerate due to brain fog. Savannah Taylor takes '600mg'$  1-2 times weekly as needed. Savannah Taylor reports that Savannah Taylor still is not recovered from lumbar fusion surgery in 01/2019. Robaxin prescribed by neurosurgery. Savannah Taylor was referred to Dr Brien Few for pain management. Savannah Taylor reports having a normal NCS/EMG many years ago. Savannah Taylor reports that Savannah Taylor cant hold her neck in certain positions or her neck with lock up. Savannah Taylor feels that her hands lock up if Savannah Taylor tries to hold anything for any period of time. Savannah Taylor has spasms of her legs. No incontinence.   Savannah Taylor is taking oxycodone as needed for back pain. Savannah Taylor denies daily use. Savannah Taylor has filled 30 tablets on 11/17/2019 and 12/14/2019. Savannah Taylor also has alprazolam and clonazepam on her medication list. Savannah Taylor is not certain how often Savannah Taylor takes either of these. Per PDMP, Savannah Taylor alternates filling alprazolam and clonazepam. Savannah Taylor reports that Savannah Taylor has ongoing depression and anxiety. Savannah Taylor  was taking Wellbutrin at last visit but reports that Savannah Taylor has stopped this medicaiton sometime since last being seen. Savannah Taylor is not sure when but didn't like how Savannah Taylor felt on it.    History (copied from previous notes)  Savannah Taylor is a 66 y.o. female here today for follow up migraines. Savannah Taylor continues to have regular headaches. Savannah Taylor has not reached out to [place] to report  headaches have continues. Savannah Taylor has some sort of headache every day. Savannah Taylor has at least 16-20 migraines every month. Savannah Taylor reports taking Amovig every 3 months. Reports that Savannah Taylor has only gotten 1 injection every three months. Amovig helps significantly after injection. Savannah Taylor does not use abortive medication. Savannah Taylor did not feel Ubrelvy helped. Savannah Taylor did not continue venlafaxine. Savannah Taylor recently lost her mother. Savannah Taylor continues to grieve her loss. Savannah Taylor cries frequently. PCP has been treating depression with Wellbutrin but Savannah Taylor hs not taken consistently. Savannah Taylor is not sleeping well. Savannah Taylor is always tired. Savannah Taylor wakes frequently at night with headaches or to use the bathroom. Savannah Taylor does snore. We have discussed concerns of sleep apnea in the past but Savannah Taylor has been reluctant to consider workup. Her son has sleep apnea.    HISTORY: (copied from my note on 07/30/2018)  Savannah Taylor is a 66 y.o. female here today for follow up for migraines and concerns of sleep apnea. Savannah Taylor reports that headaches are unchanged. Savannah Taylor continues to have frequent migraines but feels they are not as intense. Savannah Taylor is caring for her mother with end stage dementia and cancer. Savannah Taylor is taking venlafaxine '75mg'$  sporadically when Savannah Taylor feels Savannah Taylor needs it. Savannah Taylor has taken Ubrelvy once but is insure if it helped.    Savannah Taylor has not followed through with sleep apnea evaluation. Savannah Taylor was uncertain if insurance would cover it. Savannah Taylor is interested in continuing with evaluation.    HISTORY: (copied from my note on 03/27/2018)   Savannah Taylor is a 66 y.o. female for follow up. Savannah Taylor is taking topiramate '100mg'$  daily and Amovig '140mg'$  monthly for management of migraines.  Savannah Taylor reports that her headaches continue to occur daily.  Savannah Taylor has mild pain with most headaches but does report at least 10 migraines per month.  Migraines are typically unilateral and associated with nausea, light and sound sensitivity.  Last month Savannah Taylor took an extra dose of Aimovig and reports that that had helped some.  Savannah Taylor  states that the first 2 to 3 weeks after taking Aimovig Savannah Taylor feels that Savannah Taylor is well managed, however, the next week or so her migraines are pretty bad.  Savannah Taylor is taking Zomig for abortive therapy but reports it is no longer helping.  Savannah Taylor has been on multiple preventative medications including Topamax, verapamil, amitriptyline, Depakote, Inderal, Imitrex injections, Maxalt, Neurontin and Botox.  Savannah Taylor is also tried Effexor and Cymbalta.  Headaches have not changed in nature.  Last imaging reviewed was a CT of her head in 2014 that was unremarkable.   Savannah Taylor reports about 10 years ago Savannah Taylor had a work-up for sleep apnea.  Savannah Taylor is unsure of the results of her test but does mention that Savannah Taylor was told if Savannah Taylor ever reaches a deep stage of sleep.  Savannah Taylor does endorse snoring, frequent waking, nocturia, dry mouth, occasional morning headaches, daytime sleepiness and fatigue.  Savannah Taylor is interested in an updated sleep study.     History (copied from [employer] note dated 06/18/17)    Ms. Insco is a 66 year old female with a history of migraine.  Savannah Taylor states that Savannah Taylor had a severe headache for the last 2 to 3 weeks.  Savannah Taylor is on last day of her prednisone Dosepak.  Reports prednisone has been beneficial.  Reports that Savannah Taylor still has a mild headache but not as severe as it was before.  Savannah Taylor states that her headaches typically occur on the left side.  Savannah Taylor always has phonophobia with her headaches.  Savannah Taylor remains on Topamax.  Savannah Taylor also uses Zomig as needed.  Savannah Taylor reports that [place] worked well for her migraines in the first 2 months.  Savannah Taylor states by the third month Savannah Taylor began having a constant dull headache.  Savannah Taylor is currently taking the 70 mg injection.  Savannah Taylor returns today for evaluation.   HISTORY:Savannah Taylor is a 66 y.o. female here as a referral from Dr. Justin Mend for migraines. Past medical history of chronic migraines, depression, hypertension, high cholesterol, B12 deficiency, chronic insomnia, chronic kidney disease, multilevel  degenerative disease. Savannah Taylor has been seeing Dr. Domingo Cocking for nerve injections. Migraines since a teenager without any inciting event or head trauma. Savannah Taylor has a family history of migraines, Her son and paternal aunt have migraines. Savannah Taylor has been having injections of the headache wellness Center, Savannah Taylor went every month and then every 2 months (likely nerve block) but Savannah Taylor also says that Savannah Taylor received Botox which helped but insurance would not pay anymore. Now Savannah Taylor just goes as needed. For nerve blocks. Savannah Taylor has headaches 15 days a month. 8-10 are migrainous. This is been ongoing for the last 6 months. Her migraines feele like her head is swelling on one side, but they can move around, one eye droops, feels tight, Savannah Taylor can hear her heart beating in her head especially when laying down, more pounding than pressure, Savannah Taylor can't stand sound or noise. Dark rooms help. Being quiet helps. They can last for days straight. They can be severe with nausea and vomiting on average out of 10 can be 6-7/10. Savannah Taylor has been to "so many doctors and so many places" and the doctors get frustrated because they can't help. Savannah Taylor has been to headaches clinics. Savannah Taylor was told Savannah Taylor was faking in the past. Savannah Taylor has tried everything per patient. No other associated symptoms or focal neurologic deficits or complaints. No aura. No medication overuse.    Tried: Topamax, verapamil, amitriptyline, depakote, inderal, Savannah Taylor has had imitrex injections, maxalt, zomig. Neurontin, botox helped a lot but then her physician said insurance wouldn;t pay, effexor, cymbalta.    Reviewed notes, labs and imaging from outside physicians, which showed:   BUN 12 and creatinine 1.15 taken 04/05/2015. TSH 0.810 January 2017.   CT of the head 04/2012 showed No acute intracranial abnormalities including mass lesion or mass effect, hydrocephalus, extra-axial fluid collection, midline shift, hemorrhage, or acute infarction, large ischemic events (personally reviewed images)    REVIEW  OF SYSTEMS: Out of a complete 14 system review of symptoms, the patient complains only of the following symptoms, headaches, depression, insomnia, snoring, muscle cramps, chronic pain and all other reviewed systems are negative.   ALLERGIES: Allergies  Allergen Reactions   Bystolic [Nebivolol Hcl] Palpitations and Other (See Comments)   Morphine And Related Itching   Naproxen Hives and Itching   Sertraline Hcl Other (See Comments)    hallucinations   Topiramate Other (See Comments)    Apathetic "out of it"   Venlafaxine Hcl Er Other (See Comments)    Hair loss    HOME MEDICATIONS: Outpatient Medications Prior to Visit  Medication  Sig Dispense Refill   amLODipine (NORVASC) 5 MG tablet Take 5 mg by mouth in the morning and at bedtime.     Cholecalciferol (VITAMIN D3 PO) Take 5,000 mg by mouth daily.     Fremanezumab-vfrm (AJOVY) 225 MG/1.5ML SOAJ Inject 225 mg into the skin every 30 (thirty) days. 4.5 mL 3   Magnesium 250 MG TABS Take 1 tablet by mouth daily.     metoprolol tartrate (LOPRESSOR) 100 MG tablet Take 1 tablet (100 mg total) by mouth once for 1 dose. Take 90-120 minutes prior to scan. 1 tablet 0   omeprazole (PRILOSEC) 20 MG capsule Take 1 capsule by mouth 2 (two) times daily. Morning and evening     Rimegepant Sulfate (NURTEC) 75 MG TBDP Take 75 mg by mouth daily as needed (take for abortive therapy of migraine, no more than 1 tablet in 24 hours or 10 per month). 8 tablet 11   traMADol (ULTRAM) 50 MG tablet Take 50 mg by mouth every 6 (six) hours as needed for moderate pain.     No facility-administered medications prior to visit.    PAST MEDICAL HISTORY: Past Medical History:  Diagnosis Date   Abdominal cramps    Asthma    Back problem    BAD DISK IN BACK   DDD (degenerative disc disease), lumbosacral    Depression    Gastritis    GERD (gastroesophageal reflux disease)    H/O hiatal hernia    Heart murmur    Years ago   Hyperlipidemia    Hypertension     Insomnia    Knee problem    Leg cramps    Migraine    01/22/2019- last one > 1 month ago   Post concussion syndrome    Seizure ([place])    1 many years ago with a migraine   Sleep apnea    does not use CPAP   Stroke ([place])    TIA  slight memory  issues TIA prior to 2010   Ulcer    STOMACH & SORENESS & PAIN   UTI (urinary tract infection) 07/02/2012    PAST SURGICAL HISTORY: Past Surgical History:  Procedure Laterality Date   ABDOMINAL HYSTERECTOMY     ARTHROGRAM KNEE Right    MCL tear   BACK SURGERY     PLIF L4-5, 5-S1 Dr. Ronnald Ramp 01-2019   BREAST REDUCTION SURGERY Bilateral 05/10/2021   Procedure: MAMMARY REDUCTION  (BREAST);  Surgeon: Wallace Going, DO;  Location: [facility];  Service: Plastics;  Laterality: Bilateral;   COLONOSCOPY     ENDOSCOPY  2020   hiatal hernia, gastritis, and some bacteria in stomach   HEMORRHOID SURGERY      FAMILY HISTORY: Family History  Problem Relation Age of Onset   Hypertension Mother    Hyperlipidemia Mother    Asthma Father    Cancer Father    Hypertension Brother    Sleep apnea Son    Breast cancer Neg Hx     SOCIAL HISTORY: Social History   Socioeconomic History   Marital status: Married    Spouse name: Eddie   Number of children: 3   Years of education: Some college   Highest education level: Not on file  Occupational History   Occupation: works full time  Tobacco Use   Smoking status: Never   Smokeless tobacco: Never  Vaping Use   Vaping Use: Never used  Substance and Sexual Activity   Alcohol use: No   Drug  use: No   Sexual activity: Not Currently    Birth control/protection: Surgical  Other Topics Concern   Not on file  Social History Narrative   Lives with husband   Caffeine use: Coffee/tea/soda sometimes per patient   Social Determinants of Health   Financial Resource Strain: Not on file  Food Insecurity: No Food Insecurity (09/13/2021)   Hunger Vital Sign    Worried About Running Out  of Food in the Last Year: Never true    Ran Out of Food in the Last Year: Never true  Transportation Needs: No Transportation Needs (09/13/2021)   PRAPARE - Hydrologist (Medical): No    Lack of Transportation (Non-Medical): No  Physical Activity: Not on file  Stress: Not on file  Social Connections: Not on file  Intimate Partner Violence: Not on file      PHYSICAL EXAM  There were no vitals filed for this visit.   There is no height or weight on file to calculate BMI.  Generalized: Well developed, in no acute distress  Cardiology: normal rate and rhythm, no murmur noted Respiratory: clear to auscultation bilaterally  Neurological examination  Mentation: Alert oriented to time, place, history taking. Follows all commands speech and language fluent Cranial nerve II-XII: Pupils were equal round reactive to light. Extraocular movements were full, visual field were full  Motor: The motor testing reveals 5 over 5 strength of all 4 extremities. Good symmetric motor tone is noted throughout.  Gait and station: Gait is normal.      DIAGNOSTIC DATA (LABS, IMAGING, TESTING) - I reviewed patient records, labs, notes, testing and imaging myself where available.      No data to display           Lab Results  Component Value Date   WBC 6.9 07/07/2020   HGB 13.5 07/07/2020   HCT 40.6 07/07/2020   MCV 89.8 07/07/2020   PLT 333 07/07/2020      Component Value Date/Time   NA 140 07/07/2020 1750   NA 142 08/31/2015 1313   K 3.7 07/07/2020 1750   CL 104 07/07/2020 1750   CO2 27 07/07/2020 1750   GLUCOSE 100 (H) 07/07/2020 1750   BUN 11 07/07/2020 1750   BUN 12 08/31/2015 1313   CREATININE 0.95 07/07/2020 1750   CALCIUM 9.9 07/07/2020 1750   PROT 7.1 08/31/2015 1313   ALBUMIN 4.4 08/31/2015 1313   AST 14 08/31/2015 1313   ALT 9 08/31/2015 1313   ALKPHOS 74 08/31/2015 1313   BILITOT 0.3 08/31/2015 1313   GFRNONAA >60 07/07/2020 1750   GFRAA >60  01/22/2019 1030   No results found for: "CHOL", "HDL", "LDLCALC", "LDLDIRECT", "TRIG", "CHOLHDL" No results found for: "HGBA1C" No results found for: "VITAMINB12" No results found for: "TSH"     ASSESSMENT AND PLAN 66 y.o. year old female  has a past medical history of Abdominal cramps, Asthma, Back problem, DDD (degenerative disc disease), lumbosacral, Depression, Gastritis, GERD (gastroesophageal reflux disease), H/O hiatal hernia, Heart murmur, Hyperlipidemia, Hypertension, Insomnia, Knee problem, Leg cramps, Migraine, Post concussion syndrome, Seizure ([place]), Sleep apnea, Stroke ([place]), Ulcer, and UTI (urinary tract infection) (07/02/2012). here with   No diagnosis found.   Savannah Taylor cwas previously doing well on Amovig but recently noted to have more tension style headaches. BP has been elevated. We will switch her to Ajovy every 30 days. One sample pen given in office, today. Discontinue Amovig. Savannah Taylor will continue Nurtec for migraine abortion  but advised it may not help with tension headaches. Savannah Taylor may use Tylenol and Benadryl. Avoid Nsaids d/t stomach ulcer. Triptan not effective. May consider nasal CGRP in future if needed. Savannah Taylor was encouraged to contact PCP asap regarding BP readings. Savannah Taylor has not take her HTN agent today. Savannah Taylor was encouraged to take as soon as Savannah Taylor gets home and monitor BP. Savannah Taylor will focus on healthy lifestyle habits as discussed.  We will plan to follow-up in 4-6 months pending further work-up.  May adjust as needed.  Savannah Taylor verbalizes understanding and agreement with this plan.   No orders of the defined types were placed in this encounter.    No orders of the defined types were placed in this encounter.     Debbora Presto, FNP-C 10/25/2021, 12:49 PM [facility] [address] [place], [place] [address] [phone]

## 2021-10-26 ENCOUNTER — Ambulatory Visit: Payer: Medicare HMO | Admitting: Family Medicine

## 2021-10-26 ENCOUNTER — Encounter: Payer: Self-pay | Admitting: Family Medicine

## 2021-10-26 VITALS — BP 118/80 | HR 83 | Ht 65.0 in | Wt 191.8 lb

## 2021-10-26 DIAGNOSIS — G43719 Chronic migraine without aura, intractable, without status migrainosus: Secondary | ICD-10-CM

## 2021-10-26 MED ORDER — QULIPTA 60 MG PO TABS: 60.0000 mg | ORAL_TABLET | Freq: Every day | ORAL | 3 refills | Status: DC

## 2021-10-26 MED ORDER — PREDNISONE 10 MG (21) PO TBPK: ORAL_TABLET | ORAL | 0 refills | Status: DC

## 2021-10-26 MED ORDER — NURTEC 75 MG PO TBDP: 75.0000 mg | ORAL_TABLET | Freq: Every day | ORAL | 11 refills | Status: DC | PRN

## 2021-10-27 ENCOUNTER — Telehealth (HOSPITAL_COMMUNITY): Payer: Self-pay | Admitting: Emergency Medicine

## 2021-10-27 NOTE — Telephone Encounter (Signed)
Attempted to call patient regarding upcoming cardiac CT appointment. °Left message on voicemail with name and callback number °Teon Hudnall RN Navigator Cardiac Imaging °[facility] °[phone] Office °[phone] Cell ° °

## 2021-10-30 ENCOUNTER — Ambulatory Visit (HOSPITAL_BASED_OUTPATIENT_CLINIC_OR_DEPARTMENT_OTHER): Payer: Medicare HMO

## 2021-10-30 ENCOUNTER — Ambulatory Visit (HOSPITAL_COMMUNITY)
Admission: RE | Admit: 2021-10-30 | Discharge: 2021-10-30 | Disposition: A | Discharge status: Home / Self Care | Payer: Medicare HMO | Source: Ambulatory Visit | Attending: Cardiovascular Disease | Admitting: Cardiovascular Disease

## 2021-10-30 DIAGNOSIS — I700 Atherosclerosis of aorta: Secondary | ICD-10-CM

## 2021-10-30 DIAGNOSIS — R079 Chest pain, unspecified: Secondary | ICD-10-CM

## 2021-10-30 DIAGNOSIS — R0609 Other forms of dyspnea: Secondary | ICD-10-CM | POA: Diagnosis present

## 2021-10-30 DIAGNOSIS — R072 Precordial pain: Secondary | ICD-10-CM | POA: Insufficient documentation

## 2021-10-30 LAB — ECHOCARDIOGRAM COMPLETE
Area-P 1/2: 3.68 cm2
S' Lateral: 2.9 cm

## 2021-10-30 MED ORDER — NITROGLYCERIN 0.4 MG SL SUBL
SUBLINGUAL_TABLET | SUBLINGUAL | Status: AC
  Filled 2021-10-30: qty 2

## 2021-10-30 MED ORDER — IOHEXOL 350 MG/ML SOLN
95.0000 mL | Freq: Once | INTRAVENOUS | Status: AC | PRN
  Administered 2021-10-30: 95 mL via INTRAVENOUS

## 2021-10-30 MED ORDER — NITROGLYCERIN 0.4 MG SL SUBL
0.8000 mg | SUBLINGUAL_TABLET | Freq: Once | SUBLINGUAL | Status: AC
Start: 2021-10-30 — End: 2021-10-30
  Administered 2021-10-30: 0.8 mg via SUBLINGUAL

## 2021-10-31 ENCOUNTER — Telehealth: Payer: Self-pay | Admitting: *Deleted

## 2021-10-31 ENCOUNTER — Ambulatory Visit: Payer: Medicare HMO | Admitting: Family Medicine

## 2021-10-31 NOTE — Telephone Encounter (Signed)
Submitted PA Qulipta on CMM. Key: [id number]. Waiting on determination from [facility].

## 2021-10-31 NOTE — Telephone Encounter (Signed)
PA approved 01/01/2021 - 01/01/2023.

## 2021-11-01 ENCOUNTER — Other Ambulatory Visit: Payer: Self-pay | Admitting: *Deleted

## 2021-11-01 ENCOUNTER — Telehealth: Payer: Self-pay | Admitting: Pharmacist

## 2021-11-01 NOTE — Progress Notes (Signed)
Aspirin recommended by [name] for cardiac CTA results.  Will route to PharmD for alert to allergy to Naproxen

## 2021-11-09 NOTE — Progress Notes (Signed)
Called patient, to learn about what type of reaction she had from Naproxen and/or other NSAIDs. Patient was driving so requested to  call back tomorrow between 11 and noon.  Will call per patient's request.

## 2021-11-14 ENCOUNTER — Ambulatory Visit: Payer: Medicare HMO | Admitting: Plastic Surgery

## 2021-11-14 ENCOUNTER — Encounter: Payer: Self-pay | Admitting: Plastic Surgery

## 2021-11-14 ENCOUNTER — Ambulatory Visit (HOSPITAL_BASED_OUTPATIENT_CLINIC_OR_DEPARTMENT_OTHER)
Admission: RE | Admit: 2021-11-14 | Discharge: 2021-11-14 | Disposition: A | Discharge status: Home / Self Care | Payer: Medicare HMO | Source: Ambulatory Visit | Attending: Plastic Surgery | Admitting: Plastic Surgery

## 2021-11-14 VITALS — BP 126/82 | HR 94

## 2021-11-14 DIAGNOSIS — R0789 Other chest pain: Secondary | ICD-10-CM

## 2021-11-14 DIAGNOSIS — L905 Scar conditions and fibrosis of skin: Secondary | ICD-10-CM

## 2021-11-14 DIAGNOSIS — N62 Hypertrophy of breast: Secondary | ICD-10-CM | POA: Diagnosis not present

## 2021-11-14 DIAGNOSIS — R079 Chest pain, unspecified: Secondary | ICD-10-CM | POA: Diagnosis not present

## 2021-11-14 NOTE — Progress Notes (Signed)
   Subjective:    Patient ID: Savannah Taylor, female    DOB: 06-08-1955, 67 y.o.   MRN: [id number]  The patient is a 66 year old female here for follow-up on her breast reduction.  She underwent reduction of bilateral breasts in May 2023 and had over 800 g removed from both breasts.  She is very happy with her results.  She has 2 areas where the soft tissue is a little bit firm 1 on the right breast laterally and one on the left breast laterally.  She has been trying to massage the areas and they have gotten a little bit better.  But her main concern today is tenderness in the mid axillary line on both breasts but certainly worse on the right.  She is tender what feels to be really on the ribs.      Review of Systems  Constitutional: Negative.   HENT: Negative.    Eyes: Negative.   Respiratory: Negative.    Cardiovascular: Negative.   Gastrointestinal: Negative.   Endocrine: Negative.   Genitourinary: Negative.   Musculoskeletal: Negative.        Objective:   Physical Exam Vitals and nursing note reviewed.  Constitutional:      Appearance: Normal appearance.  HENT:     Head: Normocephalic and atraumatic.  Cardiovascular:     Rate and Rhythm: Normal rate.     Pulses: Normal pulses.  Pulmonary:     Effort: Pulmonary effort is normal.  Skin:    General: Skin is warm.     Capillary Refill: Capillary refill takes less than 2 seconds.     Coloration: Skin is not jaundiced.     Findings: No bruising or lesion.  Neurological:     Mental Status: She is alert and oriented to person, place, and time.  Psychiatric:        Mood and Affect: Mood normal.        Behavior: Behavior normal.        Thought Content: Thought content normal.        Assessment & Plan:     ICD-10-CM   1. Symptomatic mammary hypertrophy  N62        I would like to start with a chest x-ray to make sure there is not anything going on with the ribs.  If that is negative we may need to go ahead and move  to an ultrasound and see what that shows.  PT might be necessary as well.  If I do not see anything after the ultrasound then she is likely going to need to see her PCP for this.  I do not think it is related to her breast.  I went ahead and did a Kenalog injection in both breasts where the firm tissue was located total of 0.2 cc of Kenalog was used on each side using the Kenalog 10.  Pictures were obtained of the patient and placed in the chart with the patient's or guardian's permission.

## 2021-11-15 MED ORDER — ASPIRIN 81 MG PO TBEC: 81.0000 mg | DELAYED_RELEASE_TABLET | Freq: Every day | ORAL | 3 refills | Status: AC

## 2021-11-15 NOTE — Progress Notes (Signed)
Called patient to clarify naproxen allergy- states she only had hives from it no other symptoms like angioedema, anaphylactic reaction or worsening of  asthma. Patient had bought low dose ASA OTC and have been on at least for 1 week now She does not have any hives or rashes from low dose ASA.

## 2021-11-27 ENCOUNTER — Encounter: Payer: Self-pay | Admitting: Cardiovascular Disease

## 2021-11-27 ENCOUNTER — Ambulatory Visit: Payer: Medicare HMO | Attending: Cardiovascular Disease | Admitting: Cardiovascular Disease

## 2021-11-27 VITALS — BP 110/76 | HR 88 | Ht 65.0 in | Wt 188.8 lb

## 2021-11-27 DIAGNOSIS — I2510 Atherosclerotic heart disease of native coronary artery without angina pectoris: Secondary | ICD-10-CM | POA: Diagnosis not present

## 2021-11-27 DIAGNOSIS — R079 Chest pain, unspecified: Secondary | ICD-10-CM | POA: Diagnosis not present

## 2021-11-27 NOTE — Patient Instructions (Signed)
Medication Instructions:  Your physician recommends that you continue on your current medications as directed. Please refer to the Current Medication list given to you today.  *If you need a refill on your cardiac medications before your next appointment, please call your pharmacy*   Lab Work: None ordered   If you have labs (blood work) drawn today and your tests are completely normal, you will receive your results only by: [place] (if you have MyChart) OR A paper copy in the mail If you have any lab test that is abnormal or we need to change your treatment, we will call you to review the results.   Testing/Procedures: None ordered    Follow-Up: At [facility], you and your health needs are our priority.  As part of our continuing mission to provide you with exceptional heart care, we have created designated Provider Care Teams.  These Care Teams include your primary Cardiologist (physician) and Advanced Practice Providers (APPs -  Physician Assistants and Nurse Practitioners) who all work together to provide you with the care you need, when you need it.  We recommend signing up for the patient portal called "MyChart".  Sign up information is provided on this After Visit Summary.  MyChart is used to connect with patients for Virtual Visits (Telemedicine).  Patients are able to view lab/test results, encounter notes, upcoming appointments, etc.  Non-urgent messages can be sent to your provider as well.   To learn more about what you can do with MyChart, go to [email or web address].    Your next appointment:   12 month(s)  The format for your next appointment:   In Person  Provider:   Dr. Lauree Chandler   Other Instructions   Important Information About Sugar

## 2021-11-27 NOTE — Progress Notes (Signed)
Chief Complaint  Patient presents with   Follow-up    CAD   History of Present Illness: 66 yo female with history of depression, GERD, hiatal hernia, HTN, HLD, prior TIA and sleep apnea who is here today for cardiac follow up.  I saw her as a new consult on 10/16/21 for the evaluation of near syncope and abnormal EKG. EKG in primary care with sinus rhythm and poor R wave progression through the precordial leads. She described dizziness for the past few months. She feels dizzy almost every day. No syncope. She has pressure in her chest. This is at rest but does worsen with exertion. She also has dyspnea on exertion. Coronary CTA on 10/30/21 with mild plaque in the Circumflex, calcium score of 16. Mild aortic atherosclerosis. Echo 10/30/21 with LVEF=60-65%. Mild mitral regurgitation.   She is here today for follow up. The patient denies any chest pain, dyspnea, palpitations, lower extremity edema, orthopnea, PND, near syncope or syncope. She continues to have episodes of dizziness when she moves her head too quickly.   Primary Care Physician: Saintclair Halsted, FNP   Past Medical History:  Diagnosis Date   Abdominal cramps    Asthma    Back problem    BAD DISK IN BACK   DDD (degenerative disc disease), lumbosacral    Depression    Gastritis    GERD (gastroesophageal reflux disease)    H/O hiatal hernia    Heart murmur    Years ago   Hyperlipidemia    Hypertension    Insomnia    Knee problem    Leg cramps    Migraine    01/22/2019- last one > 1 month ago   Post concussion syndrome    Seizure ([place])    1 many years ago with a migraine   Sleep apnea    does not use CPAP   Stroke ([place])    TIA  slight memory  issues TIA prior to 2010   Ulcer    STOMACH & SORENESS & PAIN   UTI (urinary tract infection) 07/02/2012    Past Surgical History:  Procedure Laterality Date   ABDOMINAL HYSTERECTOMY     ARTHROGRAM KNEE Right    MCL tear   BACK SURGERY     PLIF L4-5, 5-S1 Dr. Ronnald Ramp  01-2019   BREAST REDUCTION SURGERY Bilateral 05/10/2021   Procedure: MAMMARY REDUCTION  (BREAST);  Surgeon: Wallace Going, DO;  Location: [facility];  Service: Plastics;  Laterality: Bilateral;   COLONOSCOPY     ENDOSCOPY  2020   hiatal hernia, gastritis, and some bacteria in stomach   HEMORRHOID SURGERY      Current Outpatient Medications  Medication Sig Dispense Refill   amLODipine (NORVASC) 5 MG tablet Take 5 mg by mouth in the morning and at bedtime.     aspirin EC 81 MG tablet Take 1 tablet (81 mg total) by mouth daily. Swallow whole. 90 tablet 3   Atogepant (QULIPTA) 60 MG TABS Take 60 mg by mouth daily. 90 tablet 3   atorvastatin (LIPITOR) 10 MG tablet Take 5 mg by mouth at bedtime.     Cholecalciferol (VITAMIN D3 PO) Take 5,000 mg by mouth daily.     Cyanocobalamin (B-12 PO) Take 5,000 mcg by mouth daily.     Magnesium 250 MG TABS Take 1 tablet by mouth daily.     Rimegepant Sulfate (NURTEC) 75 MG TBDP Take 75 mg by mouth daily as needed (take for  abortive therapy of migraine, no more than 1 tablet in 24 hours or 10 per month). 8 tablet 11   traMADol (ULTRAM) 50 MG tablet Take 50 mg by mouth every 6 (six) hours as needed for moderate pain.     traZODone (DESYREL) 50 MG tablet Take 50 mg by mouth at bedtime as needed.     No current facility-administered medications for this visit.    Allergies  Allergen Reactions   Bystolic [Nebivolol Hcl] Palpitations and Other (See Comments)   Morphine And Related Itching   Naproxen Hives and Itching   Sertraline Hcl Other (See Comments)    hallucinations   Topiramate Other (See Comments)    Apathetic "out of it"   Venlafaxine Hcl Er Other (See Comments)    Hair loss    Social History   Socioeconomic History   Marital status: Married    Spouse name: Eddie   Number of children: 3   Years of education: Some college   Highest education level: Not on file  Occupational History   Occupation: works full time   Tobacco Use   Smoking status: Never   Smokeless tobacco: Never  Vaping Use   Vaping Use: Never used  Substance and Sexual Activity   Alcohol use: No   Drug use: No   Sexual activity: Not Currently    Birth control/protection: Surgical  Other Topics Concern   Not on file  Social History Narrative   Lives with husband   Caffeine use: Coffee/tea/soda sometimes per patient   Social Determinants of Health   Financial Resource Strain: Not on file  Food Insecurity: No Food Insecurity (09/13/2021)   Hunger Vital Sign    Worried About Running Out of Food in the Last Year: Never true    Ran Out of Food in the Last Year: Never true  Transportation Needs: No Transportation Needs (09/13/2021)   PRAPARE - Hydrologist (Medical): No    Lack of Transportation (Non-Medical): No  Physical Activity: Not on file  Stress: Not on file  Social Connections: Not on file  Intimate Partner Violence: Not on file    Family History  Problem Relation Age of Onset   Hypertension Mother    Hyperlipidemia Mother    Asthma Father    Cancer Father    Hypertension Brother    Sleep apnea Son    Breast cancer Neg Hx     Review of Systems:  As stated in the HPI and otherwise negative.   BP 110/76   Pulse 88   Ht '5\' 5"'$  (1.651 m)   Wt 188 lb 12.8 oz (85.6 kg)   SpO2 97%   BMI 31.42 kg/m   Physical Examination: General: Well developed, well nourished, NAD  HEENT: OP clear, mucus membranes moist  SKIN: warm, dry. No rashes. Neuro: No focal deficits  Musculoskeletal: Muscle strength 5/5 all ext  Psychiatric: Mood and affect normal  Neck: No JVD, no carotid bruits, no thyromegaly, no lymphadenopathy.  Lungs:Clear bilaterally, no wheezes, rhonci, crackles Cardiovascular: Regular rate and rhythm. No murmurs, gallops or rubs. Abdomen:Soft. Bowel sounds present. Non-tender.  Extremities: No lower extremity edema. Pulses are 2 + in the bilateral DP/PT.  EKG:  EKG is not  ordered today. The ekg ordered today demonstrates   Echo 10/30/21:  1. Left ventricular ejection fraction, by estimation, is 60 to 65%. Left  ventricular ejection fraction by 3D volume is 60 %. The left ventricle has  normal function. The  left ventricle has no regional wall motion  abnormalities. Left ventricular diastolic   parameters were normal.   2. Right ventricular systolic function is normal. The right ventricular  size is normal. There is normal pulmonary artery systolic pressure.   3. The mitral valve is normal in structure. Mild mitral valve  regurgitation. No evidence of mitral stenosis.   4. The aortic valve is tricuspid. Aortic valve regurgitation is not  visualized. No aortic stenosis is present.   5. The inferior vena cava is normal in size with greater than 50%  respiratory variability, suggesting right atrial pressure of 3 mmHg.   Recent Labs: No results found for requested labs within last 365 days.   Lipid Panel No results found for: "CHOL", "TRIG", "HDL", "CHOLHDL", "VLDL", "LDLCALC", "LDLDIRECT"   Wt Readings from Last 3 Encounters:  11/27/21 188 lb 12.8 oz (85.6 kg)  10/26/21 191 lb 12.8 oz (87 kg)  10/16/21 191 lb 6.4 oz (86.8 kg)    Assessment and Plan:   1. Chest pain: her pain is not felt to be cardiac related. 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 Will continue ASA. She has been having muscle cramps since starting the Lipitor. She will hold the Lipitor for 2 weeks and call us back to let us know if her cramps resolved. If her cramps resolve off of Lipitor, will change to Crestor 10 mg daily. She may not tolerate statins and we would have to consider Repatha.   2. CAD without angina: Medical management of mild CAD with ASA and statin   3. Aortic atherosclerosis: Continue statin as tolerated.   Labs/ tests ordered today include:   No orders of the defined types were placed in this encounter.  Disposition:   F/U with me in one year  Signed, Lauree Chandler, MD, [facility] 11/27/2021 11:16 AM    [address], [place], [place]  [address] Phone: [phone]; Fax: [phone]

## 2021-11-28 ENCOUNTER — Ambulatory Visit: Payer: Medicare HMO | Admitting: Neurology

## 2021-11-28 ENCOUNTER — Telehealth: Payer: Self-pay | Admitting: Neurology

## 2021-11-28 ENCOUNTER — Encounter: Payer: Self-pay | Admitting: Psychology

## 2021-11-28 ENCOUNTER — Encounter: Payer: Self-pay | Admitting: Neurology

## 2021-11-28 VITALS — BP 114/68 | HR 79 | Ht 65.0 in | Wt 192.0 lb

## 2021-11-28 DIAGNOSIS — R4701 Aphasia: Secondary | ICD-10-CM | POA: Diagnosis not present

## 2021-11-28 DIAGNOSIS — E538 Deficiency of other specified B group vitamins: Secondary | ICD-10-CM

## 2021-11-28 DIAGNOSIS — R413 Other amnesia: Secondary | ICD-10-CM | POA: Diagnosis not present

## 2021-11-28 DIAGNOSIS — R69 Illness, unspecified: Secondary | ICD-10-CM | POA: Diagnosis not present

## 2021-11-28 DIAGNOSIS — Z818 Family history of other mental and behavioral disorders: Secondary | ICD-10-CM

## 2021-11-28 DIAGNOSIS — S0990XA Unspecified injury of head, initial encounter: Secondary | ICD-10-CM | POA: Diagnosis not present

## 2021-11-28 DIAGNOSIS — E519 Thiamine deficiency, unspecified: Secondary | ICD-10-CM | POA: Diagnosis not present

## 2021-11-28 NOTE — Patient Instructions (Addendum)
MRI brain Blood work Formal memory testing We will see what the results are then follow up  Orders Placed This Encounter  Procedures   MR BRAIN W WO CONTRAST   B12 and Folate Panel   Vitamin B1   TSH Rfx on Abnormal to Free T4   Basic Metabolic Panel   Methylmalonic acid, serum   RPR   Ambulatory referral to Neuropsychology     Memory Compensation Strategies  Use "WARM" strategy.  W= write it down  A= associate it  R= repeat it  M= make a mental note  2.   You can keep a Social worker.  Use a 3-ring notebook with sections for the following: calendar, important names and phone numbers,  medications, doctors' names/phone numbers, lists/reminders, and a section to journal what you did  each day.   3.    Use a calendar to write appointments down.  4.    Write yourself a schedule for the day.  This can be placed on the calendar or in a separate section of the Memory Notebook.  Keeping a  regular schedule can help memory.  5.    Use medication organizer with sections for each day or morning/evening pills.  You may need help loading it  6.    Keep a basket, or pegboard by the door.  Place items that you need to take out with you in the basket or on the pegboard.  You may also want to  include a message board for reminders.  7.    Use sticky notes.  Place sticky notes with reminders in a place where the task is performed.  For example: " turn off the  stove" placed by the stove, "lock the door" placed on the door at eye level, " take your medications" on  the bathroom mirror or by the place where you normally take your medications.  8.    Use alarms/timers.  Use while cooking to remind yourself to check on food or as a reminder to take your medicine, or as a  reminder to make a call, or as a reminder to perform another task, etc.

## 2021-11-28 NOTE — Telephone Encounter (Signed)
[employer] medicare sent to [facility] they obtain auth [id number] 
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
## 2021-11-29 ENCOUNTER — Encounter: Payer: Medicare HMO | Admitting: Physician Assistant

## 2021-11-30 DIAGNOSIS — D2361 Other benign neoplasm of skin of right upper limb, including shoulder: Secondary | ICD-10-CM | POA: Diagnosis not present

## 2021-11-30 DIAGNOSIS — L538 Other specified erythematous conditions: Secondary | ICD-10-CM | POA: Diagnosis not present

## 2021-11-30 DIAGNOSIS — R208 Other disturbances of skin sensation: Secondary | ICD-10-CM | POA: Diagnosis not present

## 2021-11-30 DIAGNOSIS — H02839 Dermatochalasis of unspecified eye, unspecified eyelid: Secondary | ICD-10-CM | POA: Diagnosis not present

## 2021-11-30 DIAGNOSIS — Z789 Other specified health status: Secondary | ICD-10-CM | POA: Diagnosis not present

## 2021-12-01 LAB — BASIC METABOLIC PANEL
BUN/Creatinine Ratio: 11 — ABNORMAL LOW (ref 12–28)
BUN: 12 mg/dL (ref 8–27)
CO2: 23 mmol/L (ref 20–29)
Calcium: 9.6 mg/dL (ref 8.7–10.3)
Chloride: 104 mmol/L (ref 96–106)
Creatinine, Ser: 1.06 mg/dL — ABNORMAL HIGH (ref 0.57–1.00)
Glucose: 88 mg/dL (ref 70–99)
Potassium: 4.5 mmol/L (ref 3.5–5.2)
Sodium: 140 mmol/L (ref 134–144)
eGFR: 58 mL/min/{1.73_m2} — ABNORMAL LOW (ref >59)

## 2021-12-01 LAB — B12 AND FOLATE PANEL
Folate: 14 ng/mL (ref >3.0)
Vitamin B-12: >2000 pg/mL — ABNORMAL HIGH (ref 232–1245)

## 2021-12-01 LAB — RPR: RPR Ser Ql: NONREACTIVE

## 2021-12-01 LAB — METHYLMALONIC ACID, SERUM: Methylmalonic Acid: 164 nmol/L (ref 0–378)

## 2021-12-01 LAB — VITAMIN B1: Thiamine: 103.4 nmol/L (ref 66.5–200.0)

## 2021-12-04 ENCOUNTER — Telehealth: Payer: Self-pay | Admitting: Neurology

## 2021-12-04 NOTE — Telephone Encounter (Signed)
I resulted them today see results on my chart thanks

## 2021-12-04 NOTE — Telephone Encounter (Signed)
Pt calling for her lab results

## 2021-12-04 NOTE — Telephone Encounter (Signed)
Pt is calling. Said she would like to follow-up on her lab results. Pt is requesting a call-back.

## 2021-12-07 DIAGNOSIS — R748 Abnormal levels of other serum enzymes: Secondary | ICD-10-CM | POA: Diagnosis not present

## 2021-12-11 ENCOUNTER — Ambulatory Visit
Admission: RE | Admit: 2021-12-11 | Discharge: 2021-12-11 | Disposition: A | Discharge status: Home / Self Care | Payer: Medicare HMO | Source: Ambulatory Visit | Attending: Neurology | Admitting: Neurology

## 2021-12-11 DIAGNOSIS — Z818 Family history of other mental and behavioral disorders: Secondary | ICD-10-CM

## 2021-12-11 DIAGNOSIS — R4701 Aphasia: Secondary | ICD-10-CM

## 2021-12-11 DIAGNOSIS — R413 Other amnesia: Secondary | ICD-10-CM | POA: Diagnosis not present

## 2021-12-11 DIAGNOSIS — S0990XA Unspecified injury of head, initial encounter: Secondary | ICD-10-CM

## 2021-12-11 MED ORDER — GADOPICLENOL 0.5 MMOL/ML IV SOLN
9.0000 mL | Freq: Once | INTRAVENOUS | Status: AC | PRN
  Administered 2021-12-11: 9 mL via INTRAVENOUS

## 2021-12-19 ENCOUNTER — Telehealth: Payer: Self-pay | Admitting: Cardiovascular Disease

## 2021-12-19 DIAGNOSIS — I2510 Atherosclerotic heart disease of native coronary artery without angina pectoris: Secondary | ICD-10-CM

## 2021-12-19 MED ORDER — ROSUVASTATIN CALCIUM 10 MG PO TABS: 10.0000 mg | ORAL_TABLET | Freq: Every day | ORAL | 3 refills | Status: DC

## 2021-12-19 NOTE — Telephone Encounter (Signed)
From ov with [name] 11/27/21:  1. Chest pain: her pain is not felt to be cardiac related. 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 Will continue ASA. She has been having muscle cramps since starting the Lipitor. She will hold the Lipitor for 2 weeks and call us back to let us know if her cramps resolved. If her cramps resolve off of Lipitor, will change to Crestor 10 mg daily. She may not tolerate statins and we would have to consider Repatha.    Patient calling in and reports cramping has gone completely away off Lipitor.  Sent prescription for Crestor 10 mg, scheduled lab appointment (lipids, liver) for in 12 weeks.  Asked her to stop medication and call if cramping returns on Crestor.  Pt voices understanding and agreement.

## 2021-12-19 NOTE — Telephone Encounter (Signed)
Pt c/o medication issue:  1. Name of Medication:   atorvastatin (LIPITOR) 10 MG tablet   2. How are you currently taking this medication (dosage and times per day)?  Stopped taking  3. Are you having a reaction (difficulty breathing--STAT)?   4. What is your medication issue?    Patient stated she is reporting to [name] regarding her results from stopping this medication.  Patient stated she is not having painful cramping in her feet, legs and hands anymore.  Patient stated she is not on a cholesterol medication at this time and would like to know her next steps.

## 2021-12-22 NOTE — Telephone Encounter (Signed)
Left Message - LVM to return the call; have question regarding naproxen allergic symptoms, what were the symptoms and how severe were they?

## 2022-01-05 ENCOUNTER — Telehealth: Payer: Self-pay | Admitting: Cardiovascular Disease

## 2022-01-05 DIAGNOSIS — Z789 Other specified health status: Secondary | ICD-10-CM

## 2022-01-05 NOTE — Telephone Encounter (Signed)
Spoke w the patient.  She had stopped atorvastatin due to hand/feet/leg cramping.  Switched to [employer] and same symptoms returned.  She will stop Crestor.  Medication placed on allergy list as intolerance.    Referral to PharmD for lipid management placed.  Pt aware she will be contacted to schedule.

## 2022-01-05 NOTE — Telephone Encounter (Signed)
Pt c/o medication issue:  1. Name of Medication: rosuvastatin (CRESTOR) 10 MG tablet   2. How are you currently taking this medication (dosage and times per day)? Take 1 tablet (10 mg total) by mouth daily. - Oral   3. Are you having a reaction (difficulty breathing--STAT)?   4. What is your medication issue? Pt states [name] changed her from atorvastatin to this medication due to cramping, however she is still cramping in her feet, legs, and hands. Please advise.

## 2022-01-09 NOTE — Progress Notes (Unsigned)
Patient ID: Savannah Taylor                 DOB: May 12, 1955                    MRN: [id number]     HPI: Savannah Taylor is a 67 y.o. female patient referred to lipid clinic by Dr Angelena Form. PMH is significant for Coronary CTA 10/30/21 with mild plaque in circumflex, calcium score of 16 (71st percentile) and mild aortic atherosclerosis, TIA, HTN, HLD, hiatal hernia, GERD, OSA, and depression. Last seen by Dr Angelena Form 11/2021. Chest pain not felt to be cardiac in nature. Was experiencing muscle cramps on Lipitor '10mg'$  and was advised to hold for 2 weeks. Her cramping resolved and she was changed to Crestor '10mg'$  daily which caused the same symptoms. She was referred to PharmD to discuss other options.  Pt experienced joint pain on 2 statins that resolved with statin d/c in each case. Has also taken ezetimibe and Welchol. Thinks one was stopped because it was ineffective. Has been making lifestyle changes and lost about 20 lbs.   Current Medications: none Intolerances: atorvastatin '10mg'$  daily, rosuvastatin '10mg'$  daily - cramping and hands, feet, and legs Risk Factors: elevated calcium score, TIA, baseline LDL > 190 LDL goal: '70mg'$ /dL  Diet: Cutting back on carbs and sweets. No fried food.  [facility] [facility] exercise bike and walking  Family History: Mother with HTN and HLD, father with asthma and cancer.  Social History: No alcohol, drug, or tobacco use.  Labs: 10/12/21: TC 284, TG 94, HDL 66, LDL 202, nonHDL 218  Past Medical History:  Diagnosis Date   Abdominal cramps    Asthma    Back problem    BAD DISK IN BACK   DDD (degenerative disc disease), lumbosacral    Depression    Gastritis    GERD (gastroesophageal reflux disease)    H/O hiatal hernia    Heart murmur    Years ago   Hyperlipidemia    Hypertension    Insomnia    Knee problem    Leg cramps    Migraine    01/22/2019- last one > 1 month ago   Post concussion syndrome    Seizure ([place])    1 many  years ago with a migraine   Sleep apnea    does not use CPAP   Stroke ([place])    TIA  slight memory  issues TIA prior to 2010   Ulcer    STOMACH & SORENESS & PAIN   UTI (urinary tract infection) 07/02/2012    Current Outpatient Medications on File Prior to Visit  Medication Sig Dispense Refill   amLODipine (NORVASC) 5 MG tablet Take 5 mg by mouth in the morning and at bedtime.     aspirin EC 81 MG tablet Take 1 tablet (81 mg total) by mouth daily. Swallow whole. 90 tablet 3   Atogepant (QULIPTA) 60 MG TABS Take 60 mg by mouth daily. 90 tablet 3   Cholecalciferol (VITAMIN D3 PO) Take 5,000 mg by mouth daily.     Cyanocobalamin (B-12 PO) Take 5,000 mcg by mouth daily.     Magnesium 250 MG TABS Take 1 tablet by mouth daily.     Rimegepant Sulfate (NURTEC) 75 MG TBDP Take 75 mg by mouth daily as needed (take for abortive therapy of migraine, no more than 1 tablet in 24 hours or 10 per month). 8 tablet  11   traMADol (ULTRAM) 50 MG tablet Take 50 mg by mouth every 6 (six) hours as needed for moderate pain.     traZODone (DESYREL) 50 MG tablet Take 50 mg by mouth at bedtime as needed.     No current facility-administered medications on file prior to visit.    Allergies  Allergen Reactions   Bystolic [Nebivolol Hcl] Palpitations and Other (See Comments)   Atorvastatin Other (See Comments)    Cramping in hands and feet   Morphine And Related Itching   Naproxen Hives and Itching   Sertraline Hcl Other (See Comments)    hallucinations   Topiramate Other (See Comments)    Apathetic "out of it"   Venlafaxine Hcl Er Other (See Comments)    Hair loss   Rosuvastatin Other (See Comments)    Cramping in hands and feet and leg muscles    Assessment/Plan:  1. Hyperlipidemia - Baseline LDL 202 above goal < 70 given aortic atherosclerosis noted on coronary CTA and hx of TIA. She is intolerant to 2 statins. Discussed PCSK9i including expected benefits, side effects, and injection technique. Pt  agreeable to try Repatha. Prior auth submitted and approved through 01/01/23. Pt advised to call clinic if issues with side effects or copay (anticipated $47/mo). Will recheck fasting labs in 2 months to assess efficacy.  Savannah Taylor Savannah Taylor, PharmD, BCACP, [facility]. [address] [place], [place] [address] Phone: [phone]; Fax: [phone] 01/10/2022 10:04 AM

## 2022-01-10 ENCOUNTER — Ambulatory Visit: Payer: Medicare HMO | Attending: Internal Medicine | Admitting: Pharmacist

## 2022-01-10 ENCOUNTER — Encounter: Payer: Self-pay | Admitting: Pharmacist

## 2022-01-10 DIAGNOSIS — R931 Abnormal findings on diagnostic imaging of heart and coronary circulation: Secondary | ICD-10-CM

## 2022-01-10 DIAGNOSIS — E782 Mixed hyperlipidemia: Secondary | ICD-10-CM | POA: Diagnosis not present

## 2022-01-10 DIAGNOSIS — I2510 Atherosclerotic heart disease of native coronary artery without angina pectoris: Secondary | ICD-10-CM | POA: Diagnosis not present

## 2022-01-10 DIAGNOSIS — E785 Hyperlipidemia, unspecified: Secondary | ICD-10-CM | POA: Insufficient documentation

## 2022-01-10 MED ORDER — REPATHA SURECLICK 140 MG/ML ~~LOC~~ SOAJ: 1.0000 | SUBCUTANEOUS | 11 refills | Status: DC

## 2022-01-10 NOTE — Patient Instructions (Addendum)
Your LDL cholesterol is 202 and your goal is < 70  Start Repatha injections once every 2 weeks in the fatty tissue of your stomach or upper outer thigh. Store the medication in the fridge. You can let your dose warm up to room temperature for 30 minutes before injecting if you prefer. Repatha will lower your LDL cholesterol by 60% and helps to lower your chance of having a heart attack or stroke.  Recheck fasting labs on Tuesday, March 12th  Call Jinny Blossom, PharmD with any concerns with side effects or cost [phone]

## 2022-01-12 ENCOUNTER — Other Ambulatory Visit (HOSPITAL_COMMUNITY): Payer: Self-pay

## 2022-01-12 DIAGNOSIS — E669 Obesity, unspecified: Secondary | ICD-10-CM | POA: Diagnosis not present

## 2022-01-12 DIAGNOSIS — G4700 Insomnia, unspecified: Secondary | ICD-10-CM | POA: Diagnosis not present

## 2022-01-12 DIAGNOSIS — I10 Essential (primary) hypertension: Secondary | ICD-10-CM | POA: Diagnosis not present

## 2022-01-31 ENCOUNTER — Telehealth: Payer: Self-pay | Admitting: Neurology

## 2022-01-31 ENCOUNTER — Encounter: Payer: Self-pay | Admitting: Neurology

## 2022-01-31 MED ORDER — METHYLPREDNISOLONE 4 MG PO TBPK: ORAL_TABLET | ORAL | 0 refills | Status: DC

## 2022-01-31 NOTE — Telephone Encounter (Signed)
Pt asking if can take Qulipta and Ajovy together. Had a couple of dose of Ajovy at home. Took both in November and December. Taking both help my migraine 90%. Would like a call from the nurse to discuss prescription for Ajovy.

## 2022-01-31 NOTE — Telephone Encounter (Signed)
Patient saw Dr Jaynee Eagles to address memory issues. She saw Amy last for migraine management.

## 2022-01-31 NOTE — Telephone Encounter (Signed)
Called pt back and relayed AL,NP note. She will continue on just Qulipta, aware not to take anymore Ajovy. She has not more on hand of the Ajovy.   She also reports that she woke up this morning with a headache and it worsened into a migraine. She tried taking Nurtec today around 12:30pm. Ineffective. Pain scale: 4/5 out of 10. Sensitive to noise but not light. Feels steroid pack has helped in the past. No hx of diabetes. Placed on hold and spoke with AL,NP. Ok to call in steroid pack. I relayed this to pt. She is willing to try this. I went over instructions. E-scribed to [employer].

## 2022-01-31 NOTE — Telephone Encounter (Signed)
error 

## 2022-02-14 ENCOUNTER — Telehealth: Payer: Self-pay | Admitting: Neurology

## 2022-02-14 NOTE — Telephone Encounter (Signed)
Pt states she is unable to take Atogepant (QULIPTA) 60 MG TABS , she states it makes her constipated.  Pt asking for a call to discuss.

## 2022-02-14 NOTE — Telephone Encounter (Signed)
Called pt moved her appointment to 2/21 @ 9:30 am. Pt said thank you so much for seeing me sooner.

## 2022-02-14 NOTE — Telephone Encounter (Signed)
Ok thank you and I'm glad we can see her sooner as well.

## 2022-02-14 NOTE — Telephone Encounter (Signed)
Can you call patient and move her April appt w/ Amy up to next available? I see something open at 9 AM on the 21st. The last time patient called in, Amy NP had said if her headaches were worse we could schedule a visit to be seen. Patient isn't able to tolerate the Qulipta so we should have the visit early to discuss other options.

## 2022-02-14 NOTE — Telephone Encounter (Signed)
Noted  

## 2022-02-15 DIAGNOSIS — I129 Hypertensive chronic kidney disease with stage 1 through stage 4 chronic kidney disease, or unspecified chronic kidney disease: Secondary | ICD-10-CM | POA: Diagnosis not present

## 2022-02-15 DIAGNOSIS — E60 Dietary zinc deficiency: Secondary | ICD-10-CM | POA: Diagnosis not present

## 2022-02-15 DIAGNOSIS — E538 Deficiency of other specified B group vitamins: Secondary | ICD-10-CM | POA: Diagnosis not present

## 2022-02-15 DIAGNOSIS — R208 Other disturbances of skin sensation: Secondary | ICD-10-CM | POA: Diagnosis not present

## 2022-02-15 DIAGNOSIS — K1379 Other lesions of oral mucosa: Secondary | ICD-10-CM | POA: Diagnosis not present

## 2022-02-19 DIAGNOSIS — R208 Other disturbances of skin sensation: Secondary | ICD-10-CM | POA: Diagnosis not present

## 2022-02-19 DIAGNOSIS — E60 Dietary zinc deficiency: Secondary | ICD-10-CM | POA: Diagnosis not present

## 2022-02-19 DIAGNOSIS — N390 Urinary tract infection, site not specified: Secondary | ICD-10-CM | POA: Diagnosis not present

## 2022-02-19 DIAGNOSIS — K1379 Other lesions of oral mucosa: Secondary | ICD-10-CM | POA: Diagnosis not present

## 2022-02-19 DIAGNOSIS — M545 Low back pain, unspecified: Secondary | ICD-10-CM | POA: Diagnosis not present

## 2022-02-20 ENCOUNTER — Ambulatory Visit (HOSPITAL_BASED_OUTPATIENT_CLINIC_OR_DEPARTMENT_OTHER)
Admission: RE | Admit: 2022-02-20 | Discharge: 2022-02-20 | Disposition: A | Discharge status: Home / Self Care | Payer: Medicare HMO | Source: Ambulatory Visit | Attending: Family Medicine | Admitting: Family Medicine

## 2022-02-20 ENCOUNTER — Other Ambulatory Visit (HOSPITAL_BASED_OUTPATIENT_CLINIC_OR_DEPARTMENT_OTHER): Payer: Self-pay | Admitting: Family Medicine

## 2022-02-20 DIAGNOSIS — R109 Unspecified abdominal pain: Secondary | ICD-10-CM | POA: Diagnosis not present

## 2022-02-20 DIAGNOSIS — B9620 Unspecified Escherichia coli [E. coli] as the cause of diseases classified elsewhere: Secondary | ICD-10-CM | POA: Diagnosis not present

## 2022-02-20 DIAGNOSIS — N390 Urinary tract infection, site not specified: Secondary | ICD-10-CM | POA: Diagnosis not present

## 2022-02-20 DIAGNOSIS — N2889 Other specified disorders of kidney and ureter: Secondary | ICD-10-CM | POA: Diagnosis not present

## 2022-02-20 NOTE — Progress Notes (Signed)
PATIENT: Savannah Taylor DOB: [birth year]  REASON FOR VISIT: follow up HISTORY FROM: patient  Chief Complaint  Patient presents with   Room 1    Pt is here Alone. Pt states that her migraines are about the same. Pt states that the last couple of days have been good.      HISTORY OF PRESENT ILLNESS:  02/21/22 ALL: Savannah Taylor returns for follow up for migraines. We switched her from Ajovy to [place] at last visit 10/2021. Nurtec used for abortive therapy. Savannah Taylor called early 2024 reporting worsening headaches. Steroid taper called in and was helpful. Savannah Taylor has had more constipation. Savannah Taylor continues to have regular headaches.. Savannah Taylor has at least 15 headache days per month. 8-10 are described as migrainous. BP has been well managed. Savannah Taylor is working on [employer] with increasing her walking. Savannah Taylor is now on trazodone for insomnia through PCP.   Savannah Taylor was seen in consult with Dr Lucia Gaskins 11/28/2021 for memory loss. MOCA 23/30. MRI and labs were unremarkable. Referral for formal neurocognitive testing placed. Savannah Taylor has an appt with Dr Kieth Brightly 06/27/2022.   Medications tried and failed: Qulipta, Amovig, Ajovy, Topamax, verapamil, amitriptyline, Depakote, Inderal, Effexor, Cymbalta, Nurtec, Ubrelvy, Imitrex injections, Maxalt, Neurontin and Botox.   10/26/2021 ALL: Savannah Taylor returns for follow up for migraines. We switched her from Amovig to Ajovy in 06/2021. Savannah Taylor has not noted much benefit. Savannah Taylor feels that the headaches may be less severe the week or two following her injection but then headaches return. Savannah Taylor reports having headaches most every day . Savannah Taylor has 10 severe migraine days. Nurtec helps with some headaches and not with others. HST did not show any significant sleep breathing disorder. BP has been normal.   06/29/2021 ALL: Savannah Taylor returns for follow up. Savannah Taylor was last seen 01/2020. Savannah Taylor was educated on the importance of consistency with CGRP injections and advised to consider sleep study. Since, Savannah Taylor reports  headaches were well managed. Savannah Taylor has continued Amovig consistently every month. Savannah Taylor has had a milder nagging headache for the past three weeks. Nurtec is not helping. Savannah Taylor has been monitoring her BP at home. Readings having been 140's-150's/80-90. Savannah Taylor was started on losartan 50mg  QD about 2 weeks ago.   Medications tried and failed: Amovig, Topamax, verapamil, amitriptyline, Depakote, Inderal, Effexor, Cymbalta, Nurtec, Ubrelvy, Imitrex injections, Maxalt, Neurontin and Botox.   Exp 11/2024 Lot [id number]  01/07/2020 ALL:  Savannah Taylor returns today for migraine follow up. Savannah Taylor was not taking Amovig consistently at last visit. Savannah Taylor was reeducated on dosing and advised to take 1 injection every month. Savannah Taylor took injections Oct, Nov and Dec. Savannah Taylor does feel that Amovig works to help reduce frequency and intensity of migraines for about 2 weeks. Week 3 and 4 Savannah Taylor has more headaches. We also started Nurtec for abortive therapy. Savannah Taylor reports that it works well to decrease intensity but does not completely relieve headache. Savannah Taylor called mid December reporting an intractable migraine. Depacon infusion provided some benefit but headache returned shortly after infusion. We had her take Nurtec daily for three days and started steroid taper. Savannah Taylor reports that Savannah Taylor continues to have headache. Pain is across the front of her head (more so on left). It is described as a dull swollen/tight feeling.   We have discussed concerns of sleep apnea. Savannah Taylor does not sleep well. Savannah Taylor wakes up multiple times a night. Savannah Taylor often wakes with headaches. Savannah Taylor does snore. Savannah Taylor never feels rested when Savannah Taylor wakes. Savannah Taylor has been hesitant to consider workup due to  financial concerns.   We received a referral from PCP for chronic muscle cramps. Cramps are generalized and effect upper and lower extremities. PCP performed lab eval that did not reveal any significant etiology for symptoms. Savannah Taylor has had tingling/pins needles sensation for over a year. Sensation comes and goes.  Savannah Taylor feels both legs are numb from tips of her toes to the knee on both sides but worse on the left. Savannah Taylor tried gabapentin but was unable to tolerate due to brain fog. Savannah Taylor takes 600mg  1-2 times weekly as needed. Savannah Taylor reports that Savannah Taylor still is not recovered from lumbar fusion surgery in 01/2019. Robaxin prescribed by neurosurgery. Savannah Taylor was referred to Dr Murray Hodgkins for pain management. Savannah Taylor reports having a normal NCS/EMG many years ago. Savannah Taylor reports that Savannah Taylor cant hold her neck in certain positions or her neck with lock up. Savannah Taylor feels that her hands lock up if Savannah Taylor tries to hold anything for any period of time. Savannah Taylor has spasms of her legs. No incontinence.   Savannah Taylor is taking oxycodone as needed for back pain. Savannah Taylor denies daily use. Savannah Taylor has filled 30 tablets on 11/17/2019 and 12/14/2019. Savannah Taylor also has alprazolam and clonazepam on her medication list. Savannah Taylor is not certain how often Savannah Taylor takes either of these. Per PDMP, Savannah Taylor alternates filling alprazolam and clonazepam. Savannah Taylor reports that Savannah Taylor has ongoing depression and anxiety. Savannah Taylor was taking Wellbutrin at last visit but reports that Savannah Taylor has stopped this medicaiton sometime since last being seen. Savannah Taylor is not sure when but didn't like how Savannah Taylor felt on it.    History (copied from previous notes)  Savannah Taylor is a 67 y.o. female here today for follow up migraines. Savannah Taylor continues to have regular headaches. Savannah Taylor has not reached out to [place] to report headaches have continues. Savannah Taylor has some sort of headache every day. Savannah Taylor has at least 16-20 migraines every month. Savannah Taylor reports taking Amovig every 3 months. Reports that Savannah Taylor has only gotten 1 injection every three months. Amovig helps significantly after injection. Savannah Taylor does not use abortive medication. Savannah Taylor did not feel Ubrelvy helped. Savannah Taylor did not continue venlafaxine. Savannah Taylor recently lost her mother. Savannah Taylor continues to grieve her loss. Savannah Taylor cries frequently. PCP has been treating depression with Wellbutrin but Savannah Taylor hs not taken consistently. Savannah Taylor is not  sleeping well. Savannah Taylor is always tired. Savannah Taylor wakes frequently at night with headaches or to use the bathroom. Savannah Taylor does snore. We have discussed concerns of sleep apnea in the past but Savannah Taylor has been reluctant to consider workup. Her son has sleep apnea.    HISTORY: (copied from my note on 07/30/2018)  Savannah Taylor is a 67 y.o. female here today for follow up for migraines and concerns of sleep apnea. Savannah Taylor reports that headaches are unchanged. Savannah Taylor continues to have frequent migraines but feels they are not as intense. Savannah Taylor is caring for her mother with end stage dementia and cancer. Savannah Taylor is taking venlafaxine 75mg  sporadically when Savannah Taylor feels Savannah Taylor needs it. Savannah Taylor has taken Ubrelvy once but is insure if it helped.    Savannah Taylor has not followed through with sleep apnea evaluation. Savannah Taylor was uncertain if insurance would cover it. Savannah Taylor is interested in continuing with evaluation.    HISTORY: (copied from my note on 03/27/2018)   Savannah Taylor is a 67 y.o. female for follow up. Savannah Taylor is taking topiramate 100mg  daily and Amovig 140mg  monthly for management of migraines.  Savannah Taylor reports that her headaches continue to occur daily.  Savannah Taylor  has mild pain with most headaches but does report at least 10 migraines per month.  Migraines are typically unilateral and associated with nausea, light and sound sensitivity.  Last month Savannah Taylor took an extra dose of Aimovig and reports that that had helped some.  Savannah Taylor states that the first 2 to 3 weeks after taking Aimovig Savannah Taylor feels that Savannah Taylor is well managed, however, the next week or so her migraines are pretty bad.  Savannah Taylor is taking Zomig for abortive therapy but reports it is no longer helping.  Savannah Taylor has been on multiple preventative medications including Topamax, verapamil, amitriptyline, Depakote, Inderal, Imitrex injections, Maxalt, Neurontin and Botox.  Savannah Taylor is also tried Effexor and Cymbalta.  Headaches have not changed in nature.  Last imaging reviewed was a CT of her head in 2014 that was unremarkable.    Savannah Taylor reports about 10 years ago Savannah Taylor had a work-up for sleep apnea.  Savannah Taylor is unsure of the results of her test but does mention that Savannah Taylor was told if Savannah Taylor ever reaches a deep stage of sleep.  Savannah Taylor does endorse snoring, frequent waking, nocturia, dry mouth, occasional morning headaches, daytime sleepiness and fatigue.  Savannah Taylor is interested in an updated sleep study.     History (copied from [employer] note dated 06/18/17)    Savannah Taylor is a 67 year old female with a history of migraine.  Savannah Taylor states that Savannah Taylor had a severe headache for the last 2 to 3 weeks.  Savannah Taylor is on last day of her prednisone Dosepak.  Reports prednisone has been beneficial.  Reports that Savannah Taylor still has a mild headache but not as severe as it was before.  Savannah Taylor states that her headaches typically occur on the left side.  Savannah Taylor always has phonophobia with her headaches.  Savannah Taylor remains on Topamax.  Savannah Taylor also uses Zomig as needed.  Savannah Taylor reports that Aimovig worked well for her migraines in the first 2 months.  Savannah Taylor states by the third month Savannah Taylor began having a constant dull headache.  Savannah Taylor is currently taking the 70 mg injection.  Savannah Taylor returns today for evaluation.   HISTORY:Savannah Taylor is a 67 y.o. female here as a referral from Dr. Hyman Hopes for migraines. Past medical history of chronic migraines, depression, hypertension, high cholesterol, B12 deficiency, chronic insomnia, chronic kidney disease, multilevel degenerative disease. Savannah Taylor has been seeing Dr. Neale Burly for nerve injections. Migraines since a teenager without any inciting event or head trauma. Savannah Taylor has a family history of migraines, Her son and paternal aunt have migraines. Savannah Taylor has been having injections of the headache [facility], Savannah Taylor went every month and then every 2 months (likely nerve block) but Savannah Taylor also says that Savannah Taylor received Botox which helped but insurance would not pay anymore. Now Savannah Taylor just goes as needed. For nerve blocks. Savannah Taylor has headaches 15 days a month. 8-10 are migrainous. This  is been ongoing for the last 6 months. Her migraines feele like her head is swelling on one side, but they can move around, one eye droops, feels tight, Savannah Taylor can hear her heart beating in her head especially when laying down, more pounding than pressure, Savannah Taylor can't stand sound or noise. Dark rooms help. Being quiet helps. They can last for days straight. They can be severe with nausea and vomiting on average out of 10 can be 6-7/10. Savannah Taylor has been to "so many doctors and so many places" and the doctors get frustrated because they can't help. Savannah Taylor has been to headaches clinics. Savannah Taylor was  told Savannah Taylor was faking in the past. Savannah Taylor has tried everything per patient. No other associated symptoms or focal neurologic deficits or complaints. No aura. No medication overuse.    Tried: Topamax, verapamil, amitriptyline, depakote, inderal, Savannah Taylor has had imitrex injections, maxalt, zomig. Neurontin, botox helped a lot but then her physician said insurance wouldn;t pay, effexor, cymbalta.    Reviewed notes, labs and imaging from outside physicians, which showed:   BUN 12 and creatinine 1.15 taken 04/05/2015. TSH 0.810 January 2017.   CT of the head 04/2012 showed No acute intracranial abnormalities including mass lesion or mass effect, hydrocephalus, extra-axial fluid collection, midline shift, hemorrhage, or acute infarction, large ischemic events (personally reviewed images)    REVIEW OF SYSTEMS: Out of a complete 14 system review of symptoms, the patient complains only of the following symptoms, headaches, depression, insomnia, snoring, muscle cramps, chronic pain and all other reviewed systems are negative.   ALLERGIES: Allergies  Allergen Reactions   Bystolic [Nebivolol Hcl] Palpitations and Other (See Comments)   Atorvastatin Other (See Comments)    Cramping in hands and feet   Morphine And Related Itching   Naproxen Hives and Itching   Sertraline Hcl Other (See Comments)    hallucinations   Topiramate Other (See  Comments)    Apathetic "out of it"   Venlafaxine Hcl Er Other (See Comments)    Hair loss   Rosuvastatin Other (See Comments)    Cramping in hands and feet and leg muscles    HOME MEDICATIONS: Outpatient Medications Prior to Visit  Medication Sig Dispense Refill   amLODipine (NORVASC) 5 MG tablet Take 5 mg by mouth in the morning and at bedtime.     aspirin EC 81 MG tablet Take 1 tablet (81 mg total) by mouth daily. Swallow whole. 90 tablet 3   Evolocumab (REPATHA SURECLICK) 140 MG/ML SOAJ Inject 140 mg into the skin every 14 (fourteen) days. 2 mL 11   Rimegepant Sulfate (NURTEC) 75 MG TBDP Take 75 mg by mouth daily as needed (take for abortive therapy of migraine, no more than 1 tablet in 24 hours or 10 per month). 8 tablet 11   traZODone (DESYREL) 50 MG tablet Take 50 mg by mouth at bedtime as needed.     Atogepant (QULIPTA) 60 MG TABS Take 60 mg by mouth daily. 90 tablet 3   methylPREDNISolone (MEDROL DOSEPAK) 4 MG TBPK tablet Take as directed (Patient not taking: Reported on 02/21/2022) 21 tablet 0   No facility-administered medications prior to visit.    PAST MEDICAL HISTORY: Past Medical History:  Diagnosis Date   Abdominal cramps    Asthma    Back problem    BAD DISK IN BACK   DDD (degenerative disc disease), lumbosacral    Depression    Gastritis    GERD (gastroesophageal reflux disease)    H/O hiatal hernia    Heart murmur    Years ago   Hyperlipidemia    Hypertension    Insomnia    Knee problem    Leg cramps    Migraine    01/22/2019- last one > 1 month ago   Post concussion syndrome    Seizure (HCC)    1 many years ago with a migraine   Sleep apnea    does not use CPAP   Stroke (HCC)    TIA  slight memory  issues TIA prior to 2010   Ulcer    STOMACH & SORENESS & PAIN   UTI (urinary  tract infection) 07/02/2012    PAST SURGICAL HISTORY: Past Surgical History:  Procedure Laterality Date   ABDOMINAL HYSTERECTOMY     ARTHROGRAM KNEE Right    MCL tear    BACK SURGERY     PLIF L4-5, 5-S1 Dr. Yetta Barre 01-2019   BREAST REDUCTION SURGERY Bilateral 05/10/2021   Procedure: MAMMARY REDUCTION  (BREAST);  Surgeon: Peggye Form, DO;  Location: [facility];  Service: Plastics;  Laterality: Bilateral;   COLONOSCOPY     ENDOSCOPY  2020   hiatal hernia, gastritis, and some bacteria in stomach   HEMORRHOID SURGERY      FAMILY HISTORY: Family History  Problem Relation Age of Onset   Hypertension Mother    Hyperlipidemia Mother    Asthma Father    Cancer Father    Hypertension Brother    Sleep apnea Son    Breast cancer Neg Hx     SOCIAL HISTORY: Social History   Socioeconomic History   Marital status: Married    Spouse name: Eddie   Number of children: 3   Years of education: Some college   Highest education level: Not on file  Occupational History   Occupation: works full time  Tobacco Use   Smoking status: Never   Smokeless tobacco: Never  Vaping Use   Vaping Use: Never used  Substance and Sexual Activity   Alcohol use: No   Drug use: No   Sexual activity: Not Currently    Birth control/protection: Surgical  Other Topics Concern   Not on file  Social History Narrative   Lives with husband   Caffeine use: Coffee/tea/soda sometimes per patient   Social Determinants of Health   Financial Resource Strain: Not on file  Food Insecurity: No Food Insecurity (09/13/2021)   Hunger Vital Sign    Worried About Running Out of Food in the Last Year: Never true    Ran Out of Food in the Last Year: Never true  Transportation Needs: No Transportation Needs (09/13/2021)   PRAPARE - Administrator, Civil Service (Medical): No    Lack of Transportation (Non-Medical): No  Physical Activity: Not on file  Stress: Not on file  Social Connections: Not on file  Intimate Partner Violence: Not on file      PHYSICAL EXAM  Vitals:   02/21/22 0940  BP: 115/76  Pulse: 78  Weight: 191 lb (86.6 kg)  Height: 5\' 5"   (1.651 m)      Body mass index is 31.78 kg/m.  Generalized: Well developed, in no acute distress  Cardiology: normal rate and rhythm, no murmur noted Respiratory: clear to auscultation bilaterally  Neurological examination  Mentation: Alert oriented to time, place, history taking. Follows all commands speech and language fluent Cranial nerve II-XII: Pupils were equal round reactive to light. Extraocular movements were full, visual field were full  Motor: The motor testing reveals 5 over 5 strength of all 4 extremities. Good symmetric motor tone is noted throughout.  Gait and station: Gait is normal.      DIAGNOSTIC DATA (LABS, IMAGING, TESTING) - I reviewed patient records, labs, notes, testing and imaging myself where available.      No data to display           Lab Results  Component Value Date   WBC 6.9 07/07/2020   HGB 13.5 07/07/2020   HCT 40.6 07/07/2020   MCV 89.8 07/07/2020   PLT 333 07/07/2020      Component Value Date/Time  NA 140 11/28/2021 1140   K 4.5 11/28/2021 1140   CL 104 11/28/2021 1140   CO2 23 11/28/2021 1140   GLUCOSE 88 11/28/2021 1140   GLUCOSE 100 (H) 07/07/2020 1750   BUN 12 11/28/2021 1140   CREATININE 1.06 (H) 11/28/2021 1140   CALCIUM 9.6 11/28/2021 1140   PROT 7.1 08/31/2015 1313   ALBUMIN 4.4 08/31/2015 1313   AST 14 08/31/2015 1313   ALT 9 08/31/2015 1313   ALKPHOS 74 08/31/2015 1313   BILITOT 0.3 08/31/2015 1313   GFRNONAA >60 07/07/2020 1750   GFRAA >60 01/22/2019 1030   No results found for: "CHOL", "HDL", "LDLCALC", "LDLDIRECT", "TRIG", "CHOLHDL" No results found for: "HGBA1C" Lab Results  Component Value Date   VITAMINB12 >2000 (H) 11/28/2021   No results found for: "TSH"   ASSESSMENT AND PLAN 67 y.o. year old female  has a past medical history of Abdominal cramps, Asthma, Back problem, DDD (degenerative disc disease), lumbosacral, Depression, Gastritis, GERD (gastroesophageal reflux disease), H/O hiatal hernia,  Heart murmur, Hyperlipidemia, Hypertension, Insomnia, Knee problem, Leg cramps, Migraine, Post concussion syndrome, Seizure (HCC), Sleep apnea, Stroke (HCC), Ulcer, and UTI (urinary tract infection) (07/02/2012). here with     ICD-10-CM   1. Chronic migraine without aura, intractable, without status migrainosus  G43.719     2. Memory loss  R41.3       Savannah Taylor reports headaches continue on Qulipta and Savannah Taylor is experiencing constipation. We will discontinue Qulipta and start Vyepti infusions every 12 weeks. Savannah Taylor will continue Nurtec for migraine abortion. Savannah Taylor may use Tylenol and Benadryl. Avoid Nsaids d/t stomach ulcer. Triptan not effective. May consider nasal CGRP in future if needed. BP has been stable. HST normal. Savannah Taylor will see Dr Kieth Brightly for formal neurocognitive testing as scheduled. Savannah Taylor will focus on healthy lifestyle habits as discussed.  We will plan to follow-up in 4-6 months. Savannah Taylor verbalizes understanding and agreement with this plan.   No orders of the defined types were placed in this encounter.    Meds ordered this encounter  Medications   Eptinezumab-jjmr (VYEPTI) 100 MG/ML injection    Sig: Inject 1 mL (100 mg total) into the vein every 3 (three) months.    Dispense:  1 mL    Refill:  3    Order Specific Question:   Supervising Provider    Answer:   Anson Fret [[id number]]      AVW [id number], FNP-C 02/21/2022, 10:00 AM [facility] [address], [address] [place], [place] [address] [phone]

## 2022-02-20 NOTE — Patient Instructions (Incomplete)
Below is our plan:  We will try to get Vyepti infusions covered for you. We will discontinue Qulipta. We will continue Nurtec for now.   Please make sure you are staying well hydrated. I recommend 50-60 ounces daily. Well balanced diet and regular exercise encouraged. Consistent sleep schedule with 6-8 hours recommended.   Please continue follow up with care team as directed.   Follow up with me in 4-6 months   You may receive a survey regarding today's visit. I encourage you to leave honest feed back as I do use this information to improve patient care. Thank you for seeing me today!   Management of Memory Problems   There are some general things you can do to help manage your memory problems.  Your memory may not in fact recover, but by using techniques and strategies you will be able to manage your memory difficulties better.   1)  Establish a routine. Try to establish and then stick to a regular routine.  By doing this, you will get used to what to expect and you will reduce the need to rely on your memory.  Also, try to do things at the same time of day, such as taking your medication or checking your calendar first thing in the morning. Think about think that you can do as a part of a regular routine and make a list.  Then enter them into a daily planner to remind you.  This will help you establish a routine.   2)  Organize your environment. Organize your environment so that it is uncluttered.  Decrease visual stimulation.  Place everyday items such as keys or cell phone in the same place every day (ie.  Basket next to front door) Use post it notes with a brief message to yourself (ie. Turn off light, lock the door) Use labels to indicate where things go (ie. Which cupboards are for food, dishes, etc.) Keep a notepad and pen by the telephone to take messages   3)  Memory Aids A diary or journal/notebook/daily planner Making a list (shopping list, chore list, to do list that needs to  be done) Using an alarm as a reminder (kitchen timer or cell phone alarm) Using cell phone to store information (Notes, Calendar, Reminders) Calendar/White board placed in a prominent position Post-it notes   In order for memory aids to be useful, you need to have good habits.  It's no good remembering to make a note in your journal if you don't remember to look in it.  Try setting aside a certain time of day to look in journal.   4)  Improving mood and managing fatigue. There may be other factors that contribute to memory difficulties.  Factors, such as anxiety, depression and tiredness can affect memory. Regular gentle exercise can help improve your mood and give you more energy. Exercise: there are short videos created by the [employer] on Health specially for older adults: https://bit.ly/2I30q97.  Mediterranean diet: which emphasizes fruits, vegetables, whole grains, legumes, fish, and other seafood; unsaturated fats such as olive oils; and low amounts of red meat, eggs, and sweets. A variation of this, called MIND (Mediterranean-DASH Intervention for Neurodegenerative Delay) incorporates the DASH (Dietary Approaches to Stop Hypertension) diet, which has been shown to lower high blood pressure, a risk factor for Alzheimer's disease. More information at: ExitMarketing.de.  Aerobic exercise that improve heart health is also good for the mind.  [employer] on Aging have short videos for exercises that  you can do at home: BlindWorkshop.com.pt Simple relaxation techniques may help relieve symptoms of anxiety Try to get back to completing activities or hobbies you enjoyed doing in the past. Learn to pace yourself through activities to decrease fatigue. Find out about some local support groups where you can share experiences with others. Try and achieve 7-8 hours of sleep at night.

## 2022-02-21 ENCOUNTER — Ambulatory Visit: Payer: Medicare HMO | Admitting: Family Medicine

## 2022-02-21 ENCOUNTER — Encounter: Payer: Self-pay | Admitting: Family Medicine

## 2022-02-21 ENCOUNTER — Telehealth: Payer: Self-pay | Admitting: *Deleted

## 2022-02-21 VITALS — BP 115/76 | HR 78 | Ht 65.0 in | Wt 191.0 lb

## 2022-02-21 DIAGNOSIS — G43719 Chronic migraine without aura, intractable, without status migrainosus: Secondary | ICD-10-CM

## 2022-02-21 DIAGNOSIS — R413 Other amnesia: Secondary | ICD-10-CM | POA: Diagnosis not present

## 2022-02-21 MED ORDER — VYEPTI 100 MG/ML IV SOLN: 100.0000 mg | INTRAVENOUS | 3 refills | Status: DC

## 2022-02-21 NOTE — Telephone Encounter (Signed)
Vyepti infusion order for Vyepti 100 mg IV 100 mls NS over 30 minutes every 3 months x 1 year. Orders given to Destin at [facility].

## 2022-02-22 DIAGNOSIS — D2361 Other benign neoplasm of skin of right upper limb, including shoulder: Secondary | ICD-10-CM | POA: Diagnosis not present

## 2022-02-22 DIAGNOSIS — Z789 Other specified health status: Secondary | ICD-10-CM | POA: Diagnosis not present

## 2022-02-22 DIAGNOSIS — R208 Other disturbances of skin sensation: Secondary | ICD-10-CM | POA: Diagnosis not present

## 2022-02-22 DIAGNOSIS — L538 Other specified erythematous conditions: Secondary | ICD-10-CM | POA: Diagnosis not present

## 2022-03-01 IMAGING — MG MM BREAST LOCALIZATION CLIP
4 series · 4 of 12 positions shown · non-contrast
Comparison: Previous exam(s).

CLINICAL DATA: Status post stereotactic guided core biopsy of RIGHT
breast calcifications.

EXAM:
3D DIAGNOSTIC RIGHT MAMMOGRAM POST STEREOTACTIC BIOPSY

[R ML synth-2D]
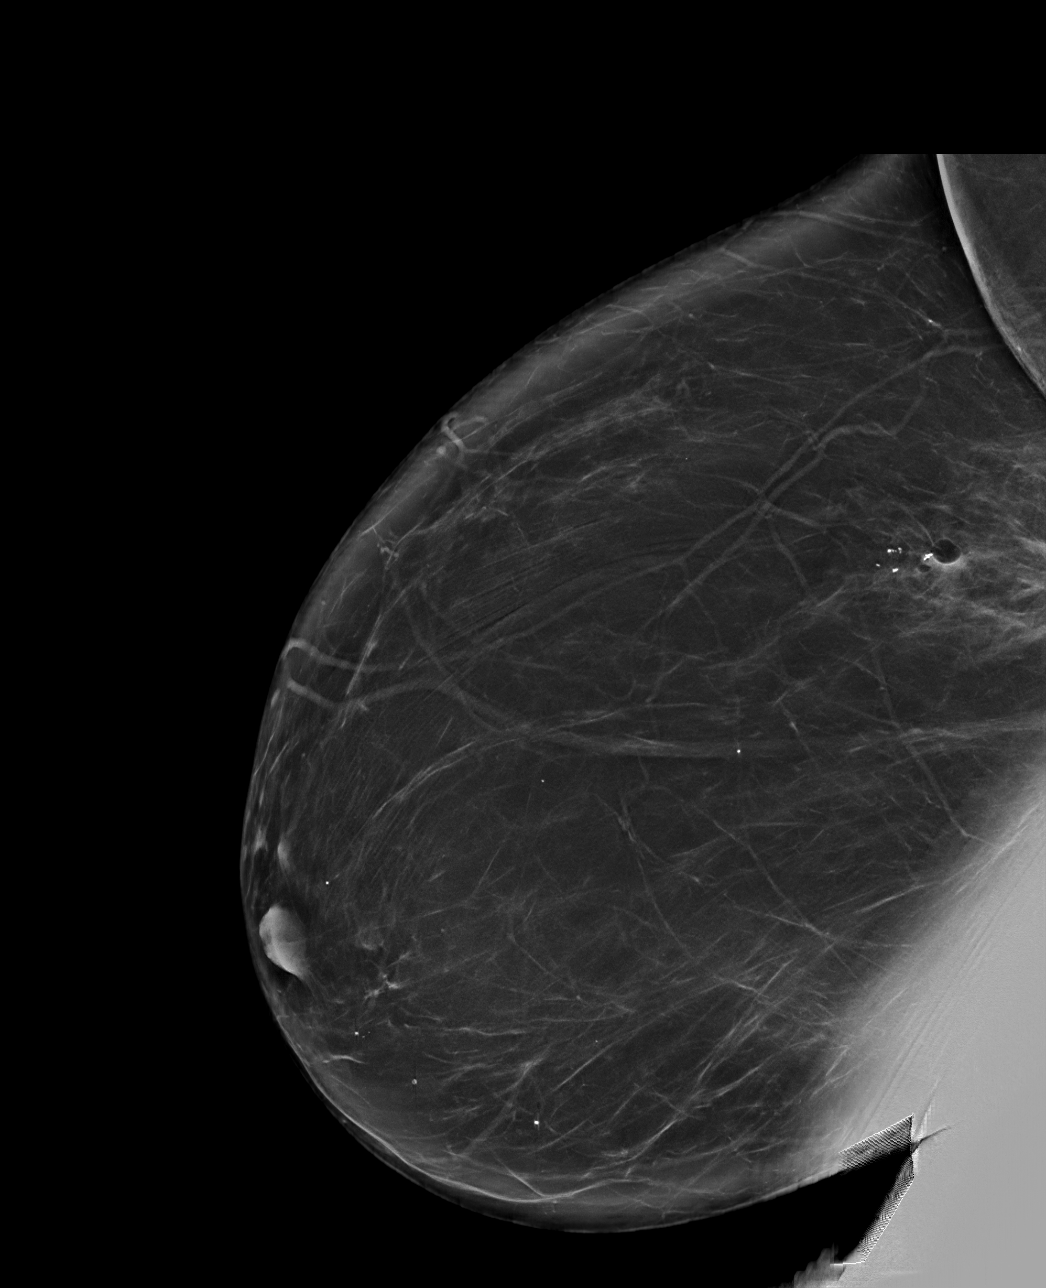

[R CC synth-2D]
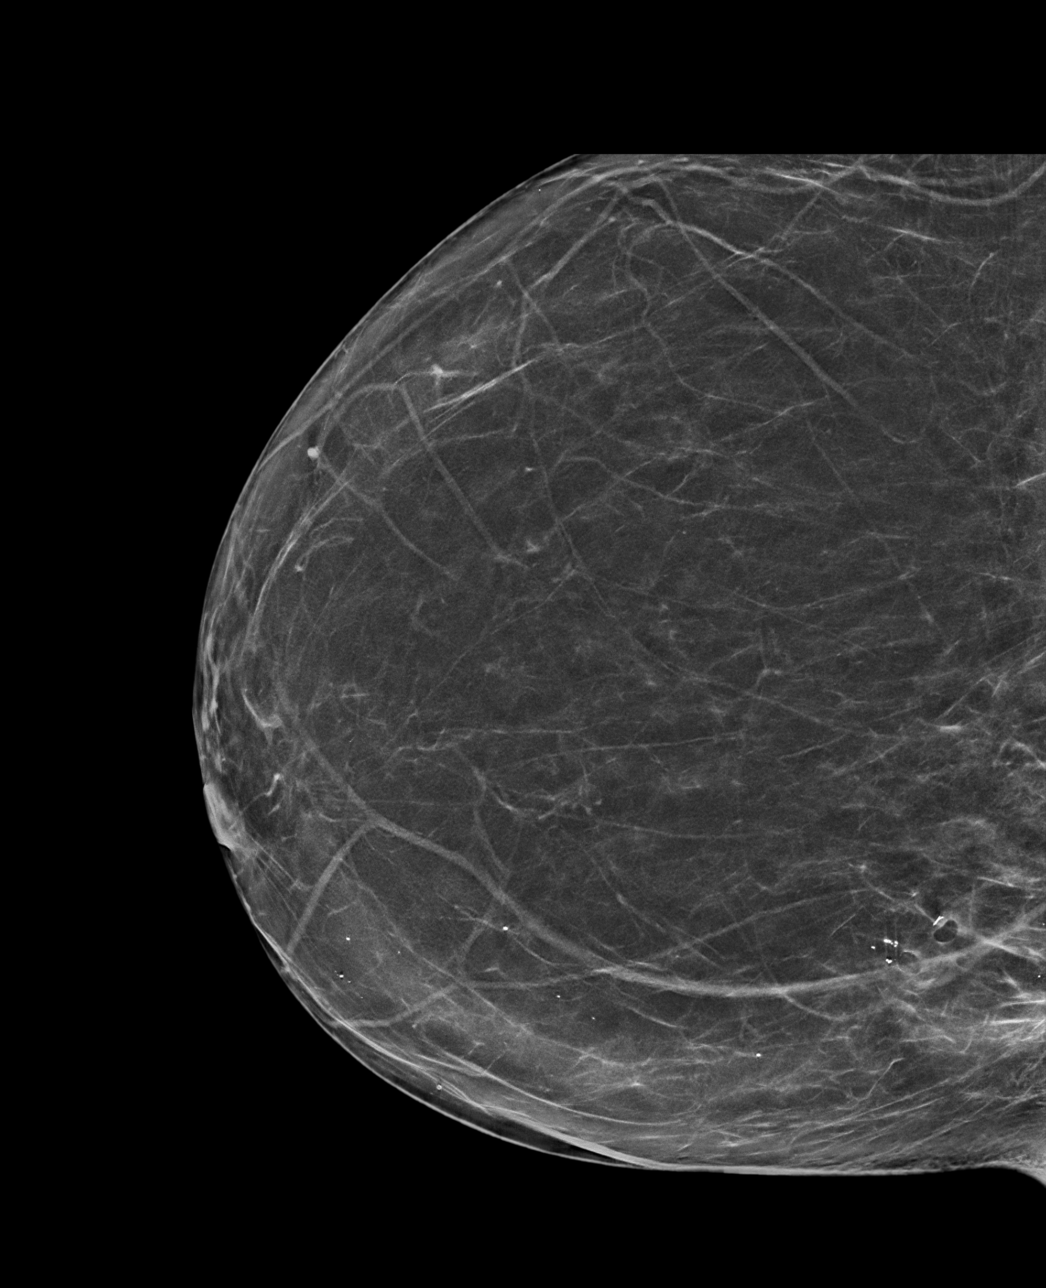

[R ML tomo · tomo slice 47/92.0]
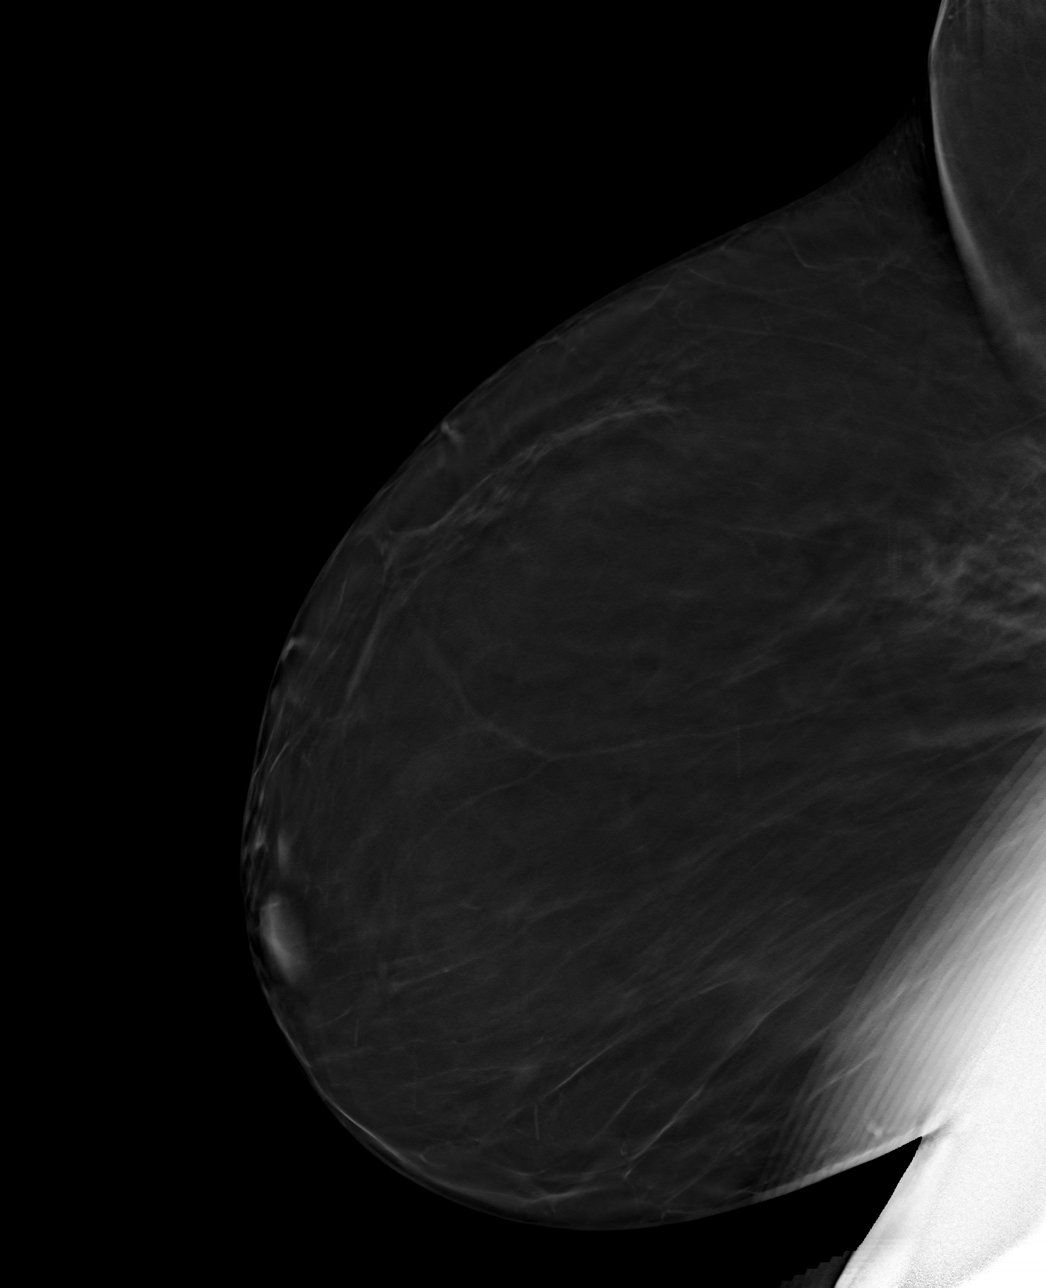

[R CC tomo · tomo slice 38/75.0]
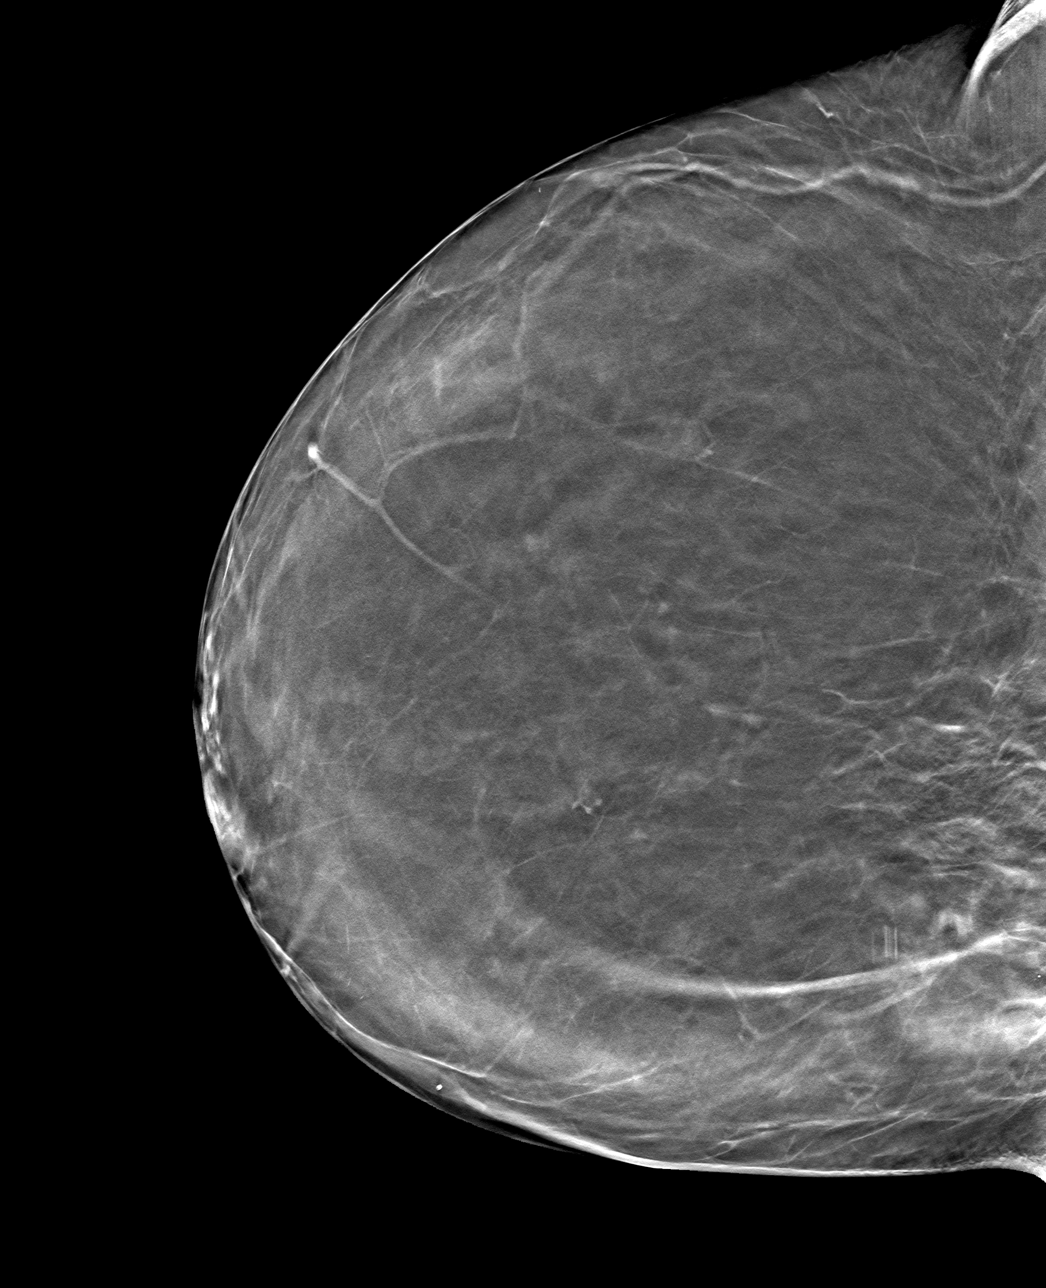

[4 of 12 positions shown; findings below may reference images not displayed]

FINDINGS: 3D Mammographic images were obtained following stereotactic guided
biopsy of calcifications in the UPPER INNER QUADRANT the RIGHT
breast and placement of an X shaped. The biopsy marking clip is in
expected position at the site of biopsy. Clip is adjacent to
residual calcifications.
IMPRESSION: Appropriate positioning of the X shaped biopsy marking clip at the
site of biopsy in the UPPER INNER QUADRANT RIGHT breast.

Final Assessment: Post Procedure Mammograms for Marker Placement

## 2022-03-12 ENCOUNTER — Other Ambulatory Visit: Payer: Self-pay | Admitting: Orthopedic Surgery

## 2022-03-12 ENCOUNTER — Telehealth: Payer: Self-pay | Admitting: Cardiovascular Disease

## 2022-03-12 ENCOUNTER — Telehealth: Payer: Self-pay

## 2022-03-12 DIAGNOSIS — M79644 Pain in right finger(s): Secondary | ICD-10-CM | POA: Diagnosis not present

## 2022-03-12 DIAGNOSIS — M6740 Ganglion, unspecified site: Secondary | ICD-10-CM | POA: Diagnosis not present

## 2022-03-12 DIAGNOSIS — M19041 Primary osteoarthritis, right hand: Secondary | ICD-10-CM | POA: Diagnosis not present

## 2022-03-12 NOTE — Telephone Encounter (Signed)
Patient agreeable with telehealth appointment. Med list reviewed and consent given.

## 2022-03-12 NOTE — Telephone Encounter (Signed)
   Pre-operative Risk Assessment    Patient Name: Savannah Taylor  DOB: March 01, 1955 MRN: [id number]      Request for Surgical Clearance    Procedure:   Right small finger excision mucoid cyst and debridement DIP joint, possible rotation flap  Date of Surgery:  Clearance 03/26/22                                 Surgeon:  Dr. [name] Surgeon's Group or Practice Name:  [facility] Phone number:  [phone] Fax number:  [phone]   Type of Clearance Requested:   - Medical  - Pharmacy:  Hold Aspirin     Type of Anesthesia:   Choice   Additional requests/questions:   Caller notes if there is a cancellation they may get this procedure done sooner .    Signed, Heloise Beecham   03/12/2022, 11:07 AM

## 2022-03-12 NOTE — Telephone Encounter (Signed)
   Name: Savannah Taylor  DOB: [birth year]  MRN: [id number]  Primary Cardiologist: None   Preoperative team, please contact this patient and set up a phone call appointment for further preoperative risk assessment. Please obtain consent and complete medication review. Thank you for your help.  I confirm that guidance regarding antiplatelet and oral anticoagulation therapy has been completed and, if necessary, noted below.  Regarding ASA therapy, we recommend continuation of ASA throughout the perioperative period.  However, if the surgeon feels that cessation of ASA is required in the perioperative period, it may be stopped 5-7 days prior to surgery with a plan to resume it as soon as felt to be feasible from a surgical standpoint in the post-operative period.    Mable Fill, Marissa Nestle, NP 03/12/2022, 11:41 AM [place]

## 2022-03-12 NOTE — Telephone Encounter (Signed)
  Patient Consent for Virtual Visit         Savannah Taylor has provided verbal consent on 03/12/2022 for a virtual visit (video or telephone).   CONSENT FOR VIRTUAL VISIT FOR:  Savannah Taylor  By participating in this virtual visit I agree to the following:  I hereby voluntarily request, consent and authorize [facility] and its employed or contracted physicians, physician assistants, nurse practitioners or other licensed health care professionals (the Practitioner), to provide me with telemedicine health care services (the "Services") as deemed necessary by the treating Practitioner. I acknowledge and consent to receive the Services by the Practitioner via telemedicine. I understand that the telemedicine visit will involve communicating with the Practitioner through live audiovisual communication technology and the disclosure of certain medical information by electronic transmission. I acknowledge that I have been given the opportunity to request an in-person assessment or other available alternative prior to the telemedicine visit and am voluntarily participating in the telemedicine visit.  I understand that I have the right to withhold or withdraw my consent to the use of telemedicine in the course of my care at any time, without affecting my right to future care or treatment, and that the Practitioner or I may terminate the telemedicine visit at any time. I understand that I have the right to inspect all information obtained and/or recorded in the course of the telemedicine visit and may receive copies of available information for a reasonable fee.  I understand that some of the potential risks of receiving the Services via telemedicine include:  Delay or interruption in medical evaluation due to technological equipment failure or disruption; Information transmitted may not be sufficient (e.g. poor resolution of images) to allow for appropriate medical decision making by the  Practitioner; and/or  In rare instances, security protocols could fail, causing a breach of personal health information.  Furthermore, I acknowledge that it is my responsibility to provide information about my medical history, conditions and care that is complete and accurate to the best of my ability. I acknowledge that Practitioner's advice, recommendations, and/or decision may be based on factors not within their control, such as incomplete or inaccurate data provided by me or distortions of diagnostic images or specimens that may result from electronic transmissions. I understand that the practice of medicine is not an exact science and that Practitioner makes no warranties or guarantees regarding treatment outcomes. I acknowledge that a copy of this consent can be made available to me via my patient portal ([facility]), or I can request a printed copy by calling the office of [place].    I understand that my insurance will be billed for this visit.   I have read or had this consent read to me. I understand the contents of this consent, which adequately explains the benefits and risks of the Services being provided via telemedicine.  I have been provided ample opportunity to ask questions regarding this consent and the Services and have had my questions answered to my satisfaction. I give my informed consent for the services to be provided through the use of telemedicine in my medical care

## 2022-03-13 ENCOUNTER — Ambulatory Visit: Payer: Medicare HMO | Attending: Cardiovascular Disease

## 2022-03-13 DIAGNOSIS — I2510 Atherosclerotic heart disease of native coronary artery without angina pectoris: Secondary | ICD-10-CM | POA: Diagnosis not present

## 2022-03-13 LAB — HEPATIC FUNCTION PANEL
ALT: 10 IU/L (ref 0–32)
AST: 16 IU/L (ref 0–40)
Albumin: 4.2 g/dL (ref 3.9–4.9)
Alkaline Phosphatase: 114 IU/L (ref 44–121)
Bilirubin Total: 0.3 mg/dL (ref 0.0–1.2)
Bilirubin, Direct: <0.1 mg/dL (ref 0.00–0.40)
Total Protein: 6.5 g/dL (ref 6.0–8.5)

## 2022-03-13 LAB — LIPID PANEL
Chol/HDL Ratio: 3.2 ratio (ref 0.0–4.4)
Cholesterol, Total: 209 mg/dL — ABNORMAL HIGH (ref 100–199)
HDL: 65 mg/dL (ref >39)
LDL Chol Calc (NIH): 129 mg/dL — ABNORMAL HIGH (ref 0–99)
Triglycerides: 87 mg/dL (ref 0–149)
VLDL Cholesterol Cal: 15 mg/dL (ref 5–40)

## 2022-03-13 NOTE — Progress Notes (Unsigned)
Virtual Visit via Telephone Note   Because of Samrawit Vandenheuvel Alessandrini's co-morbid illnesses, she is at least at moderate risk for complications without adequate follow up.  This format is felt to be most appropriate for this patient at this time.  The patient did not have access to video technology/had technical difficulties with video requiring transitioning to audio format only (telephone).  All issues noted in this document were discussed and addressed.  No physical exam could be performed with this format.  Please refer to the patient's chart for her consent to telehealth for [facility]  Evaluation Performed:  Preoperative cardiovascular risk assessment _____________   Date:  03/13/2022   Patient ID:  Savannah Taylor, DOB [birth year], MRN [id number] Patient Location:  Home Provider location:   Office  Primary Care Provider:  Saintclair Halsted, FNP Primary Cardiologist:  None  Chief Complaint / Patient Profile   67 y.o. y/o female with a h/o nonobstructive CAD, HTN, HLD, TIA who is pending right small finger excision of cyst with debridement and possible rotation flap and presents today for telephonic preoperative cardiovascular risk assessment.  History of Present Illness    Savannah Taylor is a 67 y.o. female who presents via audio/video conferencing for a telehealth visit today.  Pt was last seen in cardiology clinic on 11/27/2021 by [name].  At that time Savannah Taylor was doing well with no complaints of chest pain but did endorse a few episodes of dizziness when she moves too quickly..  The patient is now pending procedure as outlined above. Since her last visit, she reports that she is doing well with no new cardiac complaints.  She reports 1 episode of dizziness but states that it has resolved for the most part.  She  She denies chest pain, shortness of breath, lower extremity edema, fatigue, palpitations, melena, hematuria, hemoptysis, diaphoresis, weakness,  presyncope, syncope, orthopnea, and PND.     Past Medical History    Past Medical History:  Diagnosis Date   Abdominal cramps    Asthma    Back problem    BAD DISK IN BACK   DDD (degenerative disc disease), lumbosacral    Depression    Gastritis    GERD (gastroesophageal reflux disease)    H/O hiatal hernia    Heart murmur    Years ago   Hyperlipidemia    Hypertension    Insomnia    Knee problem    Leg cramps    Migraine    01/22/2019- last one > 1 month ago   Post concussion syndrome    Seizure ([place])    1 many years ago with a migraine   Sleep apnea    does not use CPAP   Stroke (Savannah Taylor)    TIA  slight memory  issues TIA prior to 2010   Ulcer    STOMACH & SORENESS & PAIN   UTI (urinary tract infection) 07/02/2012   Past Surgical History:  Procedure Laterality Date   ABDOMINAL HYSTERECTOMY     ARTHROGRAM KNEE Right    MCL tear   BACK SURGERY     PLIF L4-5, 5-S1 Dr. Ronnald Ramp 01-2019   BREAST REDUCTION SURGERY Bilateral 05/10/2021   Procedure: MAMMARY REDUCTION  (BREAST);  Surgeon: Wallace Going, DO;  Location: [place];  Service: Plastics;  Laterality: Bilateral;   COLONOSCOPY     ENDOSCOPY  2020   hiatal hernia, gastritis, and some bacteria in stomach   HEMORRHOID SURGERY  Allergies  Allergies  Allergen Reactions   Bystolic [Nebivolol Hcl] Palpitations and Other (See Comments)   Atorvastatin Other (See Comments)    Cramping in hands and feet   Morphine And Related Itching   Naproxen Hives and Itching   Sertraline Hcl Other (See Comments)    hallucinations   Topiramate Other (See Comments)    Apathetic "out of it"   Venlafaxine Hcl Er Other (See Comments)    Hair loss   Rosuvastatin Other (See Comments)    Cramping in hands and feet and leg muscles    Home Medications    Prior to Admission medications   Medication Sig Start Date End Date Taking? Authorizing Provider  amLODipine (NORVASC) 5 MG tablet Take 5 mg by mouth in the  morning and at bedtime. 08/29/21   [provider]  aspirin EC 81 MG tablet Take 1 tablet (81 mg total) by mouth daily. Swallow whole. 11/15/21   Burnell Blanks, MD  Eptinezumab-jjmr (VYEPTI) 100 MG/ML injection Inject 1 mL (100 mg total) into the vein every 3 (three) months. Patient not taking: Reported on 03/12/2022 02/21/22   Lomax, Amy, NP  Evolocumab (REPATHA SURECLICK) [id number] MG/ML SOAJ Inject 140 mg into the skin every 14 (fourteen) days. 01/10/22   Burnell Blanks, MD  omeprazole (PRILOSEC) 40 MG capsule Take 40 mg by mouth daily. 02/23/22   [provider]  Rimegepant Sulfate (NURTEC) 75 MG TBDP Take 75 mg by mouth daily as needed (take for abortive therapy of migraine, no more than 1 tablet in 24 hours or 10 per month). 10/26/21   Lomax, Amy, NP  traZODone (DESYREL) 50 MG tablet Take 50 mg by mouth at bedtime as needed. 11/07/21   [provider]    Physical Exam    Vital Signs:  ZAYRA MENON does not have vital signs available for review today.  Given telephonic nature of communication, physical exam is limited. AAOx3. NAD. Normal affect.  Speech and respirations are unlabored.  Accessory Clinical Findings    None  Assessment & Plan    1.  Preoperative Cardiovascular Risk Assessment:  Ms. Frantzen perioperative risk of a major cardiac event is 0.9% according to the Revised Cardiac Risk Index (RCRI).  Therefore, she is at low risk for perioperative complications.   Her functional capacity is good at 5.07 METs according to the Duke Activity Status Index (DASI). Recommendations: According to ACC/AHA guidelines, no further cardiovascular testing needed.  The patient may proceed to surgery at acceptable risk.   Antiplatelet and/or Anticoagulation Recommendations: Aspirin can be held for 5-7 days prior to her surgery.  Please resume Aspirin post operatively when it is felt to be safe from a bleeding standpoint.    The patient was advised  that if she develops new symptoms prior to surgery to contact our office to arrange for a follow-up visit, and she verbalized understanding.   A copy of this note will be routed to requesting surgeon.  Time:   Today, I have spent 6 minutes with the patient with telehealth technology discussing medical history, symptoms, and management plan.     Mable Fill, Marissa Nestle, NP  03/13/2022, 3:11 PM

## 2022-03-14 ENCOUNTER — Ambulatory Visit: Payer: Medicare HMO | Attending: Cardiovascular Disease

## 2022-03-14 ENCOUNTER — Telehealth: Payer: Self-pay | Admitting: Pharmacist

## 2022-03-14 DIAGNOSIS — E782 Mixed hyperlipidemia: Secondary | ICD-10-CM

## 2022-03-14 DIAGNOSIS — Z01810 Encounter for preprocedural cardiovascular examination: Secondary | ICD-10-CM | POA: Diagnosis not present

## 2022-03-14 NOTE — Telephone Encounter (Signed)
LDL decreased from 202 to 129 after starting Repatha - suboptimal response with 36% LDL drop. Previously intolerant to atorvastatin '10mg'$  daily and rosuvastatin '10mg'$  daily (muscle cramps). LDL goal < 70 due to hx of TIA and elevated calcium score. Called pt who states she took [place] in January, then there was an issue and her pharmacy didn't fill rx in February so she went a month without Repatha, and she has given 1 dose so far in March. This explains suboptimal response to Repatha. She is back on med now and tolerating well. Will recheck lipids in another 6 weeks to reassess. Can discuss adding on Nexlizet or statin rechallenge at that time if needed.

## 2022-03-16 ENCOUNTER — Other Ambulatory Visit: Payer: Medicare HMO

## 2022-03-19 ENCOUNTER — Other Ambulatory Visit: Payer: Self-pay

## 2022-03-19 ENCOUNTER — Encounter (HOSPITAL_BASED_OUTPATIENT_CLINIC_OR_DEPARTMENT_OTHER): Payer: Self-pay | Admitting: Orthopedic Surgery

## 2022-03-23 NOTE — Progress Notes (Signed)

## 2022-03-26 ENCOUNTER — Ambulatory Visit (HOSPITAL_BASED_OUTPATIENT_CLINIC_OR_DEPARTMENT_OTHER): Payer: Medicare HMO | Admitting: Anesthesiology

## 2022-03-26 ENCOUNTER — Encounter (HOSPITAL_BASED_OUTPATIENT_CLINIC_OR_DEPARTMENT_OTHER)
Admission: RE | Disposition: A | Discharge status: Home / Self Care | Payer: Self-pay | Source: Home / Self Care | Attending: Orthopedic Surgery

## 2022-03-26 ENCOUNTER — Other Ambulatory Visit: Payer: Self-pay

## 2022-03-26 ENCOUNTER — Ambulatory Visit (HOSPITAL_BASED_OUTPATIENT_CLINIC_OR_DEPARTMENT_OTHER)
Admission: RE | Admit: 2022-03-26 | Discharge: 2022-03-26 | Disposition: A | Discharge status: Home / Self Care | Payer: Medicare HMO | Attending: Orthopedic Surgery | Admitting: Orthopedic Surgery

## 2022-03-26 ENCOUNTER — Encounter (HOSPITAL_BASED_OUTPATIENT_CLINIC_OR_DEPARTMENT_OTHER): Payer: Self-pay | Admitting: Orthopedic Surgery

## 2022-03-26 DIAGNOSIS — M25841 Other specified joint disorders, right hand: Secondary | ICD-10-CM | POA: Diagnosis not present

## 2022-03-26 DIAGNOSIS — M19041 Primary osteoarthritis, right hand: Secondary | ICD-10-CM

## 2022-03-26 DIAGNOSIS — F32A Depression, unspecified: Secondary | ICD-10-CM | POA: Diagnosis not present

## 2022-03-26 DIAGNOSIS — I2510 Atherosclerotic heart disease of native coronary artery without angina pectoris: Secondary | ICD-10-CM

## 2022-03-26 DIAGNOSIS — K449 Diaphragmatic hernia without obstruction or gangrene: Secondary | ICD-10-CM | POA: Insufficient documentation

## 2022-03-26 DIAGNOSIS — Z09 Encounter for follow-up examination after completed treatment for conditions other than malignant neoplasm: Secondary | ICD-10-CM | POA: Diagnosis not present

## 2022-03-26 DIAGNOSIS — L729 Follicular cyst of the skin and subcutaneous tissue, unspecified: Secondary | ICD-10-CM

## 2022-03-26 DIAGNOSIS — Z8673 Personal history of transient ischemic attack (TIA), and cerebral infarction without residual deficits: Secondary | ICD-10-CM | POA: Insufficient documentation

## 2022-03-26 DIAGNOSIS — I10 Essential (primary) hypertension: Secondary | ICD-10-CM | POA: Insufficient documentation

## 2022-03-26 DIAGNOSIS — G4730 Sleep apnea, unspecified: Secondary | ICD-10-CM | POA: Diagnosis not present

## 2022-03-26 DIAGNOSIS — M151 Heberden's nodes (with arthropathy): Secondary | ICD-10-CM | POA: Diagnosis not present

## 2022-03-26 DIAGNOSIS — R69 Illness, unspecified: Secondary | ICD-10-CM | POA: Diagnosis not present

## 2022-03-26 DIAGNOSIS — K219 Gastro-esophageal reflux disease without esophagitis: Secondary | ICD-10-CM | POA: Diagnosis not present

## 2022-03-26 DIAGNOSIS — M67441 Ganglion, right hand: Secondary | ICD-10-CM | POA: Diagnosis not present

## 2022-03-26 HISTORY — PX: ROTATION FLAP: SHX6211

## 2022-03-26 HISTORY — PX: CYST EXCISION: SHX5701

## 2022-03-26 SURGERY — CYST REMOVAL
Anesthesia: Monitor Anesthesia Care | Site: Little Finger | Laterality: Right

## 2022-03-26 MED ORDER — HYDROCODONE-ACETAMINOPHEN 5-325 MG PO TABS: ORAL_TABLET | ORAL | 0 refills | Status: DC

## 2022-03-26 MED ORDER — ONDANSETRON HCL 4 MG/2ML IJ SOLN
INTRAMUSCULAR | Status: AC
  Filled 2022-03-26: qty 2

## 2022-03-26 MED ORDER — 0.9 % SODIUM CHLORIDE (POUR BTL) OPTIME
TOPICAL | Status: DC | PRN
  Administered 2022-03-26: 50 mL

## 2022-03-26 MED ORDER — ONDANSETRON HCL 4 MG/2ML IJ SOLN
INTRAMUSCULAR | Status: DC | PRN
  Administered 2022-03-26: 4 mg via INTRAVENOUS

## 2022-03-26 MED ORDER — ACETAMINOPHEN 500 MG PO TABS
1000.0000 mg | ORAL_TABLET | Freq: Once | ORAL | Status: AC
  Administered 2022-03-26: 1000 mg via ORAL

## 2022-03-26 MED ORDER — ACETAMINOPHEN 500 MG PO TABS
ORAL_TABLET | ORAL | Status: AC
  Filled 2022-03-26: qty 2

## 2022-03-26 MED ORDER — FENTANYL CITRATE (PF) 100 MCG/2ML IJ SOLN
INTRAMUSCULAR | Status: AC
  Filled 2022-03-26: qty 2

## 2022-03-26 MED ORDER — BUPIVACAINE HCL (PF) 0.25 % IJ SOLN
INTRAMUSCULAR | Status: DC | PRN
  Administered 2022-03-26: 9 mL
  Administered 2022-03-26: 6 mL

## 2022-03-26 MED ORDER — CEFAZOLIN SODIUM-DEXTROSE 2-4 GM/100ML-% IV SOLN
INTRAVENOUS | Status: AC
  Filled 2022-03-26: qty 100

## 2022-03-26 MED ORDER — CEFAZOLIN SODIUM-DEXTROSE 2-4 GM/100ML-% IV SOLN
2.0000 g | INTRAVENOUS | Status: AC
  Administered 2022-03-26: 2 g via INTRAVENOUS

## 2022-03-26 MED ORDER — LACTATED RINGERS IV SOLN: INTRAVENOUS | Status: DC

## 2022-03-26 MED ORDER — PROPOFOL 500 MG/50ML IV EMUL
INTRAVENOUS | Status: DC | PRN
  Administered 2022-03-26: 150 ug/kg/min via INTRAVENOUS

## 2022-03-26 MED ORDER — FENTANYL CITRATE (PF) 100 MCG/2ML IJ SOLN: 25.0000 ug | INTRAMUSCULAR | Status: DC | PRN

## 2022-03-26 MED ORDER — FENTANYL CITRATE (PF) 100 MCG/2ML IJ SOLN
INTRAMUSCULAR | Status: DC | PRN
  Administered 2022-03-26: 50 ug via INTRAVENOUS
  Administered 2022-03-26 (×2): 25 ug via INTRAVENOUS

## 2022-03-26 SURGICAL SUPPLY — 99 items
APL PRP STRL LF DISP 70% ISPRP (MISCELLANEOUS) ×2
APL SKNCLS STERI-STRIP NONHPOA (GAUZE/BANDAGES/DRESSINGS)
BALL CTTN LRG ABS STRL LF (GAUZE/BANDAGES/DRESSINGS)
BANDAGE GAUZE 1X75IN STRL (MISCELLANEOUS) IMPLANT
BENZOIN TINCTURE PRP APPL 2/3 (GAUZE/BANDAGES/DRESSINGS) IMPLANT
BLADE MINI RND TIP GREEN BEAV (BLADE) IMPLANT
BLADE SURG 15 STRL LF DISP TIS (BLADE) ×6 IMPLANT
BLADE SURG 15 STRL SS (BLADE) ×4
BNDG CMPR 5X2 CHSV 1 LYR STRL (GAUZE/BANDAGES/DRESSINGS)
BNDG CMPR 5X2 KNTD ELC UNQ LF (GAUZE/BANDAGES/DRESSINGS)
BNDG CMPR 5X3 KNIT ELC UNQ LF (GAUZE/BANDAGES/DRESSINGS)
BNDG CMPR 75X11 PLY HI ABS (MISCELLANEOUS)
BNDG CMPR 75X21 PLY HI ABS (MISCELLANEOUS)
BNDG CMPR 9X4 STRL LF SNTH (GAUZE/BANDAGES/DRESSINGS) ×2
BNDG COHESIVE 1X5 TAN STRL LF (GAUZE/BANDAGES/DRESSINGS) ×1 IMPLANT
BNDG COHESIVE 2X5 TAN ST LF (GAUZE/BANDAGES/DRESSINGS) IMPLANT
BNDG ELASTIC 2INX 5YD STR LF (GAUZE/BANDAGES/DRESSINGS) IMPLANT
BNDG ELASTIC 3INX 5YD STR LF (GAUZE/BANDAGES/DRESSINGS) ×2 IMPLANT
BNDG ESMARK 4X9 LF (GAUZE/BANDAGES/DRESSINGS) ×1 IMPLANT
BNDG GAUZE 1X75IN STRL (MISCELLANEOUS)
BNDG GAUZE DERMACEA FLUFF 4 (GAUZE/BANDAGES/DRESSINGS) IMPLANT
BNDG GZE DERMACEA 4 6PLY (GAUZE/BANDAGES/DRESSINGS)
BNDG PLASTER X FAST 3X3 WHT LF (CAST SUPPLIES) IMPLANT
BNDG PLSTR 9X3 FST ST WHT (CAST SUPPLIES)
CATH ROBINSON RED A/P 10FR (CATHETERS) IMPLANT
CHLORAPREP W/TINT 26 (MISCELLANEOUS) ×3 IMPLANT
CORD BIPOLAR FORCEPS 12FT (ELECTRODE) ×3 IMPLANT
COTTONBALL LRG STERILE PKG (GAUZE/BANDAGES/DRESSINGS) IMPLANT
COVER BACK TABLE 60X90IN (DRAPES) ×3 IMPLANT
COVER MAYO STAND STRL (DRAPES) ×3 IMPLANT
CUFF TOURN SGL QUICK 18X4 (TOURNIQUET CUFF) ×3 IMPLANT
DRAPE EXTREMITY T 121X128X90 (DISPOSABLE) ×3 IMPLANT
DRAPE OEC MINIVIEW 54X84 (DRAPES) IMPLANT
DRAPE SURG 17X23 STRL (DRAPES) ×3 IMPLANT
GAUZE 4X4 16PLY ~~LOC~~+RFID DBL (SPONGE) IMPLANT
GAUZE PAD ABD 8X10 STRL (GAUZE/BANDAGES/DRESSINGS) IMPLANT
GAUZE SPONGE 4X4 12PLY STRL (GAUZE/BANDAGES/DRESSINGS) ×3 IMPLANT
GAUZE STRETCH 2X75IN STRL (MISCELLANEOUS) IMPLANT
GAUZE XEROFORM 1X8 LF (GAUZE/BANDAGES/DRESSINGS) ×3 IMPLANT
GLOVE BIO SURGEON STRL SZ7.5 (GLOVE) ×3 IMPLANT
GLOVE BIOGEL PI IND STRL 6.5 (GLOVE) ×1 IMPLANT
GLOVE BIOGEL PI IND STRL 7.5 (GLOVE) ×1 IMPLANT
GLOVE BIOGEL PI IND STRL 8 (GLOVE) ×3 IMPLANT
GLOVE SURG ORTHO 8.0 STRL STRW (GLOVE) IMPLANT
GLOVE SURG SS PI 7.0 STRL IVOR (GLOVE) ×1 IMPLANT
GOWN STRL REUS W/ TWL LRG LVL3 (GOWN DISPOSABLE) ×3 IMPLANT
GOWN STRL REUS W/TWL LRG LVL3 (GOWN DISPOSABLE) ×2
GOWN STRL REUS W/TWL XL LVL3 (GOWN DISPOSABLE) ×3 IMPLANT
K-WIRE DBL .035X4 NSTRL (WIRE)
KWIRE DBL .035X4 NSTRL (WIRE) IMPLANT
LOOP VASCLR MAXI BLUE 18IN ST (MISCELLANEOUS) IMPLANT
LOOP VASCULAR MAXI 18 BLUE (MISCELLANEOUS)
LOOPS VASCLR MAXI BLUE 18IN ST (MISCELLANEOUS) IMPLANT
NDL HYPO 25X1 1.5 SAFETY (NEEDLE) IMPLANT
NDL KEITH (NEEDLE) IMPLANT
NEEDLE HYPO 25X1 1.5 SAFETY (NEEDLE) ×4 IMPLANT
NEEDLE KEITH (NEEDLE) IMPLANT
NS IRRIG 1000ML POUR BTL (IV SOLUTION) ×3 IMPLANT
PACK BASIN DAY SURGERY FS (CUSTOM PROCEDURE TRAY) ×3 IMPLANT
PAD CAST 3X4 CTTN HI CHSV (CAST SUPPLIES) ×2 IMPLANT
PAD CAST 4YDX4 CTTN HI CHSV (CAST SUPPLIES) IMPLANT
PADDING CAST ABS COTTON 3X4 (CAST SUPPLIES) IMPLANT
PADDING CAST ABS COTTON 4X4 ST (CAST SUPPLIES) ×2 IMPLANT
PADDING CAST COTTON 3X4 STRL (CAST SUPPLIES)
PADDING CAST COTTON 4X4 STRL (CAST SUPPLIES)
SLEEVE SCD COMPRESS KNEE MED (STOCKING) IMPLANT
SPIKE FLUID TRANSFER (MISCELLANEOUS) IMPLANT
SPLINT FINGER 3.25 BULB 911905 (SOFTGOODS) ×1 IMPLANT
SPLINT PLASTER CAST XFAST 3X15 (CAST SUPPLIES) IMPLANT
STOCKINETTE 4X48 STRL (DRAPES) ×3 IMPLANT
STRIP CLOSURE SKIN 1/2X4 (GAUZE/BANDAGES/DRESSINGS) IMPLANT
SUT CHROMIC 5 0 P 3 (SUTURE) IMPLANT
SUT ETHIBOND 3-0 V-5 (SUTURE) IMPLANT
SUT ETHILON 3 0 PS 1 (SUTURE) IMPLANT
SUT ETHILON 4 0 PS 2 18 (SUTURE) ×3 IMPLANT
SUT FIBERWIRE 3-0 18 TAPR NDL (SUTURE)
SUT FIBERWIRE 4-0 18 DIAM BLUE (SUTURE)
SUT MERSILENE 2.0 SH NDLE (SUTURE) IMPLANT
SUT MERSILENE 4 0 P 3 (SUTURE) IMPLANT
SUT MNCRL AB 4-0 PS2 18 (SUTURE) IMPLANT
SUT MON AB 5-0 PS2 18 (SUTURE) IMPLANT
SUT PROLENE 2 0 SH DA (SUTURE) IMPLANT
SUT PROLENE 6 0 P 1 18 (SUTURE) IMPLANT
SUT SILK 2 0 PERMA HAND 18 BK (SUTURE) IMPLANT
SUT SILK 4 0 PS 2 (SUTURE) IMPLANT
SUT SUPRAMID 4-0 (SUTURE) IMPLANT
SUT VIC AB 3-0 PS1 18 (SUTURE)
SUT VIC AB 3-0 PS1 18XBRD (SUTURE) IMPLANT
SUT VIC AB 4-0 P-3 18XBRD (SUTURE) IMPLANT
SUT VIC AB 4-0 P3 18 (SUTURE)
SUT VICRYL 4-0 PS2 18IN ABS (SUTURE) IMPLANT
SUTURE FIBERWR 3-0 18 TAPR NDL (SUTURE) IMPLANT
SUTURE FIBERWR 4-0 18 DIA BLUE (SUTURE) IMPLANT
SYR BULB EAR ULCER 3OZ GRN STR (SYRINGE) ×3 IMPLANT
SYR CONTROL 10ML LL (SYRINGE) ×4 IMPLANT
TOWEL GREEN STERILE FF (TOWEL DISPOSABLE) ×6 IMPLANT
TUBE NG 5FR 35IN ENFIT (TUBING) IMPLANT
UNDERPAD 30X36 HEAVY ABSORB (UNDERPADS AND DIAPERS) ×3 IMPLANT
VASCULAR TIE MAXI BLUE 18IN ST (MISCELLANEOUS)

## 2022-03-26 NOTE — Anesthesia Postprocedure Evaluation (Signed)
Anesthesia Post Note  Patient: CANA OBLINGER  Procedure(s) Performed: RIGHT SMALL FINGER EXCISION MUCOID CYST AND DEBRIDEMENT DISTAL INTERPHALANGEAL JOINT (Right: Little Finger) ROTATION FLAP (Right: Little Finger)     Patient location during evaluation: PACU Anesthesia Type: MAC Level of consciousness: awake and alert Pain management: pain level controlled Vital Signs Assessment: post-procedure vital signs reviewed and stable Respiratory status: spontaneous breathing, nonlabored ventilation, respiratory function stable and patient connected to nasal cannula oxygen Cardiovascular status: stable and blood pressure returned to baseline Postop Assessment: no apparent nausea or vomiting Anesthetic complications: no  No notable events documented.  Last Vitals:  Vitals:   03/26/22 1514 03/26/22 1515  BP: (!) 142/86 (!) 142/86  Pulse: 70 66  Resp: 17 13  Temp:  36.4 C  SpO2: 97% 96%    Last Pain:  Vitals:   03/26/22 1515  PainSc: 0-No pain                 Tellis Spivak L Kensleigh Gates

## 2022-03-26 NOTE — Transfer of Care (Signed)
Immediate Anesthesia Transfer of Care Note  Patient: Savannah Taylor  Procedure(s) Performed: RIGHT SMALL FINGER EXCISION MUCOID CYST AND DEBRIDEMENT DISTAL INTERPHALANGEAL JOINT (Right: Little Finger) ROTATION FLAP (Right: Little Finger)  Patient Location: PACU  Anesthesia Type:MAC  Level of Consciousness: awake, alert , and drowsy  Airway & Oxygen Therapy: Patient Spontanous Breathing and Patient connected to face mask oxygen  Post-op Assessment: Report given to RN and Post -op Vital signs reviewed and stable  Post vital signs: Reviewed and stable  Last Vitals:  Vitals Value Taken Time  BP 129/77 03/26/22 1453  Temp    Pulse 83 03/26/22 1455  Resp 17 03/26/22 1455  SpO2 96 % 03/26/22 1455  Vitals shown include unvalidated device data.  Last Pain:  Vitals:   03/26/22 1237  PainSc: 1       Patients Stated Pain Goal: 7 ([id number])  Complications: No notable events documented.

## 2022-03-26 NOTE — Op Note (Signed)
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  Right small finger excision of mucoid cyst 2.  Right small finger debridement of DIP joint 3.  Right small finger rotation flap   SURGEON:  [name], M.D.   ASSISTANT: none   ANESTHESIA:  Local with sedation   INTRAVENOUS FLUIDS:  Per anesthesia flow sheet.   ESTIMATED BLOOD LOSS:  Minimal.   COMPLICATIONS:  None.   SPECIMENS: Mucoid cyst to pathology   TOURNIQUET TIME:    Total Tourniquet Time Documented: Forearm (Right) - 27 minutes Total: Forearm (Right) - 27 minutes    DISPOSITION:  Stable to PACU.   INDICATIONS: 67 year old female with mucoid cyst of right small finger.  This is bothersome to her.  She wishes to have it excised and the DIP joint debrided to try to prevent recurrence.  We discussed possible need for rotation flap for coverage.  Risks, benefits and alternatives of surgery were discussed including the risks of blood loss, infection, damage to nerves, vessels, tendons, ligaments, bone for surgery, need for additional surgery, complications with wound healing, continued pain, stiffness, , recurrence.  She voiced understanding of these risks and elected to proceed.  OPERATIVE COURSE:  After being identified preoperatively by myself,  the patient and I agreed on the procedure and site of the procedure.  The surgical site was marked.  Surgical consent had been signed. Preoperative IV antibiotic prophylaxis was given. She was transferred to the operating room and placed on the operating table in supine position with the right upper extremity on an arm board.  Sedation  was induced by the anesthesiologist.  A surgical pause was performed between the surgeons, anesthesia, and operating room staff and all were in agreement as to the patient, procedure, and site of procedure.  A digital block was performed with quarter percent plain Marcaine.  Right upper extremity was prepped and draped in normal sterile orthopedic fashion.  A surgical pause was performed between the surgeons, anesthesia, and operating room staff and all were in agreement as to the patient, procedure, and site of procedure.  Tourniquet at the proximal aspect of the forearm was inflated to 250 mmHg after exsanguination of the arm with an Esmarch bandage.  A hockey-stick shaped incision was made at the dorsum of the DIP joint and carried in subcutaneous tissues by spreading technique.  The cyst was carefully freed up.  It was filled with clear gelatinous fluid.  It was removed with the synovectomy rongeurs.  The skin over top was very thinned.  The DIP joint was entered underneath the extensor tendon.  The synovectomy rongeurs were used to remove some cyst coming from within the joint.  They were then used to perform synovectomy and remove prominent osteophyte from the dorsum of the distal phalanx.  Some of this osteophyte was within the extensor tendon was not able to be removed.  The skin over top of the cyst was almost completely eroded.  The thickened portion of the skin and eroded skin were excised.  The incision was extended proximally and then back transversely across the finger distal to the PIP joint with a relaxing portion of the incision.  The skin was mobilized preserving the subcutaneous vasculature.  The wound and joint were copiously irrigated with sterile saline.  The skin flap was rotated into the defect.  The skin was reapproximated with 4-0 nylon in horizontal mattress fashion.  Good reapproximation was obtained.  The wound was dressed with sterile Xeroform and 4 x 4 and wrapped with a Coban dressing  lightly.  An AlumaFoam splint was placed and wrapped lightly with Coban dressing.  The tourniquet was deflated at 27 minutes.  Fingertips were pink with brisk capillary refill after deflation of tourniquet.  The operative  drapes were broken down.  The patient was awoken from anesthesia safely.  She was transferred back to the stretcher and taken to PACU in stable condition.  I will see her back in the office in 1 week for postoperative followup.  I will give her a prescription for Norco 5/325 1-2 tabs PO q6 hours prn pain, dispense # 15.   [name], MD Electronically signed, 03/26/22

## 2022-03-26 NOTE — Anesthesia Preprocedure Evaluation (Addendum)
Anesthesia Evaluation  Patient identified by MRN, date of birth, ID band Patient awake    Reviewed: Allergy & Precautions, NPO status , Patient's Chart, lab work & pertinent test results  Airway Mallampati: I  TM Distance: >3 FB Neck ROM: Full    Dental  (+) Edentulous Upper, Upper Dentures, Dental Advisory Given   Pulmonary asthma , sleep apnea (no CPAP)    Pulmonary exam normal breath sounds clear to auscultation       Cardiovascular hypertension, Pt. on medications + CAD  Normal cardiovascular exam Rhythm:Regular Rate:Normal     Neuro/Psych  Headaches, Seizures -, Well Controlled,  PSYCHIATRIC DISORDERS  Depression    CVA, No Residual Symptoms    GI/Hepatic Neg liver ROS, hiatal hernia,GERD  ,,  Endo/Other  negative endocrine ROS    Renal/GU negative Renal ROS  negative genitourinary   Musculoskeletal  (+) Arthritis ,    Abdominal   Peds  Hematology negative hematology ROS (+)   Anesthesia Other Findings   Reproductive/Obstetrics                             Anesthesia Physical Anesthesia Plan  ASA: 3  Anesthesia Plan: MAC   Post-op Pain Management: Tylenol PO (pre-op)*   Induction: Intravenous  PONV Risk Score and Plan: 2 and Propofol infusion, Treatment may vary due to age or medical condition, Midazolam and Ondansetron  Airway Management Planned: Natural Airway  Additional Equipment:   Intra-op Plan:   Post-operative Plan:   Informed Consent: I have reviewed the patients History and Physical, chart, labs and discussed the procedure including the risks, benefits and alternatives for the proposed anesthesia with the patient or authorized representative who has indicated his/her understanding and acceptance.     Dental advisory given  Plan Discussed with: CRNA  Anesthesia Plan Comments:        Anesthesia Quick Evaluation

## 2022-03-26 NOTE — Discharge Instructions (Addendum)
May take Tylenol after 6:30pm, if needed.    Post Anesthesia Home Care Instructions  Activity: Get plenty of rest for the remainder of the day. A responsible individual must stay with you for 24 hours following the procedure.  For the next 24 hours, DO NOT: -Drive a car -Operate machinery -Drink alcoholic beverages -Take any medication unless instructed by your physician -Make any legal decisions or sign important papers.  Meals: Start with liquid foods such as gelatin or soup. Progress to regular foods as tolerated. Avoid greasy, spicy, heavy foods. If nausea and/or vomiting occur, drink only clear liquids until the nausea and/or vomiting subsides. Call your physician if vomiting continues.  Special Instructions/Symptoms: Your throat may feel dry or sore from the anesthesia or the breathing tube placed in your throat during surgery. If this causes discomfort, gargle with warm salt water. The discomfort should disappear within 24 hours.  If you had a scopolamine patch placed behind your ear for the management of post- operative nausea and/or vomiting:  1. The medication in the patch is effective for 72 hours, after which it should be removed.  Wrap patch in a tissue and discard in the trash. Wash hands thoroughly with soap and water. 2. You may remove the patch earlier than 72 hours if you experience unpleasant side effects which may include dry mouth, dizziness or visual disturbances. 3. Avoid touching the patch. Wash your hands with soap and water after contact with the patch.      [facility] Instructions Hand Surgery  Wound Care: Keep your hand elevated above the level of your heart.  Do not allow it to dangle by your side.  Keep the dressing dry and do not remove it unless your doctor advises you to do so.  He will usually change it at the time of your post-op visit.  Moving your fingers is advised to stimulate circulation but will depend on the site of your surgery.  If you have  a splint applied, your doctor will advise you regarding movement.  Activity: Do not drive or operate machinery today.  Rest today and then you may return to your normal activity and work as indicated by your physician.  Diet:  Drink liquids today or eat a light diet.  You may resume a regular diet tomorrow.    General expectations: Pain for two to three days. Fingers may become slightly swollen.  Call your doctor if any of the following occur: Severe pain not relieved by pain medication. Elevated temperature. Dressing soaked with blood. Inability to move fingers. White or bluish color to fingers.  

## 2022-03-26 NOTE — H&P (Signed)
Savannah Taylor is an 67 y.o. female.   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  She wishes to have excision of the cyst and debridement of the dip joint to try to prevent recurrence.  Allergies:  Allergies  Allergen Reactions   Bystolic [Nebivolol Hcl] Palpitations and Other (See Comments)   Atorvastatin Other (See Comments)    Cramping in hands and feet   Morphine And Related Itching   Naproxen Hives and Itching   Sertraline Hcl Other (See Comments)    hallucinations   Topiramate Other (See Comments)    Apathetic "out of it"   Venlafaxine Hcl Er Other (See Comments)    Hair loss   Rosuvastatin Other (See Comments)    Cramping in hands and feet and leg muscles    Past Medical History:  Diagnosis Date   Abdominal cramps    Asthma    Back problem    BAD DISK IN BACK   DDD (degenerative disc disease), lumbosacral    Depression    Gastritis    GERD (gastroesophageal reflux disease)    H/O hiatal hernia    Heart murmur    Years ago   Hyperlipidemia    Hypertension    Insomnia    Knee problem    Leg cramps    Migraine    01/22/2019- last one > 1 month ago   Post concussion syndrome    Seizure ([place])    1 many years ago with a migraine   Sleep apnea    does not use CPAP   Stroke ([place])    TIA  slight memory  issues TIA prior to 2010   Ulcer    STOMACH & SORENESS & PAIN   UTI (urinary tract infection) 07/02/2012    Past Surgical History:  Procedure Laterality Date   ABDOMINAL HYSTERECTOMY     ARTHROGRAM KNEE Right    MCL tear   BACK SURGERY     PLIF L4-5, 5-S1 Dr. Ronnald Ramp 01-2019   BREAST REDUCTION SURGERY Bilateral 05/10/2021   Procedure: MAMMARY REDUCTION  (BREAST);  Surgeon: Wallace Going, DO;  Location: [facility];  Service: Plastics;  Laterality: Bilateral;   COLONOSCOPY     ENDOSCOPY  2020   hiatal hernia, gastritis, and some bacteria in stomach   HEMORRHOID SURGERY       Family History: Family History  Problem Relation Age of Onset   Hypertension Mother    Hyperlipidemia Mother    Asthma Father    Cancer Father    Hypertension Brother    Sleep apnea Son    Breast cancer Neg Hx     Social History:   reports that she has never smoked. She has never used smokeless tobacco. She reports that she does not drink alcohol and does not use drugs.  Medications: Medications Prior to Admission  Medication Sig Dispense Refill   amLODipine (NORVASC) 5 MG tablet Take 5 mg by mouth in the morning and at bedtime.     aspirin EC 81 MG tablet Take 1 tablet (81 mg total) by mouth daily. Swallow whole. 90 tablet 3   Evolocumab (REPATHA SURECLICK) [id number] MG/ML SOAJ Inject 140 mg into the skin every 14 (fourteen) days. 2 mL 11   omeprazole (PRILOSEC) 40 MG capsule Take 40 mg by mouth daily.     Rimegepant Sulfate (NURTEC) 75 MG TBDP Take 75 mg by mouth daily as needed (take  for abortive therapy of migraine, no more than 1 tablet in 24 hours or 10 per month). 8 tablet 11   traZODone (DESYREL) 50 MG tablet Take 50 mg by mouth at bedtime as needed.     Eptinezumab-jjmr (VYEPTI) 100 MG/ML injection Inject 1 mL (100 mg total) into the vein every 3 (three) months. (Patient not taking: Reported on 03/12/2022) 1 mL 3    No results found for this or any previous visit (from the past 48 hour(s)).  No results found.    Blood pressure (!) 133/92, pulse 77, temperature 98.2 F (36.8 C), resp. rate 18, height 5\' 5"  (1.651 m), weight 88 kg, SpO2 100 %.  General appearance: alert, cooperative, and appears stated age Head: Normocephalic, without obvious abnormality, atraumatic Neck: supple, symmetrical, trachea midline Extremities: Intact sensation and capillary refill all digits.  +epl/fpl/io.  No wounds.  Pulses: 2+ and symmetric Skin: Skin color, texture, turgor normal. No rashes or lesions Neurologic: Grossly normal Incision/Wound: none  Assessment/Plan Right small finger  mucoid cyst and dip joint arthritis.  Non operative and operative treatment options have been discussed with the patient and patient wishes to proceed with operative treatment. Risks, benefits, and alternatives of surgery have been discussed and the patient agrees with the plan of care.   Savannah Taylor 03/26/2022, 1:09 PM

## 2022-03-27 ENCOUNTER — Encounter (HOSPITAL_BASED_OUTPATIENT_CLINIC_OR_DEPARTMENT_OTHER): Payer: Self-pay | Admitting: Orthopedic Surgery

## 2022-03-28 LAB — SURGICAL PATHOLOGY

## 2022-04-03 ENCOUNTER — Telehealth: Payer: Self-pay | Admitting: Family Medicine

## 2022-04-03 DIAGNOSIS — G43719 Chronic migraine without aura, intractable, without status migrainosus: Secondary | ICD-10-CM

## 2022-04-03 NOTE — Telephone Encounter (Signed)
Spoke w/ Kim,RN/[facility]. Intake spoke with pt and provided quote of 372.61 per infusion until out of pocket met. Pt did not provide answer on whether she wanted to proceed with infusion. On 03/15/22, Holly called pt and pt told her she cannot afford infusion and did not want to proceed.  [facility] d/c'd order.  Intake had been trying to call pt since 03/08/22 and had LVM several times.    I spoke w/ Amy Lomax,NP. She states the only other option that she has not tried in [employer] injection (CGRP). We can offer this if pt willing to try this.

## 2022-04-03 NOTE — Telephone Encounter (Signed)
Pt called stating that she was to start infusion treatments for her Migraines but it was $300+ and she was told that there was another option but no one has reached out to her to advise her of this other option. Please call back when available.

## 2022-04-04 MED ORDER — EMGALITY 120 MG/ML ~~LOC~~ SOAJ: 1.0000 | SUBCUTANEOUS | 11 refills | Status: DC

## 2022-04-04 MED ORDER — EMGALITY 120 MG/ML ~~LOC~~ SOAJ: 2.0000 | Freq: Once | SUBCUTANEOUS | 0 refills | Status: AC

## 2022-04-04 NOTE — Telephone Encounter (Signed)
Submitted PA Emgality on covermymeds. [id number]. Waiting on determination from [facility].

## 2022-04-04 NOTE — Telephone Encounter (Signed)
Called and spoke with pt. Relayed message below. She is agreeable to try Emgality. Explained this has a loading dose of 2 pens for the first month and then 1 pen per month thereafter for maintenance dose. Aware it may require PA via insurance before she can pick up from pharmacy. Explained different injection sites she can use as well. Asked her to call back if she has any further questions moving forward.

## 2022-04-04 NOTE — Telephone Encounter (Signed)
PA approved. 01/01/2022 - 01/01/2023 

## 2022-04-10 ENCOUNTER — Ambulatory Visit: Payer: Medicare HMO | Admitting: Family Medicine

## 2022-04-20 DIAGNOSIS — B9689 Other specified bacterial agents as the cause of diseases classified elsewhere: Secondary | ICD-10-CM | POA: Diagnosis not present

## 2022-04-20 DIAGNOSIS — E6609 Other obesity due to excess calories: Secondary | ICD-10-CM | POA: Diagnosis not present

## 2022-04-20 DIAGNOSIS — J019 Acute sinusitis, unspecified: Secondary | ICD-10-CM | POA: Diagnosis not present

## 2022-04-23 ENCOUNTER — Ambulatory Visit: Payer: Medicare HMO | Attending: Cardiovascular Disease

## 2022-04-23 DIAGNOSIS — E782 Mixed hyperlipidemia: Secondary | ICD-10-CM

## 2022-04-24 ENCOUNTER — Ambulatory Visit: Payer: Medicare HMO

## 2022-04-24 ENCOUNTER — Other Ambulatory Visit: Payer: Medicare HMO

## 2022-04-24 LAB — LIPID PANEL
Chol/HDL Ratio: 2.8 ratio (ref 0.0–4.4)
Cholesterol, Total: 192 mg/dL (ref 100–199)
HDL: 69 mg/dL (ref >39)
LDL Chol Calc (NIH): 112 mg/dL — ABNORMAL HIGH (ref 0–99)
Triglycerides: 61 mg/dL (ref 0–149)
VLDL Cholesterol Cal: 11 mg/dL (ref 5–40)

## 2022-04-25 ENCOUNTER — Telehealth: Payer: Self-pay | Admitting: Pharmacist

## 2022-04-25 ENCOUNTER — Telehealth: Payer: Self-pay | Admitting: Cardiovascular Disease

## 2022-04-25 DIAGNOSIS — E782 Mixed hyperlipidemia: Secondary | ICD-10-CM

## 2022-04-25 MED ORDER — PRAVASTATIN SODIUM 20 MG PO TABS: 20.0000 mg | ORAL_TABLET | Freq: Every evening | ORAL | 11 refills | Status: DC

## 2022-04-25 NOTE — Telephone Encounter (Signed)
Spoke with pt, wants a med that would not affect her kidney function. Nexlizet can increase SCr by ~5%, pravastatin would not affect kidney function. She is agreeable to try low dose pravastatin, would like to take  daily for first  week, then increase to  daily. Will call with any side effects. Otherwise, she will continue on Repatha twice monthly. Will recheck lipids in 2-3 months. Pt appreciative for the call.

## 2022-04-25 NOTE — Telephone Encounter (Addendum)
LDL decreased from baseline of 202 to 112 after starting Repatha (45% LDL drop). Last panel a month ago, pt had been off her Repatha and had only given 1 dose before labs were checked. Labs better now but LDL still [id number]$10mge cramps). Will call pt to discuss either adding on Nexlizet or rechallenging with another statin.

## 2022-04-25 NOTE — Telephone Encounter (Signed)
Patient called stating she had a lipid panel done and wants to know if Dr. Clifton James can review them as well.

## 2022-04-25 NOTE — Telephone Encounter (Signed)
Addressed in other phone note.  

## 2022-05-18 DIAGNOSIS — M79644 Pain in right finger(s): Secondary | ICD-10-CM | POA: Diagnosis not present

## 2022-05-18 DIAGNOSIS — M6740 Ganglion, unspecified site: Secondary | ICD-10-CM | POA: Diagnosis not present

## 2022-05-18 DIAGNOSIS — M25641 Stiffness of right hand, not elsewhere classified: Secondary | ICD-10-CM | POA: Diagnosis not present

## 2022-06-07 DIAGNOSIS — M6740 Ganglion, unspecified site: Secondary | ICD-10-CM | POA: Diagnosis not present

## 2022-06-07 DIAGNOSIS — M79644 Pain in right finger(s): Secondary | ICD-10-CM | POA: Diagnosis not present

## 2022-06-07 DIAGNOSIS — M25641 Stiffness of right hand, not elsewhere classified: Secondary | ICD-10-CM | POA: Diagnosis not present

## 2022-06-12 ENCOUNTER — Other Ambulatory Visit: Payer: Self-pay | Admitting: Family Medicine

## 2022-06-12 DIAGNOSIS — R140 Abdominal distension (gaseous): Secondary | ICD-10-CM

## 2022-06-12 DIAGNOSIS — N1831 Chronic kidney disease, stage 3a: Secondary | ICD-10-CM | POA: Diagnosis not present

## 2022-06-12 DIAGNOSIS — K5909 Other constipation: Secondary | ICD-10-CM | POA: Diagnosis not present

## 2022-06-12 DIAGNOSIS — R1010 Upper abdominal pain, unspecified: Secondary | ICD-10-CM | POA: Diagnosis not present

## 2022-06-12 DIAGNOSIS — I700 Atherosclerosis of aorta: Secondary | ICD-10-CM | POA: Diagnosis not present

## 2022-06-21 DIAGNOSIS — M6740 Ganglion, unspecified site: Secondary | ICD-10-CM | POA: Diagnosis not present

## 2022-06-21 DIAGNOSIS — M25641 Stiffness of right hand, not elsewhere classified: Secondary | ICD-10-CM | POA: Diagnosis not present

## 2022-06-21 DIAGNOSIS — M79644 Pain in right finger(s): Secondary | ICD-10-CM | POA: Diagnosis not present

## 2022-06-25 DIAGNOSIS — I10 Essential (primary) hypertension: Secondary | ICD-10-CM | POA: Diagnosis not present

## 2022-06-25 DIAGNOSIS — I700 Atherosclerosis of aorta: Secondary | ICD-10-CM | POA: Diagnosis not present

## 2022-06-25 DIAGNOSIS — G4700 Insomnia, unspecified: Secondary | ICD-10-CM | POA: Diagnosis not present

## 2022-06-25 DIAGNOSIS — N1831 Chronic kidney disease, stage 3a: Secondary | ICD-10-CM | POA: Diagnosis not present

## 2022-06-25 DIAGNOSIS — R293 Abnormal posture: Secondary | ICD-10-CM | POA: Diagnosis not present

## 2022-06-25 DIAGNOSIS — Z23 Encounter for immunization: Secondary | ICD-10-CM | POA: Diagnosis not present

## 2022-06-25 DIAGNOSIS — Z1159 Encounter for screening for other viral diseases: Secondary | ICD-10-CM | POA: Diagnosis not present

## 2022-06-25 DIAGNOSIS — E538 Deficiency of other specified B group vitamins: Secondary | ICD-10-CM | POA: Diagnosis not present

## 2022-06-25 DIAGNOSIS — Z0000 Encounter for general adult medical examination without abnormal findings: Secondary | ICD-10-CM | POA: Diagnosis not present

## 2022-06-25 DIAGNOSIS — K5901 Slow transit constipation: Secondary | ICD-10-CM | POA: Diagnosis not present

## 2022-06-25 DIAGNOSIS — E559 Vitamin D deficiency, unspecified: Secondary | ICD-10-CM | POA: Diagnosis not present

## 2022-06-25 DIAGNOSIS — E7800 Pure hypercholesterolemia, unspecified: Secondary | ICD-10-CM | POA: Diagnosis not present

## 2022-06-25 LAB — LAB REPORT - SCANNED
EGFR: 58
HM Hepatitis Screen: NEGATIVE

## 2022-06-26 ENCOUNTER — Telehealth: Payer: Self-pay | Admitting: Cardiovascular Disease

## 2022-06-26 DIAGNOSIS — E782 Mixed hyperlipidemia: Secondary | ICD-10-CM

## 2022-06-26 NOTE — Telephone Encounter (Signed)
Caller stated patient recently had labs drawn at their office and they will be faxing results.  Caller wants call back to patient to confirm whether or not she should still have labs drawn on 7/8.

## 2022-06-27 ENCOUNTER — Encounter: Payer: Medicare HMO | Attending: Psychology | Admitting: Psychology

## 2022-06-27 ENCOUNTER — Other Ambulatory Visit: Payer: Self-pay | Admitting: Family Medicine

## 2022-06-27 DIAGNOSIS — R413 Other amnesia: Secondary | ICD-10-CM | POA: Insufficient documentation

## 2022-06-27 DIAGNOSIS — M542 Cervicalgia: Secondary | ICD-10-CM | POA: Diagnosis not present

## 2022-06-27 DIAGNOSIS — G43709 Chronic migraine without aura, not intractable, without status migrainosus: Secondary | ICD-10-CM | POA: Insufficient documentation

## 2022-06-27 DIAGNOSIS — E2839 Other primary ovarian failure: Secondary | ICD-10-CM

## 2022-06-27 DIAGNOSIS — R41840 Attention and concentration deficit: Secondary | ICD-10-CM | POA: Insufficient documentation

## 2022-06-27 DIAGNOSIS — R4789 Other speech disturbances: Secondary | ICD-10-CM | POA: Diagnosis not present

## 2022-06-27 NOTE — Telephone Encounter (Signed)
Called patient and let her know there are recent labs scanned into her chart showing lipids/liver and I will cancel the lab appointment on 07/09/22.  She is grateful her LDL is coming down.  She reports having cramping that started after she started pravastatin in April this year.  She hates to stop the medicine but is having trouble tolerating the cramping.    I told her I would let PharmD know about her symptoms and see what if any changes should be made.

## 2022-06-29 MED ORDER — EZETIMIBE 10 MG PO TABS: 10.0000 mg | ORAL_TABLET | Freq: Every day | ORAL | 11 refills | Status: DC

## 2022-06-29 NOTE — Telephone Encounter (Addendum)
Baseline labs 10/12/21: TC 284, TG 94, HDL 66, LDL 202.  04/23/22: TC 192, TG 61, HDL 69, LDL 112 - on Repatha (45% LDL drop). Previously intolerant to atorvastatin 10mg  daily and rosuvastatin 10mg  daily (myalgias). LDL goal < 70 due to TIA and elevated CAC. She did not want med that would affect her kidney function so avoided bempedoic acid (can increase SCr ~5%). She started on low dose pravastatin 10mg  then titrated up to 20mg  daily.  Most recent labs scanned in from 06/25/22: TC 156, TG 73, HDL 63, LDL 79. LDL has dropped an additional 30% since adding on low dose pravastatin and is close to her goal of 70, however pt now reporting cramping on pravastatin.  Called pt, she is taking pravastatin 20mg  daily but experiencing cramping. Discussed decreasing to 10mg  or trying Zetia. She thinks she took Zetia before, says it was stopped due to potential kidney effects. Discussed that Zetia does not cause renal dysfunction and is safe for her to use. States her old PCP who no longer works told her that. She wishes to try Zetia again. Rx sent in, she is aware to stop her pravastatin and monitor for myalgia improvement over the next week, then start Zetia. She will continue Repatha injections twice monthly. I have moved her 7/6 labs out to September. She will call with any concerns before then.

## 2022-06-29 NOTE — Addendum Note (Signed)
Addended by: Jarelle Ates E on: 06/29/2022 12:45 PM   Modules accepted: Orders

## 2022-07-06 ENCOUNTER — Other Ambulatory Visit: Payer: Medicare HMO

## 2022-07-09 ENCOUNTER — Ambulatory Visit: Payer: Medicare HMO

## 2022-07-12 ENCOUNTER — Encounter: Payer: Medicare HMO | Attending: Psychology

## 2022-07-12 DIAGNOSIS — R41840 Attention and concentration deficit: Secondary | ICD-10-CM | POA: Diagnosis not present

## 2022-07-12 DIAGNOSIS — R413 Other amnesia: Secondary | ICD-10-CM | POA: Diagnosis not present

## 2022-07-12 DIAGNOSIS — G43709 Chronic migraine without aura, not intractable, without status migrainosus: Secondary | ICD-10-CM

## 2022-07-12 DIAGNOSIS — R4789 Other speech disturbances: Secondary | ICD-10-CM

## 2022-07-12 DIAGNOSIS — G43009 Migraine without aura, not intractable, without status migrainosus: Secondary | ICD-10-CM | POA: Diagnosis not present

## 2022-07-13 DIAGNOSIS — K219 Gastro-esophageal reflux disease without esophagitis: Secondary | ICD-10-CM | POA: Diagnosis not present

## 2022-07-13 DIAGNOSIS — I2510 Atherosclerotic heart disease of native coronary artery without angina pectoris: Secondary | ICD-10-CM | POA: Diagnosis not present

## 2022-07-13 DIAGNOSIS — E669 Obesity, unspecified: Secondary | ICD-10-CM | POA: Diagnosis not present

## 2022-07-13 DIAGNOSIS — J45909 Unspecified asthma, uncomplicated: Secondary | ICD-10-CM | POA: Diagnosis not present

## 2022-07-13 DIAGNOSIS — G43909 Migraine, unspecified, not intractable, without status migrainosus: Secondary | ICD-10-CM | POA: Diagnosis not present

## 2022-07-13 DIAGNOSIS — E785 Hyperlipidemia, unspecified: Secondary | ICD-10-CM | POA: Diagnosis not present

## 2022-07-13 DIAGNOSIS — N1831 Chronic kidney disease, stage 3a: Secondary | ICD-10-CM | POA: Diagnosis not present

## 2022-07-13 DIAGNOSIS — G4733 Obstructive sleep apnea (adult) (pediatric): Secondary | ICD-10-CM | POA: Diagnosis not present

## 2022-07-13 DIAGNOSIS — M545 Low back pain, unspecified: Secondary | ICD-10-CM | POA: Diagnosis not present

## 2022-07-13 DIAGNOSIS — Z008 Encounter for other general examination: Secondary | ICD-10-CM | POA: Diagnosis not present

## 2022-07-13 DIAGNOSIS — M1990 Unspecified osteoarthritis, unspecified site: Secondary | ICD-10-CM | POA: Diagnosis not present

## 2022-07-17 NOTE — Progress Notes (Signed)
Behavioral Observations:  The patient appeared well-groomed and appropriately dressed. Her manners were polite and appropriate to the situation. The patient's attitude towards testing was generally positive and her effort was good. The patient became frustrated during the verbal paired associates subtest of the memory scale and thus did not complete that portion of the WMS.  Neuropsychology Note  Savannah Taylor completed 150 minutes of neuropsychological testing with technician, Staci Acosta, BA, under the supervision of Arley Phenix, PsyD., Clinical Neuropsychologist. The patient did not appear overtly distressed by the testing session, per behavioral observation or via self-report to the technician. Rest breaks were offered.   Clinical Decision Making: In considering the patient's current level of functioning, level of presumed impairment, nature of symptoms, emotional and behavioral responses during clinical interview, level of literacy, and observed level of motivation/effort, a battery of tests was selected by Dr. Kieth Brightly during initial consultation on 06/27/2022. This was communicated to the technician. Communication between the neuropsychologist and technician was ongoing throughout the testing session and changes were made as deemed necessary based on patient performance on testing, technician observations and additional pertinent factors such as those listed above.  Tests Administered: Wechsler Adult Intelligence Scale, 4th Edition (WAIS-IV) Wechsler Memory Scale, 4th Edition (WMS-IV); Adult Battery  Results:  WAIS-IV:  Composite Score Summary  Scale Sum of Scaled Scores Composite Score Percentile Rank 95% Conf. Interval Qualitative Description  Verbal Comprehension 23 VCI 87 19 82-93 Low Average  Perceptual Reasoning 21 PRI 82 12 77-89 Low Average  Working Memory 13 WMI 80 9 74-88 Low Average  Processing Speed 15 PSI 86 18 79-96 Low Average  Full Scale 72 FSIQ 80 9  76-84 Low Average  General Ability 44 GAI 82 12 78-87 Low Average    Verbal Comprehension Subtests Summary  Subtest Raw Score Scaled Score Percentile Rank Reference Group Scaled Score SEM  Similarities 20 8 25 7  1.04  Vocabulary 32 9 37 9 0.67  Information 8 6 9 7  0.73  The scaled scores in the Reference Group Scaled Score column are based on the performance of examinees aged 20:0-34:11 (i.e., the reference group). See Chapter 6 of the WAIS-IV Technical and Interpretive Manual for more information.  Perceptual Reasoning Subtests Summary  Subtest Raw Score Scaled Score Percentile Rank Reference Group Scaled Score SEM  Block Design 24 8 25 6  1.08  Matrix Reasoning 7 6 9 3  0.90  Visual Puzzles 8 7 16 6  0.85   Working Librarian, academic Raw Score Scaled Score Percentile Rank Reference Group Scaled Score SEM  Digit Span 20 7 16 6  0.79  Arithmetic 9 6 9 6  0.99   Processing Speed Subtests Summary  Subtest Raw Score Scaled Score Percentile Rank Reference Group Scaled Score SEM  Symbol Search 19 7 16 5  1.31  Coding 43 8 25 5  0.99    WMS-IV: Brief Cognitive Status Exam Classification  Age Years of Education Raw Score Classification Level Base Rate  67 years 3 months 14 54 Average 100.0    Index Score Summary  Index Sum of Scaled Scores Index Score Percentile Rank 95% Confidence Interval Qualitative Descriptor  Auditory Memory (AMI) 16 64 1 60-72 Extremely Low  Visual Memory (VMI) 29 84 14 79-90 Low Average  Visual Working Memory (VWMI) 16 88 21 82-96 Low Average  Immediate Memory (IMI) 25 75 5 70-82 Borderline  Delayed Memory (DMI) 20 66 1 61-75 Extremely Low     Primary Subtest Scaled Score Summary  Subtest  Domain Raw Score Scaled Score Percentile Rank  Logical Memory I AM 17 6 9   Logical Memory II AM 13 7 16   Verbal Paired Associates I AM 2 2 0.4  Verbal Paired Associates II AM 0 1 0.1  Designs I VM 53 8 25  Designs II VM 17 1 0.1  Visual Reproduction I VM  30 9 37  Visual Reproduction II VM 24 11 63  Spatial Addition VWM 7 8 25   Symbol Span VWM 14 8 25     Auditory Memory Process Score Summary  Process Score Raw Score Scaled Score Percentile Rank Cumulative Percentage (Base Rate)  LM II Recognition 21 - - 17-25%  VPA II Recognition 0 - - <=2%   Visual Memory Process Score Summary  Process Score Raw Score Scaled Score Percentile Rank Cumulative Percentage (Base Rate)  DE I Content 31 9 37 -  DE I Spatial 14 9 37 -  DE II Content 7 1 0.1 -  DE II Spatial 4 3 1  -  DE II Recognition 10 - - 3-9%  VR II Recognition 4 - - 26-50%   ABILITY-MEMORY ANALYSIS  Ability Score:  VCI: 87 Date of Testing:  WAIS-IV; WMS-IV 2022/07/12  Predicted Difference Method   Index Predicted WMS-IV Index Score Actual WMS-IV Index Score Difference Critical Value  Significant Difference Y/N Base Rate  Auditory Memory 93 64 29 10.15 Y 1-2%  Visual Memory 94 84 10 9.30 Y 20-25%  Visual Working Memory 93 88 5 11.68 N   Immediate Memory 92 75 17 11.15 Y 5-10%  Delayed Memory 93 66 27 11.49 Y 2%  Statistical significance (critical value) at the .01 level.    Feedback to Patient: Savannah Taylor will return on 05/14/2023 for an interactive feedback session with Dr. Kieth Brightly at which time her test performances, clinical impressions and treatment recommendations will be reviewed in detail. The patient understands she can contact our office should she require our assistance before this time.  150 minutes spent face-to-face with patient administering standardized tests, 30 minutes spent scoring Radiographer, therapeutic). [CPT [id number], [id number]]  Full report to follow.

## 2022-07-19 DIAGNOSIS — R41840 Attention and concentration deficit: Secondary | ICD-10-CM | POA: Diagnosis not present

## 2022-07-19 DIAGNOSIS — R413 Other amnesia: Secondary | ICD-10-CM

## 2022-07-19 DIAGNOSIS — G43009 Migraine without aura, not intractable, without status migrainosus: Secondary | ICD-10-CM | POA: Diagnosis not present

## 2022-07-19 DIAGNOSIS — R4789 Other speech disturbances: Secondary | ICD-10-CM | POA: Diagnosis not present

## 2022-07-20 ENCOUNTER — Other Ambulatory Visit: Payer: Medicare HMO

## 2022-07-23 ENCOUNTER — Other Ambulatory Visit: Payer: Medicare HMO

## 2022-07-24 NOTE — Progress Notes (Signed)
   Behavioral Observations:  The patient appeared well-groomed and appropriately dressed. Her manners were polite and appropriate to the situation. The patient's attitude towards testing was positive and her effort was good.  Neuropsychology Note  Savannah Taylor completed 90 minutes of neuropsychological testing with technician, Staci Acosta, BA, under the supervision of Arley Phenix, PsyD., Clinical Neuropsychologist. The patient did not appear overtly distressed by the testing session, per behavioral observation or via self-report to the technician. Rest breaks were offered.   Clinical Decision Making: In considering the patient's current level of functioning, level of presumed impairment, nature of symptoms, emotional and behavioral responses during clinical interview, level of literacy, and observed level of motivation/effort, a battery of tests was selected by Dr. Kieth Brightly during initial consultation on 06/27/2022. This was communicated to the technician. Communication between the neuropsychologist and technician was ongoing throughout the testing session and changes were made as deemed necessary based on patient performance on testing, technician observations and additional pertinent factors such as those listed above.  Tests Administered: Comprehensive Attention Battery (CAB) Continuous Performance Test (CPT)  Results: Will be included in final report   Feedback to Patient: Savannah Taylor will return on 05/14/2023 for an interactive feedback session with Dr. Kieth Brightly at which time her test performances, clinical impressions and treatment recommendations will be reviewed in detail. The patient understands she can contact our office should she require our assistance before this time.  90 minutes spent face-to-face with patient administering standardized tests, 30 minutes spent scoring Radiographer, therapeutic). [CPT [id number], [id number]]  Full report to follow.

## 2022-08-01 ENCOUNTER — Telehealth: Payer: Self-pay | Admitting: Cardiovascular Disease

## 2022-08-01 NOTE — Telephone Encounter (Signed)
Baseline LDL 202, improved to 112 after starting Repatha. LDL goal < 70. Intolerant to atorvastatin 10mg , rosuvastatin 10mg  daily, pravastatin 20mg  daily, and now ezetimibe 10mg  daily. Didn't want med that would affect her kidneys so have been avoiding bempedoic acid. Only option at this point is to retry another statin if she's willing, potentially rosuvastatin 5mg  a few days a week.  Called pt to discuss, no answer, left message.

## 2022-08-01 NOTE — Telephone Encounter (Signed)
Pt c/o medication issue:  1. Name of Medication: Ezetimibe  2. How are you currently taking this medication (dosage and times per day)?   3. Are you having a reaction (difficulty breathing--STAT)? no  4. What is your medication issue? legs and feet cramps

## 2022-08-03 ENCOUNTER — Ambulatory Visit
Admission: RE | Admit: 2022-08-03 | Discharge: 2022-08-03 | Disposition: A | Discharge status: Home / Self Care | Payer: Medicare HMO | Source: Ambulatory Visit | Attending: Family Medicine | Admitting: Family Medicine

## 2022-08-03 DIAGNOSIS — N281 Cyst of kidney, acquired: Secondary | ICD-10-CM | POA: Diagnosis not present

## 2022-08-03 DIAGNOSIS — R140 Abdominal distension (gaseous): Secondary | ICD-10-CM | POA: Diagnosis not present

## 2022-08-03 DIAGNOSIS — R109 Unspecified abdominal pain: Secondary | ICD-10-CM | POA: Diagnosis not present

## 2022-08-03 DIAGNOSIS — R110 Nausea: Secondary | ICD-10-CM | POA: Diagnosis not present

## 2022-08-03 MED ORDER — ROSUVASTATIN CALCIUM 5 MG PO TABS: 5.0000 mg | ORAL_TABLET | ORAL | 3 refills | Status: DC

## 2022-08-03 NOTE — Telephone Encounter (Signed)
Spoke w patient. Reviewed input from PharmD w patient.  She has been off Zetia for almost a week and her symptoms are gone.  She is willing to try Crestor 5 mg - working up to every other day but starting w twice a week.  Prescription sent in.

## 2022-08-21 DIAGNOSIS — M19041 Primary osteoarthritis, right hand: Secondary | ICD-10-CM | POA: Diagnosis not present

## 2022-08-21 DIAGNOSIS — M6740 Ganglion, unspecified site: Secondary | ICD-10-CM | POA: Diagnosis not present

## 2022-08-21 NOTE — Patient Instructions (Signed)
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 May use with Tylenol as needed. We will resubmit orders for Vyepti. Please call me if you have not heard back to schedule by 09/04/2022. Call Dr Marvetta Gibbons office to ask if there may be a waiting list that you could get on to be seen for neurocog results prior to 05/2023.   Please make sure you are staying well hydrated. I recommend 50-60 ounces daily. Well balanced diet and regular exercise encouraged. Consistent sleep schedule with 6-8 hours recommended.   Please continue follow up with care team as directed.   Follow up with me in 6 months   You may receive a survey regarding today's visit. I encourage you to leave honest feed back as I do use this information to improve patient care. Thank you for seeing me today!    GENERAL HEADACHE INFORMATION:   Natural supplements: Magnesium Oxide or Magnesium Glycinate 500 mg at bed (up to 800 mg daily) Coenzyme Q10 300 mg in AM Vitamin B2- 200 mg twice a day   Add 1 supplement at a time since even natural supplements can have undesirable side effects. You can sometimes buy supplements cheaper (especially Coenzyme Q10) at www.WebmailGuide.co.za or at [facility].  Migraine with aura: There is increased risk for stroke in women with migraine with aura and a contraindication for the combined contraceptive pill for use by women who have migraine with aura. The risk for women with migraine without aura is lower. However other risk factors like smoking are far more likely to increase stroke risk than migraine. There is a recommendation for no smoking and for the use of OCPs without estrogen such as progestogen only pills particularly for women with migraine with aura.Marland Kitchen People who have migraine headaches with auras may be 3 times more likely to have a stroke caused by a blood clot, compared to migraine patients who don't see auras. Women who take hormone-replacement therapy may be 30 percent more likely to  suffer a clot-based stroke than women not taking medication containing estrogen. Other risk factors like smoking and high blood pressure may be  much more important.    Vitamins and herbs that show potential:   Magnesium: Magnesium (250 mg twice a day or 500 mg at bed) has a relaxant effect on smooth muscles such as blood vessels. Individuals suffering from frequent or daily headache usually have low magnesium levels which can be increase with daily supplementation of 400-750 mg. Three trials found 40-90% average headache reduction  when used as a preventative. Magnesium may help with headaches are aura, the best evidence for magnesium is for migraine with aura is its thought to stop the cortical spreading depression we believe is the pathophysiology of migraine aura.Magnesium also demonstrated the benefit in menstrually related migraine.  Magnesium is part of the messenger system in the serotonin cascade and it is a good muscle relaxant.  It is also useful for constipation which can be a side effect of other medications used to treat migraine. Good sources include nuts, whole grains, and tomatoes. Side Effects: loose stool/diarrhea  Riboflavin (vitamin B 2) 200 mg twice a day. This vitamin assists nerve cells in the production of ATP a principal energy storing molecule.  It is necessary for many chemical reactions in the body.  There have been at least 3 clinical trials of riboflavin using 400 mg per day all of which suggested that migraine frequency can be decreased.  All 3 trials showed significant  improvement in over half of migraine sufferers.  The supplement is found in bread, cereal, milk, meat, and poultry.  Most Americans get more riboflavin than the recommended daily allowance, however riboflavin deficiency is not necessary for the supplements to help prevent headache. Side effects: energizing, green urine   Coenzyme Q10: This is present in almost all cells in the body and is critical component for  the conversion of energy.  Recent studies have shown that a nutritional supplement of CoQ10 can reduce the frequency of migraine attacks by improving the energy production of cells as with riboflavin.  Doses of 150 mg twice a day have been shown to be effective.   Melatonin: Increasing evidence shows correlation between melatonin secretion and headache conditions.  Melatonin supplementation has decreased headache intensity and duration.  It is widely used as a sleep aid.  Sleep is natures way of dealing with migraine.  A dose of 3 mg is recommended to start for headaches including cluster headache. Higher doses up to 15 mg has been reviewed for use in Cluster headache and have been used. The rationale behind using melatonin for cluster is that many theories regarding the cause of Cluster headache center around the disruption of the normal circadian rhythm in the brain.  This helps restore the normal circadian rhythm.   HEADACHE DIET: Foods and beverages which may trigger migraine Note that only 20% of headache patients are food sensitive. You will know if you are food sensitive if you get a headache consistently 20 minutes to 2 hours after eating a certain food. Only cut out a food if it causes headaches, otherwise you might remove foods you enjoy! What matters most for diet is to eat a well balanced healthy diet full of vegetables and low fat protein, and to not miss meals.   Chocolate, other sweets ALL cheeses except cottage and cream cheese Dairy products, yogurt, sour cream, ice cream Liver Meat extracts (Bovril, Marmite, meat tenderizers) Meats or fish which have undergone aging, fermenting, pickling or smoking. These include: Hotdogs,salami,Lox,sausage, mortadellas,smoked salmon, pepperoni, Pickled herring Pods of broad bean (English beans, Chinese pea pods, [place] (fava) beans, lima and navy beans Ripe avocado, ripe banana Yeast extracts or active yeast preparations such as Brewer's or  Fleishman's (commercial bakes goods are permitted) Tomato based foods, pizza (lasagna, etc.)   MSG (monosodium glutamate) is disguised as many things; look for these common aliases: Monopotassium glutamate Autolysed yeast Hydrolysed protein Sodium caseinate "flavorings" "all natural preservatives" Nutrasweet   Avoid all other foods that convincingly provoke headaches.   Resources: The Dizzy [place] Laundry Your Headache Diet, migrainestrong.com  https://zamora-andrews.com/   Caffeine and Migraine For patients that have migraine, caffeine intake more than 3 days per week can lead to dependency and increased migraine frequency. I would recommend cutting back on your caffeine intake as best you can. The recommended amount of caffeine is 200-300 mg daily, although migraine patients may experience dependency at even lower doses. While you may notice an increase in headache temporarily, cutting back will be helpful for headaches in the long run. For more information on caffeine and migraine, visit: https://americanmigrainefoundation.org/resource-library/caffeine-and-migraine/   Headache Prevention Strategies:   1. Maintain a headache diary; learn to identify and avoid triggers.  - This can be a simple note where you log when you had a headache, associated symptoms, and medications used - There are several smartphone apps developed to help track migraines: Migraine Buddy, Migraine Monitor, Curelator N1-Headache App   Common triggers include: Emotional triggers:  Emotional/Upset family or friends Emotional/Upset occupation Business reversal/success Anticipation anxiety Crisis-serious Post-crisis periodNew job/position   Physical triggers: Vacation Day Weekend Strenuous Exercise High Altitude Location New Move Menstrual Day Physical Illness Oversleep/Not enough sleep Weather changes Light: Photophobia or light sesnitivity treatment involves  a balance between desensitization and reduction in overly strong input. Use dark polarized glasses outside, but not inside. Avoid bright or fluorescent light, but do not dim environment to the point that going into a normally lit room hurts. Consider FL-41 tint lenses, which reduce the most irritating wavelengths without blocking too much light.  These can be obtained at axonoptics.com or theraspecs.com Foods: see list above.   2. Limit use of acute treatments (over-the-counter medications, triptans, etc.) to no more than 2 days per week or 10 days per month to prevent medication overuse headache (rebound headache).     3. Follow a regular schedule (including weekends and holidays): Don't skip meals. Eat a balanced diet. 8 hours of sleep nightly. Minimize stress. Exercise 30 minutes per day. Being overweight is associated with a 5 times increased risk of chronic migraine. Keep well hydrated and drink 6-8 glasses of water per day.   4. Initiate non-pharmacologic measures at the earliest onset of your headache. Rest and quiet environment. Relax and reduce stress. Breathe2Relax is a free app that can instruct you on    some simple relaxtion and breathing techniques. Http://Dawnbuse.com is a    free website that provides teaching videos on relaxation.  Also, there are  many apps that   can be downloaded for "mindful" relaxation.  An app called YOGA NIDRA will help walk you through mindfulness. Another app called Calm can be downloaded to give you a structured mindfulness guide with daily reminders and skill development. Headspace for guided meditation Mindfulness Based Stress Reduction Online Course: www.palousemindfulness.com Cold compresses.   5. Don't wait!! Take the maximum allowable dosage of prescribed medication at the first sign of migraine.   6. Compliance:  Take prescribed medication regularly as directed and at the first sign of a migraine.   7. Communicate:  Call your physician when  problems arise, especially if your headaches change, increase in frequency/severity, or become associated with neurological symptoms (weakness, numbness, slurred speech, etc.). Proceed to emergency room if you experience new or worsening symptoms or symptoms do not resolve, if you have new neurologic symptoms or if headache is severe, or for any concerning symptom.   8. Headache/pain management therapies: Consider various complementary methods, including medication, behavioral therapy, psychological counselling, biofeedback, massage therapy, acupuncture, dry needling, and other modalities.  Such measures may reduce the need for medications. Counseling for pain management, where patients learn to function and ignore/minimize their pain, seems to work very well.   9. Recommend changing family's attention and focus away from patient's headaches. Instead, emphasize daily activities. If first question of day is 'How are your headaches/Do you have a headache today?', then patient will constantly think about headaches, thus making them worse. Goal is to re-direct attention away from headaches, toward daily activities and other distractions.   10. Helpful Websites: www.AmericanHeadacheSociety.org PatentHood.ch www.headaches.org TightMarket.nl www.achenet.org    Management of Memory Problems   There are some general things you can do to help manage your memory problems.  Your memory may not in fact recover, but by using techniques and strategies you will be able to manage your memory difficulties better.   1)  Establish a routine. Try to establish and then stick to a regular routine.  By doing this, you will get used to what to expect and you will reduce the need to rely on your memory.  Also, try to do things at the same time of day, such as taking your medication or checking your calendar first thing in the morning. Think about think that you can do as a part of a regular routine and make a  list.  Then enter them into a daily planner to remind you.  This will help you establish a routine.   2)  Organize your environment. Organize your environment so that it is uncluttered.  Decrease visual stimulation.  Place everyday items such as keys or cell phone in the same place every day (ie.  Basket next to front door) Use post it notes with a brief message to yourself (ie. Turn off light, lock the door) Use labels to indicate where things go (ie. Which cupboards are for food, dishes, etc.) Keep a notepad and pen by the telephone to take messages   3)  Memory Aids A diary or journal/notebook/daily planner Making a list (shopping list, chore list, to do list that needs to be done) Using an alarm as a reminder (kitchen timer or cell phone alarm) Using cell phone to store information (Notes, Calendar, Reminders) Calendar/White board placed in a prominent position Post-it notes   In order for memory aids to be useful, you need to have good habits.  It's no good remembering to make a note in your journal if you don't remember to look in it.  Try setting aside a certain time of day to look in journal.   4)  Improving mood and managing fatigue. There may be other factors that contribute to memory difficulties.  Factors, such as anxiety, depression and tiredness can affect memory. Regular gentle exercise can help improve your mood and give you more energy. Exercise: there are short videos created by the [employer] on Health specially for older adults: https://bit.ly/2I30q97.  Mediterranean diet: which emphasizes fruits, vegetables, whole grains, legumes, fish, and other seafood; unsaturated fats such as olive oils; and low amounts of red meat, eggs, and sweets. A variation of this, called MIND (Mediterranean-DASH Intervention for Neurodegenerative Delay) incorporates the DASH (Dietary Approaches to Stop Hypertension) diet, which has been shown to lower high blood pressure, a risk factor  for Alzheimer's disease. More information at: ExitMarketing.de.  Aerobic exercise that improve heart health is also good for the mind.  [employer] on Aging have short videos for exercises that you can do at home: BlindWorkshop.com.pt Simple relaxation techniques may help relieve symptoms of anxiety Try to get back to completing activities or hobbies you enjoyed doing in the past. Learn to pace yourself through activities to decrease fatigue. Find out about some local support groups where you can share experiences with others. Try and achieve 7-8 hours of sleep at night.   Tasks to improve attention/working memory 1. Good sleep hygiene (7-8 hrs of sleep) 2. Learning a new skill (Painting, Carpentry, Pottery, new language, Knitting). 3.Cognitive exercises (keep a daily journal, Puzzles) 4. Physical exercise and training  (30 min/day X 4 days week) 5. Being on Antidepressant if needed 6.Yoga, Meditation, Tai Chi 7. Decrease alcohol intake 8.Have a clear schedule and structure in daily routine   MIND Diet: The Mediterranean-DASH Diet Intervention for Neurodegenerative Delay, or MIND diet, targets the health of the aging brain. Research participants with the highest MIND diet scores had a significantly slower rate of cognitive decline compared with those with  the lowest scores. The effects of the MIND diet on cognition showed greater effects than either the Mediterranean or the DASH diet alone.   The healthy items the MIND diet guidelines suggest include:   3+ servings a day of whole grains 1+ servings a day of vegetables (other than green leafy) 6+ servings a week of green leafy vegetables 5+ servings a week of nuts 4+ meals a week of beans 2+ servings a week of berries 2+ meals a week of poultry 1+ meals a week of fish Mainly olive oil if added fat is used   The unhealthy items, which are higher in  saturated and trans fat, include: Less than 5 servings a week of pastries and sweets Less than 4 servings a week of red meat (including beef, pork, lamb, and products made from these meats) Less than one serving a week of cheese and fried foods Less than 1 tablespoon a day of butter/stick margarine

## 2022-08-21 NOTE — Progress Notes (Unsigned)
PATIENT: Savannah Taylor DOB: 1955-08-19  REASON FOR VISIT: follow up HISTORY FROM: patient  No chief complaint on file.    HISTORY OF PRESENT ILLNESS:  08/21/22 ALL: Savannah Taylor returns for follow up for migraines. She was last seen 02/2022 and having worsening headaches and constipation on Qulipta. We attempted to start Vyepti infusions but she was unable to afford. We switched to Emgality injections. Since,   Nurtec   Neurocog eval was scheduled 06/2022 but she no showed. Appt rescheduled for 05/2023.   CPAP?  02/21/2022 ALL: Savannah Taylor returns for follow up for migraines. We switched her from Ajovy to [place] at last visit 10/2021. Nurtec used for abortive therapy. She called early 2024 reporting worsening headaches. Steroid taper called in and was helpful. She has had more constipation. She continues to have regular headaches.. She has at least 15 headache days per month. 8-10 are described as migrainous. BP has been well managed. She is working on [employer] with increasing her walking. She is now on trazodone for insomnia through PCP.   She was seen in consult with Dr Lucia Gaskins 11/28/2021 for memory loss. MOCA 23/30. MRI and labs were unremarkable. Referral for formal neurocognitive testing placed. She has an appt with Dr Kieth Brightly 06/27/2022.   Medications tried and failed: Qulipta, Amovig, Ajovy, Topamax, verapamil, amitriptyline, Depakote, Inderal, Effexor, Cymbalta, Nurtec, Ubrelvy, Imitrex injections, Maxalt, Neurontin and Botox.   10/26/2021 ALL: Savannah Taylor returns for follow up for migraines. We switched her from Amovig to Ajovy in 06/2021. She has not noted much benefit. She feels that the headaches may be less severe the week or two following her injection but then headaches return. She reports having headaches most every day . She has 10 severe migraine days. Nurtec helps with some headaches and not with others. HST did not show any significant sleep breathing disorder. BP has been  normal.   06/29/2021 ALL: Savannah Taylor returns for follow up. She was last seen 01/2020. She was educated on the importance of consistency with CGRP injections and advised to consider sleep study. Since, she reports headaches were well managed. She has continued Amovig consistently every month. She has had a milder nagging headache for the past three weeks. Nurtec is not helping. She has been monitoring her BP at home. Readings having been 140's-150's/80-90. She was started on losartan 50mg  QD about 2 weeks ago.   Medications tried and failed: Amovig, Topamax, verapamil, amitriptyline, Depakote, Inderal, Effexor, Cymbalta, Nurtec, Ubrelvy, Imitrex injections, Maxalt, Neurontin and Botox.   Exp 11/2024 Lot [id number]  01/07/2020 ALL:  She returns today for migraine follow up. She was not taking Amovig consistently at last visit. She was reeducated on dosing and advised to take 1 injection every month. She took injections Oct, Nov and Dec. She does feel that Amovig works to help reduce frequency and intensity of migraines for about 2 weeks. Week 3 and 4 she has more headaches. We also started Nurtec for abortive therapy. She reports that it works well to decrease intensity but does not completely relieve headache. She called mid December reporting an intractable migraine. Depacon infusion provided some benefit but headache returned shortly after infusion. We had her take Nurtec daily for three days and started steroid taper. She reports that she continues to have headache. Pain is across the front of her head (more so on left). It is described as a dull swollen/tight feeling.   We have discussed concerns of sleep apnea. She does not sleep well. She wakes up  multiple times a night. She often wakes with headaches. She does snore. She never feels rested when she wakes. She has been hesitant to consider workup due to financial concerns.   We received a referral from PCP for chronic muscle cramps. Cramps are generalized  and effect upper and lower extremities. PCP performed lab eval that did not reveal any significant etiology for symptoms. She has had tingling/pins needles sensation for over a year. Sensation comes and goes. She feels both legs are numb from tips of her toes to the knee on both sides but worse on the left. She tried gabapentin but was unable to tolerate due to brain fog. She takes 600mg  1-2 times weekly as needed. She reports that she still is not recovered from lumbar fusion surgery in 01/2019. Robaxin prescribed by neurosurgery. She was referred to Dr Murray Hodgkins for pain management. She reports having a normal NCS/EMG many years ago. She reports that she cant hold her neck in certain positions or her neck with lock up. She feels that her hands lock up if she tries to hold anything for any period of time. She has spasms of her legs. No incontinence.   She is taking oxycodone as needed for back pain. She denies daily use. She has filled 30 tablets on 11/17/2019 and 12/14/2019. She also has alprazolam and clonazepam on her medication list. She is not certain how often she takes either of these. Per PDMP, she alternates filling alprazolam and clonazepam. She reports that she has ongoing depression and anxiety. She was taking Wellbutrin at last visit but reports that she has stopped this medicaiton sometime since last being seen. She is not sure when but didn't like how she felt on it.    History (copied from previous notes)  Savannah Taylor is a 67 y.o. female here today for follow up migraines. She continues to have regular headaches. She has not reached out to [place] to report headaches have continues. She has some sort of headache every day. She has at least 16-20 migraines every month. She reports taking Amovig every 3 months. Reports that she has only gotten 1 injection every three months. Amovig helps significantly after injection. She does not use abortive medication. She did not feel Ubrelvy helped. She did  not continue venlafaxine. She recently lost her mother. She continues to grieve her loss. She cries frequently. PCP has been treating depression with Wellbutrin but she hs not taken consistently. She is not sleeping well. She is always tired. She wakes frequently at night with headaches or to use the bathroom. She does snore. We have discussed concerns of sleep apnea in the past but she has been reluctant to consider workup. Her son has sleep apnea.    HISTORY: (copied from my note on 07/30/2018)  JAHKAYLA BARRERO is a 67 y.o. female here today for follow up for migraines and concerns of sleep apnea. She reports that headaches are unchanged. She continues to have frequent migraines but feels they are not as intense. She is caring for her mother with end stage dementia and cancer. She is taking venlafaxine 75mg  sporadically when she feels she needs it. She has taken Ubrelvy once but is insure if it helped.    She has not followed through with sleep apnea evaluation. She was uncertain if insurance would cover it. She is interested in continuing with evaluation.    HISTORY: (copied from my note on 03/27/2018)   EIMAN EHRGOTT is a 67 y.o. female for  follow up. She is taking topiramate 100mg  daily and Amovig 140mg  monthly for management of migraines.  She reports that her headaches continue to occur daily.  She has mild pain with most headaches but does report at least 10 migraines per month.  Migraines are typically unilateral and associated with nausea, light and sound sensitivity.  Last month she took an extra dose of Aimovig and reports that that had helped some.  She states that the first 2 to 3 weeks after taking Aimovig she feels that she is well managed, however, the next week or so her migraines are pretty bad.  She is taking Zomig for abortive therapy but reports it is no longer helping.  She has been on multiple preventative medications including Topamax, verapamil, amitriptyline, Depakote,  Inderal, Imitrex injections, Maxalt, Neurontin and Botox.  She is also tried Effexor and Cymbalta.  Headaches have not changed in nature.  Last imaging reviewed was a CT of her head in 2014 that was unremarkable.   She reports about 10 years ago she had a work-up for sleep apnea.  She is unsure of the results of her test but does mention that she was told if she ever reaches a deep stage of sleep.  She does endorse snoring, frequent waking, nocturia, dry mouth, occasional morning headaches, daytime sleepiness and fatigue.  She is interested in an updated sleep study.     History (copied from [employer] note dated 06/18/17)    Ms. Karnes is a 67 year old female with a history of migraine.  She states that she had a severe headache for the last 2 to 3 weeks.  She is on last day of her prednisone Dosepak.  Reports prednisone has been beneficial.  Reports that she still has a mild headache but not as severe as it was before.  She states that her headaches typically occur on the left side.  She always has phonophobia with her headaches.  She remains on Topamax.  She also uses Zomig as needed.  She reports that Aimovig worked well for her migraines in the first 2 months.  She states by the third month she began having a constant dull headache.  She is currently taking the 70 mg injection.  She returns today for evaluation.   HISTORY:Pennelope S Fitton is a 67 y.o. female here as a referral from Dr. Hyman Hopes for migraines. Past medical history of chronic migraines, depression, hypertension, high cholesterol, B12 deficiency, chronic insomnia, chronic kidney disease, multilevel degenerative disease. She has been seeing Dr. Neale Burly for nerve injections. Migraines since a teenager without any inciting event or head trauma. She has a family history of migraines, Her son and paternal aunt have migraines. She has been having injections of the [facility], she went every month and then every 2 months (likely  nerve block) but she also says that she received Botox which helped but insurance would not pay anymore. Now she just goes as needed. For nerve blocks. She has headaches 15 days a month. 8-10 are migrainous. This is been ongoing for the last 6 months. Her migraines feele like her head is swelling on one side, but they can move around, one eye droops, feels tight, she can hear her heart beating in her head especially when laying down, more pounding than pressure, she can't stand sound or noise. Dark rooms help. Being quiet helps. They can last for days straight. They can be severe with nausea and vomiting on average out of 10 can be 6-7/10.  She has been to "so many doctors and so many places" and the doctors get frustrated because they can't help. She has been to headaches clinics. She was told she was faking in the past. She has tried everything per patient. No other associated symptoms or focal neurologic deficits or complaints. No aura. No medication overuse.    Tried: Topamax, verapamil, amitriptyline, depakote, inderal, she has had imitrex injections, maxalt, zomig. Neurontin, botox helped a lot but then her physician said insurance wouldn;t pay, effexor, cymbalta.    Reviewed notes, labs and imaging from outside physicians, which showed:   BUN 12 and creatinine 1.15 taken 04/05/2015. TSH 0.810 January 2017.   CT of the head 04/2012 showed No acute intracranial abnormalities including mass lesion or mass effect, hydrocephalus, extra-axial fluid collection, midline shift, hemorrhage, or acute infarction, large ischemic events (personally reviewed images)    REVIEW OF SYSTEMS: Out of a complete 14 system review of symptoms, the patient complains only of the following symptoms, headaches, depression, insomnia, snoring, muscle cramps, chronic pain and all other reviewed systems are negative.   ALLERGIES: Allergies  Allergen Reactions   Bystolic [Nebivolol Hcl] Palpitations and Other (See Comments)    Atorvastatin Other (See Comments)    Cramping in hands and feet on 10mg  daily   Morphine And Codeine Itching   Naproxen Hives and Itching   Pravastatin     Muscle aches on 20mg  daily   Sertraline Hcl Other (See Comments)    hallucinations   Topiramate Other (See Comments)    Apathetic "out of it"   Venlafaxine Hcl Er Other (See Comments)    Hair loss   Zetia [Ezetimibe]     Cramping on 10mg  daily   Rosuvastatin Other (See Comments)    Cramping in hands and feet and leg muscles on 10mg  daily    HOME MEDICATIONS: Outpatient Medications Prior to Visit  Medication Sig Dispense Refill   amLODipine (NORVASC) 5 MG tablet Take 5 mg by mouth in the morning and at bedtime.     aspirin EC 81 MG tablet Take 1 tablet (81 mg total) by mouth daily. Swallow whole. 90 tablet 3   EMGALITY 120 MG/ML SOAJ Inject 1 Pen into the skin every 30 (thirty) days. 1 mL 11   Eptinezumab-jjmr (VYEPTI) 100 MG/ML injection Inject 1 mL (100 mg total) into the vein every 3 (three) months. (Patient not taking: Reported on 03/12/2022) 1 mL 3   Evolocumab (REPATHA SURECLICK) 140 MG/ML SOAJ Inject 140 mg into the skin every 14 (fourteen) days. 2 mL 11   HYDROcodone-acetaminophen (NORCO/VICODIN) 5-325 MG tablet 1-2 tabs PO q6 hours prn pain 15 tablet 0   omeprazole (PRILOSEC) 40 MG capsule Take 40 mg by mouth daily.     Rimegepant Sulfate (NURTEC) 75 MG TBDP Take 75 mg by mouth daily as needed (take for abortive therapy of migraine, no more than 1 tablet in 24 hours or 10 per month). 8 tablet 11   rosuvastatin (CRESTOR) 5 MG tablet Take 1 tablet (5 mg total) by mouth every other day. 45 tablet 3   traZODone (DESYREL) 50 MG tablet Take 50 mg by mouth at bedtime as needed.     No facility-administered medications prior to visit.    PAST MEDICAL HISTORY: Past Medical History:  Diagnosis Date   Abdominal cramps    Asthma    Back problem    BAD DISK IN BACK   DDD (degenerative disc disease), lumbosacral    Depression  Gastritis    GERD (gastroesophageal reflux disease)    H/O hiatal hernia    Heart murmur    Years ago   Hyperlipidemia    Hypertension    Insomnia    Knee problem    Leg cramps    Migraine    01/22/2019- last one > 1 month ago   Post concussion syndrome    Seizure (HCC)    1 many years ago with a migraine   Sleep apnea    does not use CPAP   Stroke (HCC)    TIA  slight memory  issues TIA prior to 2010   Ulcer    STOMACH & SORENESS & PAIN   UTI (urinary tract infection) 07/02/2012    PAST SURGICAL HISTORY: Past Surgical History:  Procedure Laterality Date   ABDOMINAL HYSTERECTOMY     ARTHROGRAM KNEE Right    MCL tear   BACK SURGERY     PLIF L4-5, 5-S1 Dr. Yetta Barre 01-2019   BREAST REDUCTION SURGERY Bilateral 05/10/2021   Procedure: MAMMARY REDUCTION  (BREAST);  Surgeon: Peggye Form, DO;  Location: [facility];  Service: Plastics;  Laterality: Bilateral;   COLONOSCOPY     CYST EXCISION Right 03/26/2022   Procedure: RIGHT SMALL FINGER EXCISION MUCOID CYST AND DEBRIDEMENT DISTAL INTERPHALANGEAL JOINT;  Surgeon: Betha Loa, MD;  Location: [facility];  Service: Orthopedics;  Laterality: Right;  30 MIN   ENDOSCOPY  2020   hiatal hernia, gastritis, and some bacteria in stomach   HEMORRHOID SURGERY     ROTATION FLAP Right 03/26/2022   Procedure: ROTATION FLAP;  Surgeon: Betha Loa, MD;  Location: [facility];  Service: Orthopedics;  Laterality: Right;    FAMILY HISTORY: Family History  Problem Relation Age of Onset   Hypertension Mother    Hyperlipidemia Mother    Asthma Father    Cancer Father    Hypertension Brother    Sleep apnea Son    Breast cancer Neg Hx     SOCIAL HISTORY: Social History   Socioeconomic History   Marital status: Married    Spouse name: Eddie   Number of children: 3   Years of education: Some college   Highest education level: Not on file  Occupational History   Occupation: works full  time  Tobacco Use   Smoking status: Never   Smokeless tobacco: Never  Vaping Use   Vaping status: Never Used  Substance and Sexual Activity   Alcohol use: No   Drug use: No   Sexual activity: Not Currently    Birth control/protection: Surgical  Other Topics Concern   Not on file  Social History Narrative   Lives with husband   Caffeine use: Coffee/tea/soda sometimes per patient   Social Determinants of Health   Financial Resource Strain: Not on file  Food Insecurity: No Food Insecurity (09/13/2021)   Hunger Vital Sign    Worried About Running Out of Food in the Last Year: Never true    Ran Out of Food in the Last Year: Never true  Transportation Needs: No Transportation Needs (09/13/2021)   PRAPARE - Administrator, Civil Service (Medical): No    Lack of Transportation (Non-Medical): No  Physical Activity: Not on file  Stress: Not on file  Social Connections: Not on file  Intimate Partner Violence: Not on file      PHYSICAL EXAM  There were no vitals filed for this visit.     There  is no height or weight on file to calculate BMI.  Generalized: Well developed, in no acute distress  Cardiology: normal rate and rhythm, no murmur noted Respiratory: clear to auscultation bilaterally  Neurological examination  Mentation: Alert oriented to time, place, history taking. Follows all commands speech and language fluent Cranial nerve II-XII: Pupils were equal round reactive to light. Extraocular movements were full, visual field were full  Motor: The motor testing reveals 5 over 5 strength of all 4 extremities. Good symmetric motor tone is noted throughout.  Gait and station: Gait is normal.      DIAGNOSTIC DATA (LABS, IMAGING, TESTING) - I reviewed patient records, labs, notes, testing and imaging myself where available.      No data to display           Lab Results  Component Value Date   WBC 6.9 07/07/2020   HGB 13.5 07/07/2020   HCT 40.6  07/07/2020   MCV 89.8 07/07/2020   PLT 333 07/07/2020      Component Value Date/Time   NA 140 11/28/2021 1140   K 4.5 11/28/2021 1140   CL 104 11/28/2021 1140   CO2 23 11/28/2021 1140   GLUCOSE 88 11/28/2021 1140   GLUCOSE 100 (H) 07/07/2020 1750   BUN 12 11/28/2021 1140   CREATININE 1.06 (H) 11/28/2021 1140   CALCIUM 9.6 11/28/2021 1140   PROT 6.5 03/13/2022 1003   ALBUMIN 4.2 03/13/2022 1003   AST 16 03/13/2022 1003   ALT 10 03/13/2022 1003   ALKPHOS 114 03/13/2022 1003   BILITOT 0.3 03/13/2022 1003   GFRNONAA >60 07/07/2020 1750   GFRAA >60 01/22/2019 1030   Lab Results  Component Value Date   CHOL 192 04/23/2022   HDL 69 04/23/2022   LDLCALC 112 (H) 04/23/2022   TRIG 61 04/23/2022   CHOLHDL 2.8 04/23/2022   No results found for: "HGBA1C" Lab Results  Component Value Date   VITAMINB12 >2000 (H) 11/28/2021   No results found for: "TSH"   ASSESSMENT AND PLAN 67 y.o. year old female  has a past medical history of Abdominal cramps, Asthma, Back problem, DDD (degenerative disc disease), lumbosacral, Depression, Gastritis, GERD (gastroesophageal reflux disease), H/O hiatal hernia, Heart murmur, Hyperlipidemia, Hypertension, Insomnia, Knee problem, Leg cramps, Migraine, Post concussion syndrome, Seizure (HCC), Sleep apnea, Stroke (HCC), Ulcer, and UTI (urinary tract infection) (07/02/2012). here with   No diagnosis found.   Mrs Stoldt reports headaches continue on Qulipta and she is experiencing constipation. We will discontinue Qulipta and start Vyepti infusions every 12 weeks. She will continue Nurtec for migraine abortion. She may use Tylenol and Benadryl. Avoid Nsaids d/t stomach ulcer. Triptan not effective. May consider nasal CGRP in future if needed. BP has been stable. HST normal. She will see Dr Kieth Brightly for formal neurocognitive testing as scheduled. She will focus on healthy lifestyle habits as discussed.  We will plan to follow-up in 4-6 months. She verbalizes  understanding and agreement with this plan.   No orders of the defined types were placed in this encounter.    No orders of the defined types were placed in this encounter.     Shawnie Dapper, FNP-C 08/21/2022, 4:46 PM [facility] [address], [address] [place], [place] [address] [phone]

## 2022-08-22 ENCOUNTER — Encounter: Payer: Self-pay | Admitting: Family Medicine

## 2022-08-22 ENCOUNTER — Ambulatory Visit: Payer: Medicare HMO | Admitting: Family Medicine

## 2022-08-22 VITALS — BP 118/83 | HR 83 | Ht 65.0 in | Wt 194.5 lb

## 2022-08-22 DIAGNOSIS — G43719 Chronic migraine without aura, intractable, without status migrainosus: Secondary | ICD-10-CM | POA: Diagnosis not present

## 2022-08-22 MED ORDER — NURTEC 75 MG PO TBDP: 75.0000 mg | ORAL_TABLET | Freq: Every day | ORAL | 11 refills | Status: DC | PRN

## 2022-08-23 ENCOUNTER — Telehealth: Payer: Self-pay | Admitting: *Deleted

## 2022-08-23 NOTE — Telephone Encounter (Signed)
   Order given to [facility] in infusion.

## 2022-08-23 NOTE — Telephone Encounter (Signed)
Order placed on Amy lomax, NP desk to be signed.

## 2022-08-23 NOTE — Telephone Encounter (Signed)
-----   Message from Savannah Taylor sent at 08/22/2022  3:29 PM EDT ----- Can you guys please check on the Vyepti orders placed back in 02/2022. Patient states she never heard back to schedule. I would like to start her on Vypeti 100mg  via IV every 12 weeks. TY!

## 2022-08-28 DIAGNOSIS — N281 Cyst of kidney, acquired: Secondary | ICD-10-CM | POA: Insufficient documentation

## 2022-08-30 DIAGNOSIS — N281 Cyst of kidney, acquired: Secondary | ICD-10-CM | POA: Diagnosis not present

## 2022-08-30 NOTE — Telephone Encounter (Signed)
Pt is calling back to report that the cost of the Vepti is not affordable for her and she would like a call to discuss other options.

## 2022-08-30 NOTE — Telephone Encounter (Signed)
Amy please see the below.

## 2022-08-31 ENCOUNTER — Telehealth: Payer: Self-pay | Admitting: Cardiovascular Disease

## 2022-08-31 NOTE — Telephone Encounter (Signed)
Pt c/o medication issue:  1. Name of Medication:   rosuvastatin (CRESTOR) 5 MG tablet    2. How are you currently taking this medication (dosage and times per day)?   Take 1 tablet (5 mg total) by mouth every other day.    3. Are you having a reaction (difficulty breathing--STAT)? No  4. What is your medication issue? Pt states that medication is causing her to have leg and hand cramps. She would like a callback regarding this matter. Please advise

## 2022-08-31 NOTE — Telephone Encounter (Signed)
Returned Pts call. Pt stated she started back on rosuvastatin 1 tablet 3 days a week and was ok. Then went to 1 every other day and had some cramping but not bad. Once pt went back to everyday the cramps started and she stated last night she was cutting with a knife and her hand cramped up so bad she could not hold the knife. She states she is also have leg cramps in the calf area mostly. Advised pt to go back to taking 1 tablet every other day to see if cramps go away and to let us know next week if she is feeling any better going back to that. I advised pt we may need to make another lipid appointment for her to see if there are any more options and that I would forward our message to Dr. Clifton James and PharmD to see if they have any other instructions or advice. Pt stated understanding.

## 2022-09-04 ENCOUNTER — Encounter: Payer: Self-pay | Admitting: Family Medicine

## 2022-09-05 NOTE — Telephone Encounter (Signed)
Message noted from Amy " Mychart message sent to patient.

## 2022-09-06 MED ORDER — EMGALITY 120 MG/ML ~~LOC~~ SOAJ: 120.0000 mg | SUBCUTANEOUS | 2 refills | Status: DC

## 2022-09-06 NOTE — Telephone Encounter (Signed)
Pt called wanting to know if Emgality can be called in for her. Please advise.

## 2022-09-07 DIAGNOSIS — K449 Diaphragmatic hernia without obstruction or gangrene: Secondary | ICD-10-CM | POA: Diagnosis not present

## 2022-09-07 DIAGNOSIS — K760 Fatty (change of) liver, not elsewhere classified: Secondary | ICD-10-CM | POA: Diagnosis not present

## 2022-09-07 DIAGNOSIS — K828 Other specified diseases of gallbladder: Secondary | ICD-10-CM | POA: Diagnosis not present

## 2022-09-07 DIAGNOSIS — N281 Cyst of kidney, acquired: Secondary | ICD-10-CM | POA: Diagnosis not present

## 2022-09-07 DIAGNOSIS — I700 Atherosclerosis of aorta: Secondary | ICD-10-CM | POA: Diagnosis not present

## 2022-09-07 DIAGNOSIS — K838 Other specified diseases of biliary tract: Secondary | ICD-10-CM | POA: Diagnosis not present

## 2022-09-10 NOTE — Telephone Encounter (Signed)
Received [employer] PAP for Vyepti 100mg  IV, application from pt.  Completed to Shawnie Dapper NP for review and completion.

## 2022-09-13 DIAGNOSIS — G43701 Chronic migraine without aura, not intractable, with status migrainosus: Secondary | ICD-10-CM | POA: Diagnosis not present

## 2022-09-13 NOTE — Telephone Encounter (Signed)
Pt coming in for infusion, migraine cocktail at 1500 today.

## 2022-09-13 NOTE — Telephone Encounter (Signed)
I spoke with the patient and offered infusion versus steroid Dosepak.  Patient chose infusion today.  She will be here by 3 PM.  She will not have a driver.  Spoke with Amy.  Order has been given to intrafusion for Depacon 1 g x 1, Solu-Medrol 125 mg IV x 1, and Zofran 4 mg PO x 1.

## 2022-09-17 ENCOUNTER — Ambulatory Visit: Payer: Medicare HMO

## 2022-09-17 NOTE — Telephone Encounter (Signed)
Received [employer] PAP approval for Vyepti Case # [id number]. 09-14-2022 thru 01-01-2023   [phone], support center.   Copy to [employer].

## 2022-09-18 ENCOUNTER — Ambulatory Visit: Payer: Medicare HMO | Attending: Cardiovascular Disease

## 2022-09-18 DIAGNOSIS — E782 Mixed hyperlipidemia: Secondary | ICD-10-CM | POA: Diagnosis not present

## 2022-09-25 ENCOUNTER — Encounter: Payer: Self-pay | Admitting: Psychology
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
## 2022-10-03 ENCOUNTER — Telehealth: Payer: Self-pay

## 2022-10-03 NOTE — Telephone Encounter (Signed)
Tresa Endo from [employer] called wanting to know which therapy the pt will be using either Vyepti or cont Emgality. Please call [phone] and speak to the Pharmacist when decision is reached.
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
## 2022-10-23 DIAGNOSIS — G43701 Chronic migraine without aura, not intractable, with status migrainosus: Secondary | ICD-10-CM | POA: Diagnosis not present

## 2022-11-05 ENCOUNTER — Other Ambulatory Visit: Payer: Self-pay | Admitting: Cardiovascular Disease

## 2022-11-05 DIAGNOSIS — R931 Abnormal findings on diagnostic imaging of heart and coronary circulation: Secondary | ICD-10-CM

## 2022-11-05 DIAGNOSIS — E782 Mixed hyperlipidemia: Secondary | ICD-10-CM

## 2022-11-13 DIAGNOSIS — H52209 Unspecified astigmatism, unspecified eye: Secondary | ICD-10-CM | POA: Diagnosis not present

## 2022-11-13 DIAGNOSIS — H5203 Hypermetropia, bilateral: Secondary | ICD-10-CM | POA: Diagnosis not present

## 2022-11-13 DIAGNOSIS — H524 Presbyopia: Secondary | ICD-10-CM | POA: Diagnosis not present

## 2022-11-14 ENCOUNTER — Other Ambulatory Visit: Payer: Self-pay | Admitting: *Deleted

## 2022-11-14 ENCOUNTER — Telehealth: Payer: Self-pay | Admitting: Family Medicine

## 2022-11-14 NOTE — Telephone Encounter (Signed)
I called pt and she is doing better after the first infusion 10-23-2022,level is decreased from 7-8 to 3-4. She still with breakthru 2nd-3rd week after ivyepti infusion of every other day headache.  She has not taken anything acutely.   I told her that she can take nurtec acutely for breakthru migraines. She did have some,  will take one now and then tylenol/benadryl later if needed.   Appreciated callback.  Her next infusion is 01-15-2023.

## 2022-11-14 NOTE — Telephone Encounter (Signed)
Pt states the migraine infusion treatment has helped, but she still has break thru headaches that are painful.  Pt would like to know if something can be called in for her, please call.

## 2022-11-19 ENCOUNTER — Other Ambulatory Visit: Payer: Self-pay | Admitting: Family Medicine

## 2022-11-19 DIAGNOSIS — Z1231 Encounter for screening mammogram for malignant neoplasm of breast: Secondary | ICD-10-CM

## 2022-12-20 ENCOUNTER — Ambulatory Visit
Admission: RE | Admit: 2022-12-20 | Discharge: 2022-12-20 | Disposition: A | Discharge status: Home / Self Care | Payer: Medicare HMO | Source: Ambulatory Visit | Attending: Family Medicine | Admitting: Family Medicine

## 2022-12-20 DIAGNOSIS — Z1231 Encounter for screening mammogram for malignant neoplasm of breast: Secondary | ICD-10-CM

## 2022-12-21 ENCOUNTER — Telehealth: Payer: Self-pay | Admitting: Cardiovascular Disease

## 2022-12-21 NOTE — Telephone Encounter (Signed)
*  STAT* If patient is at the pharmacy, call can be transferred to refill team.   1. Which medications need to be refilled? (please list name of each medication and dose if known) rosuvastatin (CRESTOR) 5 MG tablet    2. Would you like to learn more about the convenience, safety, & potential cost savings by using the [facility]?   3. Are you open to using the [facility] (Type [facility]. ).   4. Which pharmacy/location (including street and city if local pharmacy) is medication to be sent to? [facility] - [place] ([place] - [address]    5. Do they need a 30 day or 90 day supply? 90

## 2022-12-25 ENCOUNTER — Telehealth: Payer: Self-pay | Admitting: Family Medicine

## 2022-12-25 NOTE — Telephone Encounter (Signed)
Call to patient and she states that she needs new paperwork for vyepti, that the one for october is not good for a year. She gets infused here at [facility].  She is not sure when her next infusion but it is sometime in January. I advised I would let staff know to follow up on Monday 12/30. New order wrote and left in POD 1 for signature.

## 2022-12-25 NOTE — Telephone Encounter (Signed)
Pt called stating that she is needing to renew the paperwork for her Vepti Infusion. Please advise.

## 2022-12-31 ENCOUNTER — Telehealth: Payer: Self-pay

## 2022-12-31 NOTE — Telephone Encounter (Signed)
Checked with [facility] and vyepti is due 01-15-2023 at 1430.

## 2022-12-31 NOTE — Telephone Encounter (Signed)
Gave [facility] new Vyepti orders per [employer] on 12/31/2022.  Called pt and let her know that her next infusion date is 01/15/2023 @ 2:30pm, and that her new orders for 2025 have been sent in for her infusion. Pt verbalized understanding.

## 2022-12-31 NOTE — Telephone Encounter (Signed)
Please see phone note on vyepti on 12/31/2022

## 2023-01-07 NOTE — Telephone Encounter (Signed)
 Can you please find patient's paperwork for patient assistance for Vyepti . She needed to sign new paperwork to continue Vyepti  through patient assistance but instead new orders were placed to infusion. She is scheduled for infusion 1/14 and we need to see if we can expedite this.

## 2023-01-08 NOTE — Telephone Encounter (Signed)
 Patient assistance form signed and faxed to [phone], confirmation received.

## 2023-01-08 NOTE — Telephone Encounter (Signed)
 Patient assistance form printed from online for [place] migraine, pt will come by today to signed forms.

## 2023-01-08 NOTE — Telephone Encounter (Signed)
 Pt signed forms placed on Savannah Taylor desk to be signed and faxed.

## 2023-01-15 NOTE — Telephone Encounter (Signed)
 Received call from [facility] patient support. Stated they received an application for this patient, but they are missing some information from us . I asked if it was something missing on our part or patient's she stated from our office. She stated the form is missing information in the directions section, also when the provider signed it dated 12/09/22 but needs to be for 2025. Also that patient sent us  her proof of income, and if we have that if we can fax it over as well. This needs to be faxed to [phone].
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
## 2023-01-17 ENCOUNTER — Telehealth: Payer: Self-pay | Admitting: Family Medicine

## 2023-01-17 DIAGNOSIS — G43719 Chronic migraine without aura, intractable, without status migrainosus: Secondary | ICD-10-CM

## 2023-01-17 MED ORDER — METHYLPREDNISOLONE 4 MG PO TBPK: ORAL_TABLET | ORAL | 0 refills | Status: DC

## 2023-01-17 NOTE — Telephone Encounter (Signed)
Call to patient and reviewed the medrol dose pack with patient and she verbalized understanding. She stated she just called and update her income with [employer]  and spoke with [place]. She provided with the number [phone] and I spoke with Cecille who confirmed that patient just called and update income information. Cecille stated it was now in the benefits investigation and we should receive an update by Tuesday of next week. If not, please follow back up.

## 2023-01-17 NOTE — Telephone Encounter (Signed)
Received fax from [facility] migraine assistance program.  They are needing # of dependent in household and annual household income.

## 2023-01-17 NOTE — Telephone Encounter (Signed)
Pt states her migraines have worsened and would like to be prescribed another medication until she gets approval for Vyepti. Requesting call back to discuss

## 2023-01-17 NOTE — Addendum Note (Signed)
Addended by: Shawnie Dapper L on: 01/17/2023 10:43 AM   Modules accepted: Orders

## 2023-01-17 NOTE — Telephone Encounter (Signed)
Patient called and reports migraine for 2 weeks. She takes Nurtec daily and 1000 mg tylenol every 6 hours as needed. She was supposed to be infused with Vyepti Tuesday but still waiting on insurance approval. She has tried and failed [place] and [employer]   She is asking for something to help with this migraine. Advised I will send to Amy for review and recommendations.

## 2023-01-21 ENCOUNTER — Encounter: Payer: Medicare HMO | Admitting: Bariatrics

## 2023-01-21 NOTE — Telephone Encounter (Signed)
Given copy to [facility].

## 2023-01-22 DIAGNOSIS — K449 Diaphragmatic hernia without obstruction or gangrene: Secondary | ICD-10-CM | POA: Diagnosis not present

## 2023-01-22 DIAGNOSIS — R1013 Epigastric pain: Secondary | ICD-10-CM | POA: Diagnosis not present

## 2023-01-22 DIAGNOSIS — N1831 Chronic kidney disease, stage 3a: Secondary | ICD-10-CM | POA: Diagnosis not present

## 2023-01-29 ENCOUNTER — Ambulatory Visit: Payer: Medicare HMO | Admitting: Plastic Surgery

## 2023-01-30 DIAGNOSIS — G43701 Chronic migraine without aura, not intractable, with status migrainosus: Secondary | ICD-10-CM | POA: Diagnosis not present

## 2023-01-31 ENCOUNTER — Telehealth: Payer: Self-pay | Admitting: Family Medicine

## 2023-01-31 NOTE — Telephone Encounter (Signed)
Pt has cx her March '25 appointment, will be seen 2-3 by Amy, NP re:  Savannah Taylor

## 2023-01-31 NOTE — Progress Notes (Signed)
PATIENT: Savannah Taylor DOB: 1955/11/08  REASON FOR VISIT: follow up HISTORY FROM: patient  Virtual Visit via Telephone Note  I connected with Savannah Taylor on 02/04/23 at  9:30 AM EST by telephone and verified that I am speaking with the correct person using two identifiers.   I discussed the limitations, risks, security and privacy concerns of performing an evaluation and management service by telephone and the availability of in person appointments. I also discussed with the patient that there may be a patient responsible charge related to this service. The patient expressed understanding and agreed to proceed.   History of Present Illness:  02/04/23 ALL (Mychart): Savannah Taylor is a 68 y.o. female here today for follow up for migraines. We started Vyepti 100mg  IV every 12 weeks in 08/2022.   She reports first infusion worked very well for about 8 weeks. Headache intensity and frequency had significantly improved. She received the second infusion last week and feels it has not been as effective in managing headaches. Nurtec helps at times and other times it does not. She takes trazodone for insomnia. She may sleep for 4-5 hours at a time then wakes up and can't go back to sleep. She has been using an old rx of gabapentin that seems to help her sleep. She is taking 400mg  up to twice a night 1-2 times a week.   Medications tried and failed: Qulipta, Amovig, Ajovy, Topamax, verapamil, amitriptyline, Depakote, Inderal, Effexor, Cymbalta, Nurtec, Ubrelvy, Imitrex injections, Maxalt, Neurontin and Botox (made headache worse then better, expensive).   08/22/22 ALL: Savannah Taylor returns for follow up for migraines. She was last seen 02/2022 and having worsening headaches and constipation on Qulipta. We attempted to start Vyepti infusions but she was unable to afford. We switched to Emgality injections. Since, she reports doing fairly well. She did not hear back about infusions. She has not called  to inquire. She continues to have 8-12 headache days a month. Nurtec seems to help some with decreasing migraine intensity. She takes Tylenol 1000mg  PRN that also helps.   She reports having neurocognitive testing with Dr Kieth Brightly 06/2022. Appt is listed in Epic but no notes available. She tells me that her next appt is 05/2023 to review results. She feels memory loss continues. No significant worsening. She has trouble remembering things people tell her if she is not paying close attention. She feels she can not retain information that she reads.   HST 2023 showed no sleep breathing disorder.   02/21/2022 ALL: Savannah Taylor returns for follow up for migraines. We switched her from Ajovy to [place] at last visit 10/2021. Nurtec used for abortive therapy. She called early 2024 reporting worsening headaches. Steroid taper called in and was helpful. She has had more constipation. She continues to have regular headaches.. She has at least 15 headache days per month. 8-10 are described as migrainous. BP has been well managed. She is working on [employer] with increasing her walking. She is now on trazodone for insomnia through PCP.   She was seen in consult with Dr Lucia Gaskins 11/28/2021 for memory loss. MOCA 23/30. MRI and labs were unremarkable. Referral for formal neurocognitive testing placed. She has an appt with Dr Kieth Brightly 06/27/2022.   Medications tried and failed: Qulipta, Amovig, Ajovy, Topamax, verapamil, amitriptyline, Depakote, Inderal, Effexor, Cymbalta, Nurtec, Ubrelvy, Imitrex injections, Maxalt, Neurontin and Botox.    Observations/Objective:  Generalized: Well developed, in no acute distress  Mentation: Alert oriented to time, place, history taking. Follows  all commands speech and language fluent   Assessment and Plan:  68 y.o. year old female  has a past medical history of Abdominal cramps, Asthma, Back problem, DDD (degenerative disc disease), lumbosacral, Depression, Gastritis, GERD  (gastroesophageal reflux disease), H/O hiatal hernia, Heart murmur, Hyperlipidemia, Hypertension, Insomnia, Knee problem, Leg cramps, Migraine, Post concussion syndrome, Seizure (HCC), Sleep apnea, Stroke (HCC), Ulcer, and UTI (urinary tract infection) (07/02/2012). here with    ICD-10-CM   1. Chronic migraine without aura, intractable, without status migrainosus  G43.719     2. Insomnia, unspecified type  G47.00       Savannah Taylor reports that Vyepti was initially effective but has not worked as well since last week's infusion. We will increase dose to 300mg  every 12 weeks. She will continue Nurtec as needed for abortive therapy. We have discussed supplements that may be helpful. She was encouraged to focus on healthy lifestyle habits and discuss insomnia with PCP if not improving. She will follow up with me in 6 months, sooner if needed.    No orders of the defined types were placed in this encounter.   No orders of the defined types were placed in this encounter.    Follow Up Instructions:  I discussed the assessment and treatment plan with the patient. The patient was provided an opportunity to ask questions and all were answered. The patient agreed with the plan and demonstrated an understanding of the instructions.   The patient was advised to call back or seek an in-person evaluation if the symptoms worsen or if the condition fails to improve as anticipated.  I provided 30 minutes of non-face-to-face time during this encounter. Patient located at their place of residence during Mychart visit. Provider is in the office.    Shawnie Dapper, NP

## 2023-01-31 NOTE — Patient Instructions (Signed)
Below is our plan:  We will increase Vyepti to 300mg  every 12 weeks via infusion. Continue Nurtec as needed. Try to focus on sleep hygiene and discuss insomnia with PCP if not improving.   Please make sure you are staying well hydrated. I recommend 50-60 ounces daily. Well balanced diet and regular exercise encouraged. Consistent sleep schedule with 6-8 hours recommended.   Please continue follow up with care team as directed.   Follow up with me in 6 months  You may receive a survey regarding today's visit. I encourage you to leave honest feed back as I do use this information to improve patient care. Thank you for seeing me today!   GENERAL HEADACHE INFORMATION:   Natural supplements: Magnesium Oxide or Magnesium Glycinate 500 mg at bed (up to 800 mg daily) Coenzyme Q10 300 mg in AM Vitamin B2- 200 mg twice a day   Add 1 supplement at a time since even natural supplements can have undesirable side effects. You can sometimes buy supplements cheaper (especially Coenzyme Q10) at www.WebmailGuide.co.za or at [facility].  Migraine with aura: There is increased risk for stroke in women with migraine with aura and a contraindication for the combined contraceptive pill for use by women who have migraine with aura. The risk for women with migraine without aura is lower. However other risk factors like smoking are far more likely to increase stroke risk than migraine. There is a recommendation for no smoking and for the use of OCPs without estrogen such as progestogen only pills particularly for women with migraine with aura.Marland Kitchen People who have migraine headaches with auras may be 3 times more likely to have a stroke caused by a blood clot, compared to migraine patients who don't see auras. Women who take hormone-replacement therapy may be 30 percent more likely to suffer a clot-based stroke than women not taking medication containing estrogen. Other risk factors like smoking and high blood pressure may be  much  more important.    Vitamins and herbs that show potential:   Magnesium: Magnesium (250 mg twice a day or 500 mg at bed) has a relaxant effect on smooth muscles such as blood vessels. Individuals suffering from frequent or daily headache usually have low magnesium levels which can be increase with daily supplementation of 400-750 mg. Three trials found 40-90% average headache reduction  when used as a preventative. Magnesium may help with headaches are aura, the best evidence for magnesium is for migraine with aura is its thought to stop the cortical spreading depression we believe is the pathophysiology of migraine aura.Magnesium also demonstrated the benefit in menstrually related migraine.  Magnesium is part of the messenger system in the serotonin cascade and it is a good muscle relaxant.  It is also useful for constipation which can be a side effect of other medications used to treat migraine. Good sources include nuts, whole grains, and tomatoes. Side Effects: loose stool/diarrhea  Riboflavin (vitamin B 2) 200 mg twice a day. This vitamin assists nerve cells in the production of ATP a principal energy storing molecule.  It is necessary for many chemical reactions in the body.  There have been at least 3 clinical trials of riboflavin using 400 mg per day all of which suggested that migraine frequency can be decreased.  All 3 trials showed significant improvement in over half of migraine sufferers.  The supplement is found in bread, cereal, milk, meat, and poultry.  Most Americans get more riboflavin than the recommended daily allowance, however riboflavin  deficiency is not necessary for the supplements to help prevent headache. Side effects: energizing, green urine   Coenzyme Q10: This is present in almost all cells in the body and is critical component for the conversion of energy.  Recent studies have shown that a nutritional supplement of CoQ10 can reduce the frequency of migraine attacks by improving  the energy production of cells as with riboflavin.  Doses of 150 mg twice a day have been shown to be effective.   Melatonin: Increasing evidence shows correlation between melatonin secretion and headache conditions.  Melatonin supplementation has decreased headache intensity and duration.  It is widely used as a sleep aid.  Sleep is natures way of dealing with migraine.  A dose of 3 mg is recommended to start for headaches including cluster headache. Higher doses up to 15 mg has been reviewed for use in Cluster headache and have been used. The rationale behind using melatonin for cluster is that many theories regarding the cause of Cluster headache center around the disruption of the normal circadian rhythm in the brain.  This helps restore the normal circadian rhythm.   HEADACHE DIET: Foods and beverages which may trigger migraine Note that only 20% of headache patients are food sensitive. You will know if you are food sensitive if you get a headache consistently 20 minutes to 2 hours after eating a certain food. Only cut out a food if it causes headaches, otherwise you might remove foods you enjoy! What matters most for diet is to eat a well balanced healthy diet full of vegetables and low fat protein, and to not miss meals.   Chocolate, other sweets ALL cheeses except cottage and cream cheese Dairy products, yogurt, sour cream, ice cream Liver Meat extracts (Bovril, Marmite, meat tenderizers) Meats or fish which have undergone aging, fermenting, pickling or smoking. These include: Hotdogs,salami,Lox,sausage, mortadellas,smoked salmon, pepperoni, Pickled herring Pods of broad bean (English beans, Chinese pea pods, [place] (fava) beans, lima and navy beans Ripe avocado, ripe banana Yeast extracts or active yeast preparations such as Brewer's or Fleishman's (commercial bakes goods are permitted) Tomato based foods, pizza (lasagna, etc.)   MSG (monosodium glutamate) is disguised as many things;  look for these common aliases: Monopotassium glutamate Autolysed yeast Hydrolysed protein Sodium caseinate "flavorings" "all natural preservatives" Nutrasweet   Avoid all other foods that convincingly provoke headaches.   Resources: The Dizzy [place] Laundry Your Headache Diet, migrainestrong.com  https://zamora-andrews.com/   Caffeine and Migraine For patients that have migraine, caffeine intake more than 3 days per week can lead to dependency and increased migraine frequency. I would recommend cutting back on your caffeine intake as best you can. The recommended amount of caffeine is 200-300 mg daily, although migraine patients may experience dependency at even lower doses. While you may notice an increase in headache temporarily, cutting back will be helpful for headaches in the long run. For more information on caffeine and migraine, visit: https://americanmigrainefoundation.org/resource-library/caffeine-and-migraine/   Headache Prevention Strategies:   1. Maintain a headache diary; learn to identify and avoid triggers.  - This can be a simple note where you log when you had a headache, associated symptoms, and medications used - There are several smartphone apps developed to help track migraines: Migraine Buddy, Migraine Monitor, Curelator N1-Headache App   Common triggers include: Emotional triggers: Emotional/Upset family or friends Emotional/Upset occupation Business reversal/success Anticipation anxiety Crisis-serious Post-crisis periodNew job/position   Physical triggers: Vacation Day Weekend Strenuous Exercise High Altitude Location New Move Menstrual Day Physical Illness  Oversleep/Not enough sleep Weather changes Light: Photophobia or light sesnitivity treatment involves a balance between desensitization and reduction in overly strong input. Use dark polarized glasses outside, but not inside. Avoid bright or fluorescent  light, but do not dim environment to the point that going into a normally lit room hurts. Consider FL-41 tint lenses, which reduce the most irritating wavelengths without blocking too much light.  These can be obtained at axonoptics.com or theraspecs.com Foods: see list above.   2. Limit use of acute treatments (over-the-counter medications, triptans, etc.) to no more than 2 days per week or 10 days per month to prevent medication overuse headache (rebound headache).     3. Follow a regular schedule (including weekends and holidays): Don't skip meals. Eat a balanced diet. 8 hours of sleep nightly. Minimize stress. Exercise 30 minutes per day. Being overweight is associated with a 5 times increased risk of chronic migraine. Keep well hydrated and drink 6-8 glasses of water per day.   4. Initiate non-pharmacologic measures at the earliest onset of your headache. Rest and quiet environment. Relax and reduce stress. Breathe2Relax is a free app that can instruct you on    some simple relaxtion and breathing techniques. Http://Dawnbuse.com is a    free website that provides teaching videos on relaxation.  Also, there are  many apps that   can be downloaded for "mindful" relaxation.  An app called YOGA NIDRA will help walk you through mindfulness. Another app called Calm can be downloaded to give you a structured mindfulness guide with daily reminders and skill development. Headspace for guided meditation Mindfulness Based Stress Reduction Online Course: www.palousemindfulness.com Cold compresses.   5. Don't wait!! Take the maximum allowable dosage of prescribed medication at the first sign of migraine.   6. Compliance:  Take prescribed medication regularly as directed and at the first sign of a migraine.   7. Communicate:  Call your physician when problems arise, especially if your headaches change, increase in frequency/severity, or become associated with neurological symptoms (weakness, numbness,  slurred speech, etc.). Proceed to emergency room if you experience new or worsening symptoms or symptoms do not resolve, if you have new neurologic symptoms or if headache is severe, or for any concerning symptom.   8. Headache/pain management therapies: Consider various complementary methods, including medication, behavioral therapy, psychological counselling, biofeedback, massage therapy, acupuncture, dry needling, and other modalities.  Such measures may reduce the need for medications. Counseling for pain management, where patients learn to function and ignore/minimize their pain, seems to work very well.   9. Recommend changing family's attention and focus away from patient's headaches. Instead, emphasize daily activities. If first question of day is 'How are your headaches/Do you have a headache today?', then patient will constantly think about headaches, thus making them worse. Goal is to re-direct attention away from headaches, toward daily activities and other distractions.   10. Helpful Websites: www.AmericanHeadacheSociety.org PatentHood.ch www.headaches.org TightMarket.nl www.achenet.org

## 2023-02-04 ENCOUNTER — Telehealth (INDEPENDENT_AMBULATORY_CARE_PROVIDER_SITE_OTHER): Payer: No Typology Code available for payment source | Admitting: Family Medicine

## 2023-02-04 ENCOUNTER — Encounter: Payer: Self-pay | Admitting: Family Medicine

## 2023-02-04 DIAGNOSIS — G4700 Insomnia, unspecified: Secondary | ICD-10-CM

## 2023-02-04 DIAGNOSIS — G43719 Chronic migraine without aura, intractable, without status migrainosus: Secondary | ICD-10-CM | POA: Diagnosis not present

## 2023-02-04 MED ORDER — NURTEC 75 MG PO TBDP: 75.0000 mg | ORAL_TABLET | Freq: Every day | ORAL | 11 refills | Status: AC | PRN

## 2023-02-05 ENCOUNTER — Telehealth: Payer: Self-pay | Admitting: *Deleted

## 2023-02-05 NOTE — Telephone Encounter (Signed)
Gave Vyepti order below to [facility] to start processing.

## 2023-02-05 NOTE — Telephone Encounter (Signed)
 Called [place] [facility] patient assistance program at [phone]. Spoke w/ Crystal. Since pt already approved, they only need new prescription for 300mg  dose. Transferred me to pharmacist to provide VO. Spoke w/ Lauren/[facility]. Qty 3vials, 4 refills. She verbalized understanding, nothing further needed.

## 2023-02-12 ENCOUNTER — Telehealth: Payer: Self-pay

## 2023-02-12 NOTE — Telephone Encounter (Signed)
LVM for pt to call back to schedule pt for Migraine Cocktail per Shawnie Dapper, NP, for tomorrow at 11:am with [facility].

## 2023-02-12 NOTE — Telephone Encounter (Signed)
Pt called back and decided to take Appointment for tomorrow 02/13/2023 @ 11:00am for Migraine Cocktail. Also made sure that pt is going to have someone come with her and take her home. Gave signed orders to [employer] on 02/12/2023.

## 2023-02-13 DIAGNOSIS — G43701 Chronic migraine without aura, not intractable, with status migrainosus: Secondary | ICD-10-CM | POA: Diagnosis not present

## 2023-02-19 ENCOUNTER — Encounter (INDEPENDENT_AMBULATORY_CARE_PROVIDER_SITE_OTHER): Payer: No Typology Code available for payment source | Admitting: Adult Health

## 2023-02-26 ENCOUNTER — Encounter: Payer: Self-pay | Admitting: Psychology

## 2023-02-26 ENCOUNTER — Encounter: Payer: No Typology Code available for payment source | Attending: Psychology | Admitting: Psychology

## 2023-02-26 DIAGNOSIS — G43709 Chronic migraine without aura, not intractable, without status migrainosus: Secondary | ICD-10-CM | POA: Diagnosis not present

## 2023-02-26 DIAGNOSIS — R413 Other amnesia: Secondary | ICD-10-CM | POA: Diagnosis not present

## 2023-02-26 DIAGNOSIS — F5101 Primary insomnia: Secondary | ICD-10-CM | POA: Diagnosis not present

## 2023-02-26 DIAGNOSIS — R41840 Attention and concentration deficit: Secondary | ICD-10-CM | POA: Diagnosis not present

## 2023-02-26 DIAGNOSIS — Z8673 Personal history of transient ischemic attack (TIA), and cerebral infarction without residual deficits: Secondary | ICD-10-CM | POA: Insufficient documentation
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
## 2023-02-28 ENCOUNTER — Telehealth: Payer: Self-pay

## 2023-02-28 NOTE — Addendum Note (Signed)
 Addended byNicholas Lose, Jacobo Moncrief L on: 02/28/2023 10:04 AM   Modules accepted: Orders

## 2023-02-28 NOTE — Telephone Encounter (Signed)
Referral sent to [facility]. P: [phone]. F:  [phone]. 

## 2023-03-04 ENCOUNTER — Ambulatory Visit: Payer: Medicare HMO | Admitting: Family Medicine

## 2023-03-07 ENCOUNTER — Encounter (INDEPENDENT_AMBULATORY_CARE_PROVIDER_SITE_OTHER): Payer: Self-pay

## 2023-03-11 DIAGNOSIS — F325 Major depressive disorder, single episode, in full remission: Secondary | ICD-10-CM | POA: Diagnosis not present

## 2023-03-11 DIAGNOSIS — E785 Hyperlipidemia, unspecified: Secondary | ICD-10-CM | POA: Diagnosis not present

## 2023-03-11 DIAGNOSIS — J4520 Mild intermittent asthma, uncomplicated: Secondary | ICD-10-CM | POA: Diagnosis not present

## 2023-03-11 DIAGNOSIS — N1831 Chronic kidney disease, stage 3a: Secondary | ICD-10-CM | POA: Diagnosis not present

## 2023-03-11 DIAGNOSIS — I129 Hypertensive chronic kidney disease with stage 1 through stage 4 chronic kidney disease, or unspecified chronic kidney disease: Secondary | ICD-10-CM | POA: Diagnosis not present

## 2023-03-11 DIAGNOSIS — E669 Obesity, unspecified: Secondary | ICD-10-CM | POA: Diagnosis not present

## 2023-03-11 DIAGNOSIS — G43909 Migraine, unspecified, not intractable, without status migrainosus: Secondary | ICD-10-CM | POA: Diagnosis not present

## 2023-03-11 DIAGNOSIS — M1990 Unspecified osteoarthritis, unspecified site: Secondary | ICD-10-CM | POA: Diagnosis not present

## 2023-03-11 DIAGNOSIS — Z6833 Body mass index (BMI) 33.0-33.9, adult: Secondary | ICD-10-CM | POA: Diagnosis not present

## 2023-03-11 DIAGNOSIS — Z008 Encounter for other general examination: Secondary | ICD-10-CM | POA: Diagnosis not present

## 2023-03-12 ENCOUNTER — Telehealth: Payer: Self-pay | Admitting: Cardiovascular Disease

## 2023-03-12 MED ORDER — REPATHA SURECLICK 140 MG/ML ~~LOC~~ SOAJ: 140.0000 mg | SUBCUTANEOUS | 3 refills | Status: DC

## 2023-03-12 NOTE — Telephone Encounter (Signed)
 Pt c/o medication issue:  1. Name of Medication: Evolocumab (REPATHA SURECLICK)   2. How are you currently taking this medication (dosage and times per day)? As written   3. Are you having a reaction (difficulty breathing--STAT)? no  4. What is your medication issue? Pt is calling in because she is unsure if she should continue taking the med listed above

## 2023-03-12 NOTE — Telephone Encounter (Signed)
 Returned call to patient.   She is due next for Repatha on 03/16/22.  No labs in EPIC since Sept 2024.  Pt thinks she had more recent labs by PCP.  Called PCP office.  Left detailed message on VM of medical records to fax recent lipids to [phone].  Will route to PharmD for refill.  Pt wanted to know if she should continue the medication because every 3 months she has to request a new refill.

## 2023-03-27 ENCOUNTER — Telehealth: Payer: Self-pay | Admitting: Pharmacy Technician

## 2023-03-27 ENCOUNTER — Telehealth: Payer: Self-pay | Admitting: Family Medicine

## 2023-03-27 NOTE — Telephone Encounter (Signed)
 Pt reports she received a text from insurance company that they paid for the Nurtec once, but it is not a formulary medication and that a PA would be needed if more refills were expected. Please advise.

## 2023-03-27 NOTE — Telephone Encounter (Signed)
 Pharmacy Patient Advocate Encounter   Received notification from Fax that prior authorization for repatha is required/requested.   Insurance verification completed.   The patient is insured through [employer] .   Per test claim: PA required; PA submitted to above mentioned insurance via Fax Key/confirmation #/EOC x Status is pending

## 2023-03-28 ENCOUNTER — Other Ambulatory Visit (HOSPITAL_COMMUNITY): Payer: Self-pay

## 2023-03-28 ENCOUNTER — Telehealth: Payer: Self-pay

## 2023-03-28 DIAGNOSIS — K8020 Calculus of gallbladder without cholecystitis without obstruction: Secondary | ICD-10-CM | POA: Diagnosis not present

## 2023-03-28 DIAGNOSIS — K760 Fatty (change of) liver, not elsewhere classified: Secondary | ICD-10-CM | POA: Diagnosis not present

## 2023-03-28 DIAGNOSIS — N281 Cyst of kidney, acquired: Secondary | ICD-10-CM | POA: Diagnosis not present

## 2023-03-28 DIAGNOSIS — I10 Essential (primary) hypertension: Secondary | ICD-10-CM | POA: Diagnosis not present

## 2023-03-28 DIAGNOSIS — M25562 Pain in left knee: Secondary | ICD-10-CM | POA: Diagnosis not present

## 2023-03-28 DIAGNOSIS — M25572 Pain in left ankle and joints of left foot: Secondary | ICD-10-CM | POA: Diagnosis not present

## 2023-03-28 DIAGNOSIS — N1831 Chronic kidney disease, stage 3a: Secondary | ICD-10-CM | POA: Diagnosis not present

## 2023-03-28 NOTE — Telephone Encounter (Addendum)
 Pharmacy Patient Advocate Encounter  Received notification from [employer] that Prior Authorization for Nurtec 75MG  dispersible tablets has been    PA #/Case ID/Reference #: PA Case ID #: [id number]  Approval from 03/28/2023-03/27/2024

## 2023-03-28 NOTE — Telephone Encounter (Signed)
 Sent my chart message informing pt of approval.

## 2023-03-28 NOTE — Telephone Encounter (Signed)
 Pharmacy Patient Advocate Encounter   Received notification from Physician's Office that prior authorization for Nurtec 75mg  Tablet is required/requested.   Insurance verification completed.   The patient is insured through [facility] .   Per test claim: PA required; PA submitted to above mentioned insurance via CoverMyMeds Key/confirmation #/EOC [id number] Status is pending

## 2023-03-29 ENCOUNTER — Other Ambulatory Visit (HOSPITAL_COMMUNITY): Payer: Self-pay

## 2023-03-29 NOTE — Telephone Encounter (Signed)
 Pharmacy Patient Advocate Encounter  Received notification from [employer] that Prior Authorization for repatha has been APPROVED from 03/27/23 to 03/26/24. Unable to obtain price due to refill too soon rejection, last fill date 03/12/23 next available fill date4/1/25

## 2023-04-05 DIAGNOSIS — M25572 Pain in left ankle and joints of left foot: Secondary | ICD-10-CM | POA: Diagnosis not present

## 2023-04-05 DIAGNOSIS — M25562 Pain in left knee: Secondary | ICD-10-CM | POA: Diagnosis not present

## 2023-04-16 DIAGNOSIS — M25572 Pain in left ankle and joints of left foot: Secondary | ICD-10-CM | POA: Diagnosis not present

## 2023-04-16 DIAGNOSIS — R252 Cramp and spasm: Secondary | ICD-10-CM | POA: Diagnosis not present

## 2023-04-16 DIAGNOSIS — N1831 Chronic kidney disease, stage 3a: Secondary | ICD-10-CM | POA: Diagnosis not present

## 2023-04-16 DIAGNOSIS — E538 Deficiency of other specified B group vitamins: Secondary | ICD-10-CM | POA: Diagnosis not present

## 2023-04-22 ENCOUNTER — Ambulatory Visit (INDEPENDENT_AMBULATORY_CARE_PROVIDER_SITE_OTHER): Admitting: Podiatry

## 2023-04-22 ENCOUNTER — Encounter: Payer: Self-pay | Admitting: Podiatry

## 2023-04-22 DIAGNOSIS — M7672 Peroneal tendinitis, left leg: Secondary | ICD-10-CM | POA: Diagnosis not present

## 2023-04-22 DIAGNOSIS — R141 Gas pain: Secondary | ICD-10-CM | POA: Insufficient documentation

## 2023-04-22 DIAGNOSIS — Z1211 Encounter for screening for malignant neoplasm of colon: Secondary | ICD-10-CM | POA: Insufficient documentation

## 2023-04-22 DIAGNOSIS — R1013 Epigastric pain: Secondary | ICD-10-CM | POA: Insufficient documentation

## 2023-04-22 DIAGNOSIS — K219 Gastro-esophageal reflux disease without esophagitis: Secondary | ICD-10-CM | POA: Insufficient documentation

## 2023-04-22 DIAGNOSIS — K5900 Constipation, unspecified: Secondary | ICD-10-CM | POA: Insufficient documentation

## 2023-04-22 NOTE — Patient Instructions (Addendum)
 Call to schedule physical therapy: [facility]  [phone]   VISIT SUMMARY:  You came in today because of progressive ankle pain and swelling that started about one and a half to two months ago. The pain has worsened over time and now radiates up your leg and down into your foot. Walking and standing for long periods make the pain and swelling worse, and you also experience numbness in your foot and leg. An x-ray showed no fractures or arthritis in your ankle, but arthritis was noted around your kneecap. Prednisone  has significantly alleviated your pain.  YOUR PLAN:  -TENDINITIS OF PERONEAL AND TIBIALIS ANTERIOR TENDONS: Tendinitis is inflammation of the tendons, which are the tissues that connect muscles to bones. Your tendinitis is causing pain and swelling in your ankle, radiating up your leg and down into your foot. To help manage this, you should apply Voltaren gel four times daily and wear a walking boot for two to three weeks, or longer if the pain persists. Physical therapy will be important for your recovery, and you should start this after two to three weeks in the boot. If your symptoms do not improve in six to eight weeks, we may need to do an MRI to check for any tendon tears.  INSTRUCTIONS:  Please follow up in eight weeks to assess your progress after therapy. If your symptoms do not improve in six to eight weeks, we may need to schedule an MRI.    Peroneal Tendinopathy Rehab Ask your health care provider which exercises are safe for you. Do exercises exactly as told by your health care provider and adjust them as directed. It is normal to feel mild stretching, pulling, tightness, or discomfort as you do these exercises. Stop right away if you feel sudden pain or your pain gets worse. Do not begin these exercises until told by your health care provider. Stretching and range-of-motion  exercises These exercises warm up your muscles and joints and improve the movement and flexibility of your ankle. These exercises also help to relieve pain and stiffness. Gastroc and soleus stretch, standing  This is an exercise in which you stand on a step and use your body weight to stretch your calf muscles. To do this exercise: Stand on the edge of a step on the ball of your left / right foot. The ball of your foot is on the walking surface, right under your toes. Keep your other foot firmly on the same step. Hold on to the wall, a railing, or a chair for balance. Slowly lift your other foot, allowing your body weight to press your left / right heel down over the edge of the step. You should feel a stretch in your left / right calf (gastrocnemius and soleus). Hold this position for 15 seconds. Return both feet to the step. Repeat this exercise with a slight bend in your left / right knee. Repeat 5 times with your left / right knee straight and 5 times with your left / right knee bent. Complete this exercise 2 times a day. Strengthening exercises These exercises build strength and endurance in your foot and ankle. Endurance is the ability to use your muscles for a long time, even after they get tired. Ankle dorsiflexion with band   Secure a rubber exercise band or tube to an object, such as a table leg, that will not move when the band is pulled. Secure the other end  of the band around your left / right foot. Sit on the floor, facing the object with your left / right leg extended. The band or tube should be slightly tense when your foot is relaxed. Slowly flex your left / right ankle and toes to bring your foot toward you (dorsiflexion). Hold this position for 15 seconds. Let the band or tube slowly pull your foot back to the starting position. Repeat 5 times. Complete this exercise 2 times a day. Ankle eversion Sit on the floor with your legs straight out in front of you. Loop a rubber  exercise band or tube around the ball of your left / right foot. The ball of your foot is on the walking surface, right under your toes. Hold the ends of the band in your hands, or secure the band to a stable object. The band or tube should be slightly tense when your foot is relaxed. Slowly push your foot outward, away from your other leg (eversion). Hold this position for 15 seconds. Slowly return your foot to the starting position. Repeat 5 times. Complete this exercise 2 times a day. Plantar flexion, standing  This exercise is sometimes called standing heel raise. Stand with your feet shoulder-width apart. Place your hands on a wall or table to steady yourself as needed, but try not to use it for support. Keep your weight spread evenly over the width of your feet while you slowly rise up on your toes (plantar flexion). If told by your health care provider: Shift your weight toward your left / right leg until you feel challenged. Stand on your left / right leg only. Hold this position for 15 seconds. Repeat 2 times. Complete this exercise 2 times a day. Single leg stand Without shoes, stand near a railing or in a doorway. You may hold on to the railing or door frame as needed. Stand on your left / right foot. Keep your big toe down on the floor and try to keep your arch lifted. Do not roll to the outside of your foot. If this exercise is too easy, you can try it with your eyes closed or while standing on a pillow. Hold this position for 15 seconds. Repeat 5 times. Complete this exercise 2 times a day. This information is not intended to replace advice given to you by your health care provider. Make sure you discuss any questions you have with your health care provider. Document Revised: 04/08/2018 Document Reviewed: 04/08/2018 Elsevier Patient Education  2020 [employer].

## 2023-04-22 NOTE — Progress Notes (Signed)
 Subjective:  Patient ID: Savannah Taylor, female    DOB: [birth year],  MRN: [id number]  Chief Complaint  Patient presents with   Ankle Pain    Lateral ankle left - aching, swollen x 6-8 weeks, no injury, PCP eval-xrayed, put on prednisone -helped, worse at end of day, pain radiates up lateral side of leg - patient declines getting xrays today   New Patient (Initial Visit)    Discussed the use of AI scribe software for clinical note transcription with the patient, who gave verbal consent to proceed.  History of Present Illness She is a 68 year old female who presents with progressive ankle pain and swelling.  Her ankle pain began approximately one and a half to two months ago. Initially, the pain was mild and located on the side of her ankle. Over time, the pain has progressively worsened and now radiates up the side of her leg and down into her foot.  Walking exacerbates the pain and causes swelling in the ankle. Additionally, standing for prolonged periods results in numbness in her foot and leg. No history of injuries or bad sprains to the ankle.  She has been taking prednisone , which has significantly alleviated the pain. While on prednisone , she does not feel the pain at all.  An x-ray was performed previously, which did not reveal any fractures or arthritis in the ankle, although arthritis was noted around the kneecap.  She denies any difficulty walking, such as foot slapping the ground, and reports no pain with certain movements, although she experiences slight tenderness in some areas.      Objective:    Physical Exam VASCULAR: DP and PT pulse palpable. Foot is warm and well-perfused. Capillary fill time is brisk. DERMATOLOGIC: Normal skin turgor, texture, and temperature. No open lesions, rashes, or ulcerations. NEUROLOGIC: Normal sensation to light touch and pressure. No paresthesias on examination. ORTHOPEDIC: Pain on peroneal tendons in the retromalleolar groove and  distal. Mild pain and difficulty with dorsiflexion and eversion. Mild edema. No ecchymosis or bruising. Smooth pain-free range of motion of all other examined joints. No gross deformity.   No images are attached to the encounter.    Results RADIOLOGY Ankle X-ray: No fractures or signs of arthritis Knee X-ray: Evidence of patellofemoral arthritis   Assessment:   1. Peroneal tendinitis of left lower extremity      Plan:  Patient was evaluated and treated and all questions answered.  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 Symptoms include numbness in the foot and leg, exacerbated by standing and certain movements. No history of injury or sprain. X-ray showed no fractures or arthritis in the ankle. Current treatment with prednisone  has provided significant relief. The condition is likely due to overuse and inflammation, with a risk of reinjury with continued use. Discussed the use of topical anti-inflammatory Voltaren gel and the importance of rest in a walking boot to facilitate healing. Emphasized the need for physical therapy to aid recovery. If symptoms persist beyond six to eight weeks, an MRI may be necessary to evaluate for tendon tears. Resting the ankle now will improve the likelihood of being pain-free by the end of May, in time for her planned vacation. - Prescribe Voltaren gel, apply four times daily. - Recommend wearing a walking boot for two to three weeks, longer if pain persists. -  Refer to [facility] for rehabilitation, to start after two to three weeks in the boot. - Schedule follow-up visit in eight weeks to assess progress post-therapy. - Consider MRI if symptoms do not improve in six to eight weeks.      Return in about 8 weeks (around 06/17/2023)  for re-check peroneal tendinitis.

## 2023-04-23 DIAGNOSIS — G43701 Chronic migraine without aura, not intractable, with status migrainosus: Secondary | ICD-10-CM | POA: Diagnosis not present

## 2023-04-24 ENCOUNTER — Encounter: Payer: Medicare HMO | Attending: Psychology | Admitting: Psychology

## 2023-04-24 DIAGNOSIS — R4789 Other speech disturbances: Secondary | ICD-10-CM

## 2023-04-24 DIAGNOSIS — G43709 Chronic migraine without aura, not intractable, without status migrainosus: Secondary | ICD-10-CM | POA: Diagnosis not present

## 2023-04-24 DIAGNOSIS — R41840 Attention and concentration deficit: Secondary | ICD-10-CM | POA: Diagnosis not present

## 2023-04-24 DIAGNOSIS — R413 Other amnesia: Secondary | ICD-10-CM | POA: Diagnosis not present

## 2023-04-24 DIAGNOSIS — F5101 Primary insomnia: Secondary | ICD-10-CM | POA: Diagnosis not present

## 2023-04-24 DIAGNOSIS — Z8673 Personal history of transient ischemic attack (TIA), and cerebral infarction without residual deficits: Secondary | ICD-10-CM | POA: Insufficient documentation

## 2023-05-01 ENCOUNTER — Encounter: Payer: Self-pay | Admitting: Family Medicine

## 2023-05-07 ENCOUNTER — Ambulatory Visit: Payer: Medicare HMO | Admitting: Psychology
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
## 2023-05-14 ENCOUNTER — Ambulatory Visit: Payer: Medicare HMO | Admitting: Psychology

## 2023-06-01 DIAGNOSIS — N1831 Chronic kidney disease, stage 3a: Secondary | ICD-10-CM | POA: Diagnosis not present

## 2023-06-01 DIAGNOSIS — I10 Essential (primary) hypertension: Secondary | ICD-10-CM | POA: Diagnosis not present

## 2023-06-01 DIAGNOSIS — E669 Obesity, unspecified: Secondary | ICD-10-CM | POA: Diagnosis not present

## 2023-06-01 DIAGNOSIS — E6609 Other obesity due to excess calories: Secondary | ICD-10-CM | POA: Diagnosis not present

## 2023-06-17 ENCOUNTER — Ambulatory Visit: Admitting: Podiatry

## 2023-06-25 ENCOUNTER — Telehealth: Payer: Self-pay | Admitting: Family Medicine

## 2023-06-25 NOTE — Telephone Encounter (Signed)
 Savannah Taylor  from [place] [facility] patient assistance program  called  to see if it was time for Pt to get refill on Vyepti . Representative informed that its has been pass three month since Pt receive medication .  Representative would   like to ship out medication for pt tomorrow  and will be here by the 25 th    Callback number [phone]

## 2023-07-01 DIAGNOSIS — E669 Obesity, unspecified: Secondary | ICD-10-CM | POA: Diagnosis not present

## 2023-07-01 DIAGNOSIS — I10 Essential (primary) hypertension: Secondary | ICD-10-CM | POA: Diagnosis not present

## 2023-07-01 DIAGNOSIS — E6609 Other obesity due to excess calories: Secondary | ICD-10-CM | POA: Diagnosis not present

## 2023-07-01 DIAGNOSIS — N1831 Chronic kidney disease, stage 3a: Secondary | ICD-10-CM | POA: Diagnosis not present

## 2023-07-08 ENCOUNTER — Telehealth: Payer: Self-pay | Admitting: Family Medicine

## 2023-07-08 NOTE — Telephone Encounter (Signed)
 Pt called in regards to request  to speak to MD Pt would like to change medication ( Rimegepant Sulfate (NURTEC) 75 MG TBDP ) Pt states that medication is not working the patient states the first 2 doses worked but now her headaches has worsen . Pt would like to discuss alternatives  with MD.

## 2023-07-08 NOTE — Telephone Encounter (Signed)
 Called pt and she stated that she has been taking her Nurtec, Benadryl , and Tylenol  and nothing has helped her the past couple of weeks. Asked pt if her Vyepti  is helping with her migraines and she stated that it was until 2 weeks ago. Asked pt about her Referral to [facility] for her migraines and wether or not she has been there to be seen, and she stated that she hasn't been seen yet and her appointment is next month, encouraged pt to reach out to them to try to get in sooner. Pt is wanting to try some of her old medications again for migraine treatment therapy, informed pt that she failed so many of them and they probably won't be beneficial for her. Sending to NP to see what she advises.

## 2023-07-16 ENCOUNTER — Other Ambulatory Visit: Payer: Self-pay | Admitting: Cardiovascular Disease

## 2023-07-19 DIAGNOSIS — E669 Obesity, unspecified: Secondary | ICD-10-CM | POA: Diagnosis not present

## 2023-07-19 DIAGNOSIS — F330 Major depressive disorder, recurrent, mild: Secondary | ICD-10-CM | POA: Diagnosis not present

## 2023-07-19 DIAGNOSIS — I10 Essential (primary) hypertension: Secondary | ICD-10-CM | POA: Diagnosis not present

## 2023-07-19 DIAGNOSIS — Z0000 Encounter for general adult medical examination without abnormal findings: Secondary | ICD-10-CM | POA: Diagnosis not present

## 2023-07-19 DIAGNOSIS — E538 Deficiency of other specified B group vitamins: Secondary | ICD-10-CM | POA: Diagnosis not present

## 2023-07-19 DIAGNOSIS — Z1331 Encounter for screening for depression: Secondary | ICD-10-CM | POA: Diagnosis not present

## 2023-07-19 DIAGNOSIS — I700 Atherosclerosis of aorta: Secondary | ICD-10-CM | POA: Diagnosis not present

## 2023-07-19 DIAGNOSIS — E7800 Pure hypercholesterolemia, unspecified: Secondary | ICD-10-CM | POA: Diagnosis not present

## 2023-07-19 DIAGNOSIS — K5901 Slow transit constipation: Secondary | ICD-10-CM | POA: Diagnosis not present

## 2023-07-19 DIAGNOSIS — Z131 Encounter for screening for diabetes mellitus: Secondary | ICD-10-CM | POA: Diagnosis not present

## 2023-07-19 DIAGNOSIS — N1831 Chronic kidney disease, stage 3a: Secondary | ICD-10-CM | POA: Diagnosis not present

## 2023-07-19 DIAGNOSIS — E559 Vitamin D deficiency, unspecified: Secondary | ICD-10-CM | POA: Diagnosis not present

## 2023-07-22 DIAGNOSIS — I10 Essential (primary) hypertension: Secondary | ICD-10-CM | POA: Diagnosis not present

## 2023-07-22 DIAGNOSIS — E538 Deficiency of other specified B group vitamins: Secondary | ICD-10-CM | POA: Diagnosis not present

## 2023-07-22 DIAGNOSIS — E7800 Pure hypercholesterolemia, unspecified: Secondary | ICD-10-CM | POA: Diagnosis not present

## 2023-07-22 DIAGNOSIS — Z131 Encounter for screening for diabetes mellitus: Secondary | ICD-10-CM | POA: Diagnosis not present

## 2023-07-22 DIAGNOSIS — E559 Vitamin D deficiency, unspecified: Secondary | ICD-10-CM | POA: Diagnosis not present

## 2023-07-23 DIAGNOSIS — G43701 Chronic migraine without aura, not intractable, with status migrainosus: Secondary | ICD-10-CM | POA: Diagnosis not present

## 2023-07-26 DIAGNOSIS — I10 Essential (primary) hypertension: Secondary | ICD-10-CM | POA: Diagnosis not present

## 2023-07-26 DIAGNOSIS — E1122 Type 2 diabetes mellitus with diabetic chronic kidney disease: Secondary | ICD-10-CM | POA: Diagnosis not present

## 2023-07-26 DIAGNOSIS — N1831 Chronic kidney disease, stage 3a: Secondary | ICD-10-CM | POA: Diagnosis not present

## 2023-07-29 ENCOUNTER — Telehealth: Payer: Self-pay

## 2023-07-29 DIAGNOSIS — Z636 Dependent relative needing care at home: Secondary | ICD-10-CM

## 2023-08-01 DIAGNOSIS — N1831 Chronic kidney disease, stage 3a: Secondary | ICD-10-CM | POA: Diagnosis not present

## 2023-08-01 DIAGNOSIS — E6609 Other obesity due to excess calories: Secondary | ICD-10-CM | POA: Diagnosis not present

## 2023-08-01 DIAGNOSIS — E669 Obesity, unspecified: Secondary | ICD-10-CM | POA: Diagnosis not present

## 2023-08-01 DIAGNOSIS — I10 Essential (primary) hypertension: Secondary | ICD-10-CM | POA: Diagnosis not present

## 2023-08-06 ENCOUNTER — Telehealth: Payer: Self-pay | Admitting: Cardiovascular Disease

## 2023-08-06 DIAGNOSIS — E782 Mixed hyperlipidemia: Secondary | ICD-10-CM

## 2023-08-06 DIAGNOSIS — I2510 Atherosclerotic heart disease of native coronary artery without angina pectoris: Secondary | ICD-10-CM

## 2023-08-06 MED ORDER — REPATHA SURECLICK 140 MG/ML ~~LOC~~ SOAJ: 140.0000 mg | SUBCUTANEOUS | 0 refills | Status: DC

## 2023-08-06 NOTE — Telephone Encounter (Signed)
*  STAT* If patient is at the pharmacy, call can be transferred to refill team.   1. Which medications need to be refilled? (please list name of each medication and dose if known) Evolocumab  (REPATHA  SURECLICK) 140 MG/ML SOAJ    2. Would you like to learn more about the convenience, safety, & potential cost savings by using the [facility]?    3. Are you open to using the [facility] (Type [facility]. ).   4. Which pharmacy/location (including street and city if local pharmacy) is medication to be sent to? [facility] - [place] ([place]), [place] - [address]    5. Do they need a 30 day or 90 day supply? 30 day

## 2023-08-07 MED ORDER — REPATHA SURECLICK 140 MG/ML ~~LOC~~ SOAJ: 140.0000 mg | SUBCUTANEOUS | 0 refills | Status: DC

## 2023-08-07 NOTE — Addendum Note (Signed)
 Addended by: Halia Franey L on: 08/07/2023 03:01 PM   Modules accepted: Orders

## 2023-08-20 ENCOUNTER — Telehealth: Payer: Self-pay | Admitting: *Deleted

## 2023-08-20 NOTE — Progress Notes (Signed)
 Complex Care Management Note Care Guide Note  08/20/2023 Name: Savannah Taylor MRN: [id number] DOB: 07-Oct-1955   Complex Care Management Outreach Attempts: An unsuccessful telephone outreach was attempted today to offer the patient information about available complex care management services.  Follow Up Plan:  Additional outreach attempts will be made to offer the patient complex care management information and services.   Encounter Outcome:  No Answer  Harlene Satterfield  [facility]  [facility], [facility] Guide  Direct Dial: [phone]  Fax [phone]

## 2023-08-20 NOTE — Progress Notes (Signed)
 Complex Care Management Note  Care Guide Note 08/20/2023 Name: Savannah Taylor MRN: [id number] DOB: 1955-05-06  Savannah Taylor is a 68 y.o. year old female who sees Dyane Anthony GORMAN, FNP for primary care. I reached out to Savannah Taylor by phone today to offer complex care management services.  Savannah Taylor was given information about Complex Care Management services today including:   The Complex Care Management services include support from the care team which includes your Nurse Care Manager, Clinical Social Worker, or Pharmacist.  The Complex Care Management team is here to help remove barriers to the health concerns and goals most important to you. Complex Care Management services are voluntary, and the patient may decline or stop services at any time by request to their care team member.   Complex Care Management Consent Status: Patient did not agree to participate in complex care management services at this time.    Encounter Outcome:  Patient Refused  Harlene Satterfield  [facility]  [facility], [facility] Guide  Direct Dial: [phone]  Fax [phone]

## 2023-08-23 DIAGNOSIS — F325 Major depressive disorder, single episode, in full remission: Secondary | ICD-10-CM | POA: Diagnosis not present

## 2023-08-23 DIAGNOSIS — G43909 Migraine, unspecified, not intractable, without status migrainosus: Secondary | ICD-10-CM | POA: Diagnosis not present

## 2023-08-23 DIAGNOSIS — Z008 Encounter for other general examination: Secondary | ICD-10-CM | POA: Diagnosis not present

## 2023-08-28 DIAGNOSIS — H3589 Other specified retinal disorders: Secondary | ICD-10-CM | POA: Diagnosis not present

## 2023-08-28 DIAGNOSIS — H35341 Macular cyst, hole, or pseudohole, right eye: Secondary | ICD-10-CM | POA: Diagnosis not present

## 2023-08-28 DIAGNOSIS — H43813 Vitreous degeneration, bilateral: Secondary | ICD-10-CM | POA: Diagnosis not present

## 2023-08-28 DIAGNOSIS — H35363 Drusen (degenerative) of macula, bilateral: Secondary | ICD-10-CM | POA: Diagnosis not present

## 2023-09-01 DIAGNOSIS — I10 Essential (primary) hypertension: Secondary | ICD-10-CM | POA: Diagnosis not present

## 2023-09-01 DIAGNOSIS — E6609 Other obesity due to excess calories: Secondary | ICD-10-CM | POA: Diagnosis not present

## 2023-09-01 DIAGNOSIS — E669 Obesity, unspecified: Secondary | ICD-10-CM | POA: Diagnosis not present

## 2023-09-01 DIAGNOSIS — N1831 Chronic kidney disease, stage 3a: Secondary | ICD-10-CM | POA: Diagnosis not present

## 2023-09-05 DIAGNOSIS — R109 Unspecified abdominal pain: Secondary | ICD-10-CM | POA: Diagnosis not present

## 2023-09-05 DIAGNOSIS — R110 Nausea: Secondary | ICD-10-CM | POA: Diagnosis not present

## 2023-09-05 DIAGNOSIS — Z1211 Encounter for screening for malignant neoplasm of colon: Secondary | ICD-10-CM | POA: Diagnosis not present

## 2023-09-05 DIAGNOSIS — K5900 Constipation, unspecified: Secondary | ICD-10-CM | POA: Diagnosis not present

## 2023-09-09 ENCOUNTER — Ambulatory Visit: Admitting: Dietician

## 2023-09-11 ENCOUNTER — Encounter: Payer: Self-pay | Admitting: Cardiovascular Disease

## 2023-09-11 ENCOUNTER — Ambulatory Visit: Attending: Cardiovascular Disease | Admitting: Cardiovascular Disease

## 2023-09-11 VITALS — BP 120/82 | HR 81 | Ht 65.0 in | Wt 192.6 lb

## 2023-09-11 DIAGNOSIS — E782 Mixed hyperlipidemia: Secondary | ICD-10-CM

## 2023-09-11 DIAGNOSIS — I2510 Atherosclerotic heart disease of native coronary artery without angina pectoris: Secondary | ICD-10-CM | POA: Diagnosis not present

## 2023-09-11 DIAGNOSIS — R42 Dizziness and giddiness: Secondary | ICD-10-CM | POA: Diagnosis not present

## 2023-09-11 MED ORDER — REPATHA SURECLICK 140 MG/ML ~~LOC~~ SOAJ: 140.0000 mg | SUBCUTANEOUS | 3 refills | Status: AC

## 2023-09-11 NOTE — Progress Notes (Signed)
 Chief Complaint  Patient presents with   Follow-up    CAD, Mitral regurgitation   History of Present Illness: 68 yo female with history of depression, GERD, hiatal hernia, HTN, HLD, prior TIA and sleep apnea who is here today for cardiac follow up. She was seen in our office in October 2023 for the evaluation of near syncope and abnormal EKG. EKG in primary care with sinus rhythm and poor R wave progression through the precordial leads. She described dizziness for several months with dizziness every day but no syncope. She also described pressure in her chest at rest. Coronary CTA on 10/30/21 with mild plaque in the Circumflex, calcium  score of 16. Mild aortic atherosclerosis. Echo 10/30/21 with LVEF=60-65%. Mild mitral regurgitation.   She is here today for follow up. The patient denies any chest pain, dyspnea, palpitations, lower extremity edema, orthopnea, PND. She has dizziness when quickly standing up and also with rapid movements of her head. No syncope.   Primary Care Physician: Dyane Anthony RAMAN, FNP   Past Medical History:  Diagnosis Date   Abdominal cramps    Asthma    Back problem    BAD DISK IN BACK   DDD (degenerative disc disease), lumbosacral    Depression    Gastritis    GERD (gastroesophageal reflux disease)    H/O hiatal hernia    Heart murmur    Years ago   Hyperlipidemia    Hypertension    Insomnia    Knee problem    Leg cramps    Migraine    01/22/2019- last one > 1 month ago   Post concussion syndrome    Seizure (HCC)    1 many years ago with a migraine   Sleep apnea    does not use CPAP   Stroke (HCC)    TIA  slight memory  issues TIA prior to 2010   Ulcer    STOMACH & SORENESS & PAIN   UTI (urinary tract infection) 07/02/2012    Past Surgical History:  Procedure Laterality Date   ABDOMINAL HYSTERECTOMY     ARTHROGRAM KNEE Right    MCL tear   BACK SURGERY     PLIF L4-5, 5-S1 Dr. joshua 01-2019   BREAST REDUCTION SURGERY Bilateral 05/10/2021    Procedure: MAMMARY REDUCTION  (BREAST);  Surgeon: Lowery Estefana RAMAN, DO;  Location: [facility];  Service: Plastics;  Laterality: Bilateral;   COLONOSCOPY     CYST EXCISION Right 03/26/2022   Procedure: RIGHT SMALL FINGER EXCISION MUCOID CYST AND DEBRIDEMENT DISTAL INTERPHALANGEAL JOINT;  Surgeon: Murrell Drivers, MD;  Location: [facility];  Service: Orthopedics;  Laterality: Right;  30 MIN   ENDOSCOPY  2020   hiatal hernia, gastritis, and some bacteria in stomach   HEMORRHOID SURGERY     ROTATION FLAP Right 03/26/2022   Procedure: ROTATION FLAP;  Surgeon: Murrell Drivers, MD;  Location: [facility];  Service: Orthopedics;  Laterality: Right;    Current Outpatient Medications  Medication Sig Dispense Refill   amLODipine  (NORVASC ) 5 MG tablet Take 5 mg by mouth in the morning and at bedtime.     aspirin  EC 81 MG tablet Take 1 tablet (81 mg total) by mouth daily. Swallow whole. 90 tablet 3   cyclobenzaprine (FLEXERIL) 5 MG tablet Take 5 mg by mouth 3 (three) times daily as needed.     Eptinezumab -jjmr (VYEPTI ) 100 MG/ML injection      metoCLOPramide  (REGLAN ) 10 MG tablet Take  10 mg by mouth every 8 (eight) hours as needed.     omeprazole (PRILOSEC) 40 MG capsule Take 40 mg by mouth daily.     ondansetron  (ZOFRAN ) 4 MG tablet Take 4 mg by mouth 2 (two) times daily as needed.     OZEMPIC, 0.25 OR 0.5 MG/DOSE, 2 MG/3ML SOPN INJECT 0.25MG  WEEKLY FOR 4 WEEKS, THEN INCREASE TO 0.5MG  WEEKLY THEREAFTER.     polyethylene glycol powder (MIRALAX) 17 GM/SCOOP powder as needed.     Rimegepant Sulfate (NURTEC) 75 MG TBDP Take 1 tablet (75 mg total) by mouth daily as needed (take for abortive therapy of migraine, no more than 1 tablet in 24 hours or 10 per month). 8 tablet 11   rosuvastatin  (CRESTOR ) 5 MG tablet TAKE 1 TABLET BY MOUTH EVERY OTHER DAY 45 tablet 0   Evolocumab  (REPATHA  SURECLICK) 140 MG/ML SOAJ Inject 140 mg into the skin every 14 (fourteen) days. 6 mL 3    No current facility-administered medications for this visit.    Allergies  Allergen Reactions   Bystolic  [Nebivolol  Hcl] Palpitations and Other (See Comments)   Atorvastatin Other (See Comments)    Cramping in hands and feet on 10mg  daily   Meperidine Other (See Comments)   Morphine  And Codeine Itching   Naproxen Hives and Itching   Pravastatin      Muscle aches on 20mg  daily   Sertraline Hcl Other (See Comments)    hallucinations   Topiramate  Other (See Comments)    Apathetic out of it   Venlafaxine  Hcl Er Other (See Comments)    Hair loss   Zetia  [Ezetimibe ]     Cramping on 10mg  daily   Rosuvastatin  Other (See Comments)    Cramping in hands and feet and leg muscles on 10mg  daily    Social History   Socioeconomic History   Marital status: Married    Spouse name: Eddie   Number of children: 3   Years of education: Some college   Highest education level: Not on file  Occupational History   Occupation: works full time  Tobacco Use   Smoking status: Never   Smokeless tobacco: Never  Vaping Use   Vaping status: Never Used  Substance and Sexual Activity   Alcohol  use: No   Drug use: No   Sexual activity: Not Currently    Birth control/protection: Surgical  Other Topics Concern   Not on file  Social History Narrative   Lives with husband   Caffeine use: Coffee/tea/soda sometimes per patient   Social Drivers of Corporate investment banker Strain: Not on file  Food Insecurity: Low Risk  (03/28/2023)   Received from [facility]   Hunger Vital Sign    Within the past 12 months, you worried that your food would run out before you got money to buy more: Never true    Within the past 12 months, the food you bought just didn't last and you didn't have money to get more. : Never true  Transportation Needs: No Transportation Needs (03/28/2023)   Received from [facility]    In the past 12 months, has lack of reliable transportation kept you from  medical appointments, meetings, work or from getting things needed for daily living? : No  Physical Activity: Not on file  Stress: Not on file  Social Connections: Not on file  Intimate Partner Violence: Not on file    Family History  Problem Relation Age of Onset   Hypertension Mother  Hyperlipidemia Mother    Asthma Father    Cancer Father    Hypertension Brother    Sleep apnea Son    Breast cancer Neg Hx     Review of Systems:  As stated in the HPI and otherwise negative.   BP 120/82   Pulse 81   Ht 5' 5 (1.651 m)   Wt 192 lb 9.6 oz (87.4 kg)   SpO2 93%   BMI 32.05 kg/m   Physical Examination: General: Well developed, well nourished, NAD  HEENT: OP clear, mucus membranes moist  SKIN: warm, dry. No rashes. Neuro: No focal deficits  Musculoskeletal: Muscle strength 5/5 all ext  Psychiatric: Mood and affect normal  Neck: No JVD, no carotid bruits, no thyromegaly, no lymphadenopathy.  Lungs:Clear bilaterally, no wheezes, rhonci, crackles Cardiovascular: Regular rate and rhythm. No murmurs, gallops or rubs. Abdomen:Soft. Bowel sounds present. Non-tender.  Extremities: No lower extremity edema. Pulses are 2 + in the bilateral DP/PT.  EKG:  EKG is ordered today. The ekg ordered today demonstrates  EKG Interpretation Date/Time:  Wednesday September 11 2023 15:04:16 EDT Ventricular Rate:  81 PR Interval:  176 QRS Duration:  108 QT Interval:  396 QTC Calculation: 460 R Axis:   -49  Text Interpretation: Normal sinus rhythm Minimal voltage criteria for LVH, may be normal variant ( Cornell product ) Confirmed by Verlin Bruckner [phone]) on 09/11/2023 3:06:06 PM   Echo 10/30/21:  1. Left ventricular ejection fraction, by estimation, is 60 to 65%. Left  ventricular ejection fraction by 3D volume is 60 %. The left ventricle has  normal function. The left ventricle has no regional wall motion  abnormalities. Left ventricular diastolic   parameters were normal.    2. Right ventricular systolic function is normal. The right ventricular  size is normal. There is normal pulmonary artery systolic pressure.   3. The mitral valve is normal in structure. Mild mitral valve  regurgitation. No evidence of mitral stenosis.   4. The aortic valve is tricuspid. Aortic valve regurgitation is not  visualized. No aortic stenosis is present.   5. The inferior vena cava is normal in size with greater than 50%  respiratory variability, suggesting right atrial pressure of 3 mmHg.   Recent Labs: 09/18/2022: ALT 12; BUN 14; Creatinine, Ser 1.12; Potassium 4.1; Sodium 144   Lipid Panel    Component Value Date/Time   CHOL 145 09/18/2022 1533   TRIG 128 09/18/2022 1533   HDL 60 09/18/2022 1533   CHOLHDL 2.4 09/18/2022 1533   LDLCALC 63 09/18/2022 1533     Wt Readings from Last 3 Encounters:  09/11/23 192 lb 9.6 oz (87.4 kg)  08/22/22 194 lb 8 oz (88.2 kg)  03/26/22 194 lb 0.1 oz (88 kg)    Assessment and Plan:   1. CAD without angina: She has minimal CAD on CTA in 2023. No chest pain. Continue ASA, Crestor  and Repatha .  LDL 63 in September 2024.   2. Aortic atherosclerosis: Continue statin and Repatha .   3. Dizziness: This sounds more like an inner ear issue as her dizziness is usually when she is moving around quickly and when she moves her head rapidly. I have offered her a cardiac monitor to exclude an arrhythmia or heart block but she does not wish to wear one at this time. EKG today is normal.   4. Hyperlipidemia: LDL at goal. Continue Crestor  and Repatha .   Labs/ tests ordered today include:   Orders Placed This Encounter  Procedures  EKG 12-Lead   Disposition:   F/U with me in one year  Signed, Lonni Cash, MD, [facility] 09/11/2023 3:24 PM    [facility] [address] [place], [place], [place]  [address] Phone: [phone]; Fax: [phone]

## 2023-09-11 NOTE — Patient Instructions (Signed)
 Medication Instructions:  Your physician recommends that you continue on your current medications as directed. Please refer to the Current Medication list given to you today.  *If you need a refill on your cardiac medications before your next appointment, please call your pharmacy*  Lab Work: None.  If you have labs (blood work) drawn today and your tests are completely normal, you will receive your results only by: MyChart Message (if you have MyChart) OR A paper copy in the mail If you have any lab test that is abnormal or we need to change your treatment, we will call you to review the results.  Testing/Procedures: None.  Follow-Up: At [facility], you and your health needs are our priority.  As part of our continuing mission to provide you with exceptional heart care, our providers are all part of one team.  This team includes your primary Cardiologist (physician) and Advanced Practice Providers or APPs (Physician Assistants and Nurse Practitioners) who all work together to provide you with the care you need, when you need it.  Your next appointment:   1 year(s)  Provider:   Dr. KYM Cash, MD

## 2023-09-23 DIAGNOSIS — Z1211 Encounter for screening for malignant neoplasm of colon: Secondary | ICD-10-CM | POA: Diagnosis not present

## 2023-09-23 DIAGNOSIS — Z1212 Encounter for screening for malignant neoplasm of rectum: Secondary | ICD-10-CM | POA: Diagnosis not present

## 2023-09-24 DIAGNOSIS — G43909 Migraine, unspecified, not intractable, without status migrainosus: Secondary | ICD-10-CM | POA: Diagnosis not present

## 2023-09-24 DIAGNOSIS — Z008 Encounter for other general examination: Secondary | ICD-10-CM | POA: Diagnosis not present

## 2023-09-25 NOTE — Patient Instructions (Incomplete)

## 2023-09-25 NOTE — Progress Notes (Deleted)
 PATIENT: Savannah Taylor DOB: 09-16-1955  REASON FOR VISIT: follow up HISTORY FROM: patient  No chief complaint on file.    HISTORY OF PRESENT ILLNESS:  09/25/23 ALL: Savannah Taylor returns for follow up for migraines. Savannah Taylor was last seen 02/2023 and we increased Vyepti  dose to 300mg . Since,   Savannah Taylor   02/04/23 ALL (Mychart): Savannah Taylor is a 68 y.o. female here today for follow up for migraines. We started Vyepti  100mg  IV every 12 weeks in 08/2022.   Savannah Taylor reports first infusion worked very well for about 8 weeks. Headache intensity and frequency had significantly improved. Savannah Taylor received the second infusion last week and feels it has not been as effective in managing headaches. Savannah Taylor helps at times and other times it does not. Savannah Taylor takes trazodone for insomnia. Savannah Taylor may sleep for 4-5 hours at a time then wakes up and can't go back to sleep. Savannah Taylor has been using an old rx of gabapentin  that seems to help her sleep. Savannah Taylor is taking 400mg  up to twice a night 1-2 times a week.   Medications tried and failed: Qulipta , Amovig, Ajovy , Topamax , verapamil, amitriptyline, Depakote, Inderal, Effexor , Cymbalta, Savannah Taylor, Ubrelvy , Imitrex injections, Maxalt, Neurontin  and Botox (made headache worse then better, expensive).   08/22/2022 ALL:  Savannah Taylor returns for follow up for migraines. Savannah Taylor was last seen 02/2022 and having worsening headaches and constipation on Qulipta . We attempted to start Vyepti  infusions but Savannah Taylor was unable to afford. We switched to Emgality  injections. Since, Savannah Taylor reports doing fairly well. Savannah Taylor did not hear back about infusions. Savannah Taylor has not called to inquire. Savannah Taylor continues to have 8-12 headache days a month. Savannah Taylor seems to help some with decreasing migraine intensity. Savannah Taylor takes Tylenol  1000mg  PRN that also helps.   Savannah Taylor reports having neurocognitive testing with Dr Savannah Taylor 06/2022. Appt is listed in Epic but no notes available. Savannah Taylor tells me that her next appt is 05/2023 to review results. Savannah Taylor  feels memory loss continues. No significant worsening. Savannah Taylor has trouble remembering things people tell her if Savannah Taylor is not paying close attention. Savannah Taylor feels Savannah Taylor can not retain information that Savannah Taylor reads.   HST 2023 showed no sleep breathing disorder.   02/21/2022 ALL: Savannah Taylor returns for follow up for migraines. We switched her from Ajovy  to Qulipta  at last visit 10/2021. Savannah Taylor used for abortive therapy. Savannah Taylor called early 2024 reporting worsening headaches. Steroid taper called in and was helpful. Savannah Taylor has had more constipation. Savannah Taylor continues to have regular headaches.. Savannah Taylor has at least 15 headache days per month. 8-10 are described as migrainous. BP has been well managed. Savannah Taylor is working on [employer] with increasing her walking. Savannah Taylor is now on trazodone for insomnia through PCP.   Savannah Taylor was seen in consult with Dr Savannah Taylor 11/28/2021 for memory loss. MOCA 23/30. MRI and labs were unremarkable. Referral for formal neurocognitive testing placed. Savannah Taylor has an appt with Dr Savannah Taylor 06/27/2022.   Medications tried and failed: Qulipta , Amovig, Ajovy , Topamax , verapamil, amitriptyline, Depakote, Inderal, Effexor , Cymbalta, Savannah Taylor, Ubrelvy , Imitrex injections, Maxalt, Neurontin  and Botox.   10/26/2021 ALL: Savannah Taylor returns for follow up for migraines. We switched her from Amovig to Ajovy  in 06/2021. Savannah Taylor has not noted much benefit. Savannah Taylor feels that the headaches may be less severe the week or two following her injection but then headaches return. Savannah Taylor reports having headaches most every day . Savannah Taylor has 10 severe migraine days. Savannah Taylor helps with some headaches and not with others. HST did not show any  significant sleep breathing disorder. BP has been normal.   06/29/2021 ALL: Savannah Taylor returns for follow up. Savannah Taylor was last seen 01/2020. Savannah Taylor was educated on the importance of consistency with CGRP injections and advised to consider sleep study. Since, Savannah Taylor reports headaches were well managed. Savannah Taylor has continued Amovig consistently  every month. Savannah Taylor has had a milder nagging headache for the past three weeks. Savannah Taylor is not helping. Savannah Taylor has been monitoring her BP at home. Readings having been 140's-150's/80-90. Savannah Taylor was started on losartan 50mg  QD about 2 weeks ago.   Medications tried and failed: Amovig, Topamax , verapamil, amitriptyline, Depakote, Inderal, Effexor , Cymbalta, Savannah Taylor, Ubrelvy , Imitrex injections, Maxalt, Neurontin  and Botox.   Exp 11/2024 Lot [id number]  01/07/2020 ALL:  Savannah Taylor returns today for migraine follow up. Savannah Taylor was not taking Amovig consistently at last visit. Savannah Taylor was reeducated on dosing and advised to take 1 injection every month. Savannah Taylor took injections Oct, Nov and Dec. Savannah Taylor does feel that Amovig works to help reduce frequency and intensity of migraines for about 2 weeks. Week 3 and 4 Savannah Taylor has more headaches. We also started Savannah Taylor for abortive therapy. Savannah Taylor reports that it works well to decrease intensity but does not completely relieve headache. Savannah Taylor called mid December reporting an intractable migraine. Depacon infusion provided some benefit but headache returned shortly after infusion. We had her take Savannah Taylor daily for three days and started steroid taper. Savannah Taylor reports that Savannah Taylor continues to have headache. Pain is across the front of her head (more so on left). It is described as a dull swollen/tight feeling.   We have discussed concerns of sleep apnea. Savannah Taylor does not sleep well. Savannah Taylor wakes up multiple times a night. Savannah Taylor often wakes with headaches. Savannah Taylor does snore. Savannah Taylor never feels rested when Savannah Taylor wakes. Savannah Taylor has been hesitant to consider workup due to financial concerns.   We received a referral from PCP for chronic muscle cramps. Cramps are generalized and effect upper and lower extremities. PCP performed lab eval that did not reveal any significant etiology for symptoms. Savannah Taylor has had tingling/pins needles sensation for over a year. Sensation comes and goes. Savannah Taylor feels both legs are numb from tips of her toes to the knee on  both sides but worse on the left. Savannah Taylor tried gabapentin  but was unable to tolerate due to brain fog. Savannah Taylor takes 600mg  1-2 times weekly as needed. Savannah Taylor reports that Savannah Taylor still is not recovered from lumbar fusion surgery in 01/2019. Robaxin  prescribed by neurosurgery. Savannah Taylor was referred to Dr Letha for pain management. Savannah Taylor reports having a normal NCS/EMG many years ago. Savannah Taylor reports that Savannah Taylor cant hold her neck in certain positions or her neck with lock up. Savannah Taylor feels that her hands lock up if Savannah Taylor tries to hold anything for any period of time. Savannah Taylor has spasms of her legs. No incontinence.   Savannah Taylor is taking oxycodone  as needed for back pain. Savannah Taylor denies daily use. Savannah Taylor has filled 30 tablets on 11/17/2019 and 12/14/2019. Savannah Taylor also has alprazolam and clonazepam  on her medication list. Savannah Taylor is not certain how often Savannah Taylor takes either of these. Per PDMP, Savannah Taylor alternates filling alprazolam and clonazepam . Savannah Taylor reports that Savannah Taylor has ongoing depression and anxiety. Savannah Taylor was taking Wellbutrin  at last visit but reports that Savannah Taylor has stopped this medicaiton sometime since last being seen. Savannah Taylor is not sure when but didn't like how Savannah Taylor felt on it.    History (copied from previous notes)  Savannah Taylor is a 68 y.o. female here today  for follow up migraines. Savannah Taylor continues to have regular headaches. Savannah Taylor has not reached out to us  to report headaches have continues. Savannah Taylor has some sort of headache every day. Savannah Taylor has at least 16-20 migraines every month. Savannah Taylor reports taking Amovig every 3 months. Reports that Savannah Taylor has only gotten 1 injection every three months. Amovig helps significantly after injection. Savannah Taylor does not use abortive medication. Savannah Taylor did not feel Ubrelvy  helped. Savannah Taylor did not continue venlafaxine . Savannah Taylor recently lost her mother. Savannah Taylor continues to grieve her loss. Savannah Taylor cries frequently. PCP has been treating depression with Wellbutrin  but Savannah Taylor hs not taken consistently. Savannah Taylor is not sleeping well. Savannah Taylor is always tired. Savannah Taylor wakes frequently at night with  headaches or to use the bathroom. Savannah Taylor does snore. We have discussed concerns of sleep apnea in the past but Savannah Taylor has been reluctant to consider workup. Her son has sleep apnea.    HISTORY: (copied from my note on 07/30/2018)  Savannah Taylor is a 68 y.o. female here today for follow up for migraines and concerns of sleep apnea. Savannah Taylor reports that headaches are unchanged. Savannah Taylor continues to have frequent migraines but feels they are not as intense. Savannah Taylor is caring for her mother with end stage dementia and cancer. Savannah Taylor is taking venlafaxine  75mg  sporadically when Savannah Taylor feels Savannah Taylor needs it. Savannah Taylor has taken Ubrelvy  once but is insure if it helped.    Savannah Taylor has not followed through with sleep apnea evaluation. Savannah Taylor was uncertain if insurance would cover it. Savannah Taylor is interested in continuing with evaluation.    HISTORY: (copied from my note on 03/27/2018)   Savannah Taylor is a 68 y.o. female for follow up. Savannah Taylor is taking topiramate  100mg  daily and Amovig 140mg  monthly for management of migraines.  Savannah Taylor reports that her headaches continue to occur daily.  Savannah Taylor has mild pain with most headaches but does report at least 10 migraines per month.  Migraines are typically unilateral and associated with nausea, light and sound sensitivity.  Last month Savannah Taylor took an extra dose of Aimovig  and reports that that had helped some.  Savannah Taylor states that the first 2 to 3 weeks after taking Aimovig  Savannah Taylor feels that Savannah Taylor is well managed, however, the next week or so her migraines are pretty bad.  Savannah Taylor is taking Zomig  for abortive therapy but reports it is no longer helping.  Savannah Taylor has been on multiple preventative medications including Topamax , verapamil, amitriptyline, Depakote, Inderal, Imitrex injections, Maxalt, Neurontin  and Botox.  Savannah Taylor is also tried Effexor  and Cymbalta.  Headaches have not changed in nature.  Last imaging reviewed was a CT of her head in 2014 that was unremarkable.   Savannah Taylor reports about 10 years ago Savannah Taylor had a work-up for sleep apnea.   Savannah Taylor is unsure of the results of her test but does mention that Savannah Taylor was told if Savannah Taylor ever reaches a deep stage of sleep.  Savannah Taylor does endorse snoring, frequent waking, nocturia, dry mouth, occasional morning headaches, daytime sleepiness and fatigue.  Savannah Taylor is interested in an updated sleep study.     History (copied from [employer] note dated 06/18/17)    Savannah Taylor is a 68 year old female with a history of migraine.  Savannah Taylor states that Savannah Taylor had a severe headache for the last 2 to 3 weeks.  Savannah Taylor is on last day of her prednisone  Dosepak.  Reports prednisone  has been beneficial.  Reports that Savannah Taylor still has a mild headache but not as severe as it was before.  Savannah Taylor  states that her headaches typically occur on the left side.  Savannah Taylor always has phonophobia with her headaches.  Savannah Taylor remains on Topamax .  Savannah Taylor also uses Zomig  as needed.  Savannah Taylor reports that Aimovig  worked well for her migraines in the first 2 months.  Savannah Taylor states by the third month Savannah Taylor began having a constant dull headache.  Savannah Taylor is currently taking the 70 mg injection.  Savannah Taylor returns today for evaluation.   HISTORY:Savannah Taylor is a 68 y.o. female here as a referral from Dr. Douglass for migraines. Past medical history of chronic migraines, depression, hypertension, high cholesterol, B12 deficiency, chronic insomnia, chronic kidney disease, multilevel degenerative disease. Savannah Taylor has been seeing Dr. Oneita for nerve injections. Migraines since a teenager without any inciting event or head trauma. Savannah Taylor has a family history of migraines, Her son and paternal aunt have migraines. Savannah Taylor has been having injections of the [facility], Savannah Taylor went every month and then every 2 months (likely nerve block) but Savannah Taylor also says that Savannah Taylor received Botox which helped but insurance would not pay anymore. Now Savannah Taylor just goes as needed. For nerve blocks. Savannah Taylor has headaches 15 days a month. 8-10 are migrainous. This is been ongoing for the last 6 months. Her migraines feele like her  head is swelling on one side, but they can move around, one eye droops, feels tight, Savannah Taylor can hear her heart beating in her head especially when laying down, more pounding than pressure, Savannah Taylor can't stand sound or noise. Dark rooms help. Being quiet helps. They can last for days straight. They can be severe with nausea and vomiting on average out of 10 can be 6-7/10. Savannah Taylor has been to so many doctors and so many places and the doctors get frustrated because they can't help. Savannah Taylor has been to headaches clinics. Savannah Taylor was told Savannah Taylor was faking in the past. Savannah Taylor has tried everything per patient. No other associated symptoms or focal neurologic deficits or complaints. No aura. No medication overuse.    Tried: Topamax , verapamil, amitriptyline, depakote, inderal, Savannah Taylor has had imitrex injections, maxalt, zomig . Neurontin , botox helped a lot but then her physician said insurance wouldn;t pay, effexor , cymbalta.    Reviewed notes, labs and imaging from outside physicians, which showed:   BUN 12 and creatinine 1.15 taken 04/05/2015. TSH 0.810 January 2017.   CT of the head 04/2012 showed No acute intracranial abnormalities including mass lesion or mass effect, hydrocephalus, extra-axial fluid collection, midline shift, hemorrhage, or acute infarction, large ischemic events (personally reviewed images)    REVIEW OF SYSTEMS: Out of a complete 14 system review of symptoms, the patient complains only of the following symptoms, headaches, depression, insomnia, snoring, muscle cramps, chronic pain and all other reviewed systems are negative.   ALLERGIES: Allergies  Allergen Reactions   Bystolic  [Nebivolol  Hcl] Palpitations and Other (See Comments)   Atorvastatin Other (See Comments)    Cramping in hands and feet on 10mg  daily   Meperidine Other (See Comments)   Morphine  And Codeine Itching   Naproxen Hives and Itching   Pravastatin      Muscle aches on 20mg  daily   Sertraline Hcl Other (See Comments)    hallucinations    Topiramate  Other (See Comments)    Apathetic out of it   Venlafaxine  Hcl Er Other (See Comments)    Hair loss   Zetia  [Ezetimibe ]     Cramping on 10mg  daily   Rosuvastatin  Other (See Comments)    Cramping in hands and feet and  leg muscles on 10mg  daily    HOME MEDICATIONS: Outpatient Medications Prior to Visit  Medication Sig Dispense Refill   amLODipine  (NORVASC ) 5 MG tablet Take 5 mg by mouth in the morning and at bedtime.     aspirin  EC 81 MG tablet Take 1 tablet (81 mg total) by mouth daily. Swallow whole. 90 tablet 3   cyclobenzaprine (FLEXERIL) 5 MG tablet Take 5 mg by mouth 3 (three) times daily as needed.     Eptinezumab -jjmr (VYEPTI ) 100 MG/ML injection      Evolocumab  (REPATHA  SURECLICK) 140 MG/ML SOAJ Inject 140 mg into the skin every 14 (fourteen) days. 6 mL 3   metoCLOPramide  (REGLAN ) 10 MG tablet Take 10 mg by mouth every 8 (eight) hours as needed.     omeprazole (PRILOSEC) 40 MG capsule Take 40 mg by mouth daily.     ondansetron  (ZOFRAN ) 4 MG tablet Take 4 mg by mouth 2 (two) times daily as needed.     OZEMPIC, 0.25 OR 0.5 MG/DOSE, 2 MG/3ML SOPN INJECT 0.25MG  WEEKLY FOR 4 WEEKS, THEN INCREASE TO 0.5MG  WEEKLY THEREAFTER.     polyethylene glycol powder (MIRALAX) 17 GM/SCOOP powder as needed.     Rimegepant Sulfate (Savannah Taylor) 75 MG TBDP Take 1 tablet (75 mg total) by mouth daily as needed (take for abortive therapy of migraine, no more than 1 tablet in 24 hours or 10 per month). 8 tablet 11   rosuvastatin  (CRESTOR ) 5 MG tablet TAKE 1 TABLET BY MOUTH EVERY OTHER DAY 45 tablet 0   No facility-administered medications prior to visit.    PAST MEDICAL HISTORY: Past Medical History:  Diagnosis Date   Abdominal cramps    Asthma    Back problem    BAD DISK IN BACK   DDD (degenerative disc disease), lumbosacral    Depression    Gastritis    GERD (gastroesophageal reflux disease)    H/O hiatal hernia    Heart murmur    Years ago   Hyperlipidemia    Hypertension     Insomnia    Knee problem    Leg cramps    Migraine    01/22/2019- last one > 1 month ago   Post concussion syndrome    Seizure (HCC)    1 many years ago with a migraine   Sleep apnea    does not use CPAP   Stroke (HCC)    TIA  slight memory  issues TIA prior to 2010   Ulcer    STOMACH & SORENESS & PAIN   UTI (urinary tract infection) 07/02/2012    PAST SURGICAL HISTORY: Past Surgical History:  Procedure Laterality Date   ABDOMINAL HYSTERECTOMY     ARTHROGRAM KNEE Right    MCL tear   BACK SURGERY     PLIF L4-5, 5-S1 Dr. joshua 01-2019   BREAST REDUCTION SURGERY Bilateral 05/10/2021   Procedure: MAMMARY REDUCTION  (BREAST);  Surgeon: Lowery Estefana RAMAN, DO;  Location: [facility];  Service: Plastics;  Laterality: Bilateral;   COLONOSCOPY     CYST EXCISION Right 03/26/2022   Procedure: RIGHT SMALL FINGER EXCISION MUCOID CYST AND DEBRIDEMENT DISTAL INTERPHALANGEAL JOINT;  Surgeon: Murrell Drivers, MD;  Location: [facility];  Service: Orthopedics;  Laterality: Right;  30 MIN   ENDOSCOPY  2020   hiatal hernia, gastritis, and some bacteria in stomach   HEMORRHOID SURGERY     ROTATION FLAP Right 03/26/2022   Procedure: ROTATION FLAP;  Surgeon: Murrell Drivers, MD;  Location: [facility];  Service: Orthopedics;  Laterality: Right;    FAMILY HISTORY: Family History  Problem Relation Age of Onset   Hypertension Mother    Hyperlipidemia Mother    Asthma Father    Cancer Father    Hypertension Brother    Sleep apnea Son    Breast cancer Neg Hx     SOCIAL HISTORY: Social History   Socioeconomic History   Marital status: Married    Spouse name: Eddie   Number of children: 3   Years of education: Some college   Highest education level: Not on file  Occupational History   Occupation: works full time  Tobacco Use   Smoking status: Never   Smokeless tobacco: Never  Vaping Use   Vaping status: Never Used  Substance and Sexual Activity    Alcohol  use: No   Drug use: No   Sexual activity: Not Currently    Birth control/protection: Surgical  Other Topics Concern   Not on file  Social History Narrative   Lives with husband   Caffeine use: Coffee/tea/soda sometimes per patient   Social Drivers of Corporate investment banker Strain: Not on file  Food Insecurity: Low Risk  (03/28/2023)   Received from [facility]   Hunger Vital Sign    Within the past 12 months, you worried that your food would run out before you got money to buy more: Never true    Within the past 12 months, the food you bought just didn't last and you didn't have money to get more. : Never true  Transportation Needs: No Transportation Needs (03/28/2023)   Received from [facility]    In the past 12 months, has lack of reliable transportation kept you from medical appointments, meetings, work or from getting things needed for daily living? : No  Physical Activity: Not on file  Stress: Not on file  Social Connections: Not on file  Intimate Partner Violence: Not on file      PHYSICAL EXAM  There were no vitals filed for this visit.      There is no height or weight on file to calculate BMI.  Generalized: Well developed, in no acute distress  Cardiology: normal rate and rhythm, no murmur noted Respiratory: clear to auscultation bilaterally  Neurological examination  Mentation: Alert oriented to time, place, history taking. Follows all commands speech and language fluent Cranial nerve II-XII: Pupils were equal round reactive to light. Extraocular movements were full, visual field were full  Motor: The motor testing reveals 5 over 5 strength of all 4 extremities. Good symmetric motor tone is noted throughout.  Gait and station: Gait is normal.      DIAGNOSTIC DATA (LABS, IMAGING, TESTING) - I reviewed patient records, labs, notes, testing and imaging myself where available.      No data to display           Lab  Results  Component Value Date   WBC 6.9 07/07/2020   HGB 13.5 07/07/2020   HCT 40.6 07/07/2020   MCV 89.8 07/07/2020   PLT 333 07/07/2020      Component Value Date/Time   NA 144 09/18/2022 1533   K 4.1 09/18/2022 1533   CL 103 09/18/2022 1533   CO2 24 09/18/2022 1533   GLUCOSE 80 09/18/2022 1533   GLUCOSE 100 (H) 07/07/2020 1750   BUN 14 09/18/2022 1533   CREATININE 1.12 (H) 09/18/2022 1533   CALCIUM  10.0 09/18/2022 1533  PROT 6.9 09/18/2022 1533   ALBUMIN  4.4 09/18/2022 1533   AST 17 09/18/2022 1533   ALT 12 09/18/2022 1533   ALKPHOS 103 09/18/2022 1533   BILITOT 0.2 09/18/2022 1533   GFRNONAA >60 07/07/2020 1750   GFRAA >60 01/22/2019 1030   Lab Results  Component Value Date   CHOL 145 09/18/2022   HDL 60 09/18/2022   LDLCALC 63 09/18/2022   TRIG 128 09/18/2022   CHOLHDL 2.4 09/18/2022   No results found for: HGBA1C Lab Results  Component Value Date   VITAMINB12 >2000 (H) 11/28/2021   No results found for: TSH   ASSESSMENT AND PLAN 68 y.o. year old female  has a past medical history of Abdominal cramps, Asthma, Back problem, DDD (degenerative disc disease), lumbosacral, Depression, Gastritis, GERD (gastroesophageal reflux disease), H/O hiatal hernia, Heart murmur, Hyperlipidemia, Hypertension, Insomnia, Knee problem, Leg cramps, Migraine, Post concussion syndrome, Seizure (HCC), Sleep apnea, Stroke (HCC), Ulcer, and UTI (urinary tract infection) (07/02/2012). here with   No diagnosis found.   Mrs Beaston reports headaches continue. Savannah Taylor did not hear back from our office to start Vyepti  infusions. I have discussed options for migraine prevention and Savannah Taylor is most comfortable  with starting Vyepti  infusions every 12 weeks. He is aware to call me if Savannah Taylor does not hear back from our office to schedule infusion by 09/04/2022. Savannah Taylor will continue Savannah Taylor for migraine abortion. Savannah Taylor may use Tylenol  and Benadryl . Avoid Nsaids d/t stomach ulcer. Triptan not effective. May  consider nasal CGRP in future if needed. BP has been stable. HST normal. Savannah Taylor will see Dr Savannah Taylor to review neurocognitive results as directed. Savannah Taylor will focus on healthy lifestyle habits as discussed.  We will plan to follow-up in 4-6 months. Savannah Taylor verbalizes understanding and agreement with this plan.   No orders of the defined types were placed in this encounter.    No orders of the defined types were placed in this encounter.     Greig Forbes, FNP-C 09/25/2023, 8:54 AM [facility] [address] [place], [place] [id number] [phone]

## 2023-09-26 DIAGNOSIS — H35341 Macular cyst, hole, or pseudohole, right eye: Secondary | ICD-10-CM | POA: Diagnosis not present

## 2023-09-30 ENCOUNTER — Encounter: Payer: Self-pay | Admitting: Family Medicine

## 2023-09-30 ENCOUNTER — Ambulatory Visit: Admitting: Family Medicine

## 2023-09-30 DIAGNOSIS — G43719 Chronic migraine without aura, intractable, without status migrainosus: Secondary | ICD-10-CM

## 2023-10-01 DIAGNOSIS — E6609 Other obesity due to excess calories: Secondary | ICD-10-CM | POA: Diagnosis not present

## 2023-10-01 DIAGNOSIS — E669 Obesity, unspecified: Secondary | ICD-10-CM | POA: Diagnosis not present

## 2023-10-01 DIAGNOSIS — N1831 Chronic kidney disease, stage 3a: Secondary | ICD-10-CM | POA: Diagnosis not present

## 2023-10-01 DIAGNOSIS — I10 Essential (primary) hypertension: Secondary | ICD-10-CM | POA: Diagnosis not present

## 2023-10-03 DIAGNOSIS — H35341 Macular cyst, hole, or pseudohole, right eye: Secondary | ICD-10-CM | POA: Diagnosis not present

## 2023-10-03 DIAGNOSIS — Z9889 Other specified postprocedural states: Secondary | ICD-10-CM | POA: Diagnosis not present

## 2023-10-03 DIAGNOSIS — H35363 Drusen (degenerative) of macula, bilateral: Secondary | ICD-10-CM | POA: Diagnosis not present

## 2023-10-14 ENCOUNTER — Other Ambulatory Visit: Payer: Self-pay | Admitting: Cardiovascular Disease

## 2023-10-15 ENCOUNTER — Ambulatory Visit: Admitting: Dietician

## 2023-10-15 DIAGNOSIS — E1169 Type 2 diabetes mellitus with other specified complication: Secondary | ICD-10-CM | POA: Diagnosis not present

## 2023-10-15 DIAGNOSIS — Z008 Encounter for other general examination: Secondary | ICD-10-CM | POA: Diagnosis not present

## 2023-10-21 ENCOUNTER — Encounter: Payer: Self-pay | Admitting: Dietician

## 2023-10-21 ENCOUNTER — Encounter: Attending: Family Medicine | Admitting: Dietician

## 2023-10-21 VITALS — Ht 65.0 in | Wt 186.0 lb

## 2023-10-21 DIAGNOSIS — E119 Type 2 diabetes mellitus without complications: Secondary | ICD-10-CM | POA: Diagnosis present

## 2023-10-21 NOTE — Progress Notes (Unsigned)
 Diabetes Self-Management Education  Visit Type: First/Initial  Appt. Start Time: 1130 Appt. End Time: 1240  10/22/2023  [name], identified by name and date of birth, is a 68 y.o. female with a diagnosis of Diabetes: Type 2.   ASSESSMENT Patient is here today alone.  Referral:  Type 2 Diabetes with CKD  History includes:  Type 2 Diabetes (2025), GERD, OSA - no longer on C-pap after she lost weight, HLD, HTN, CVA, seizures (not recently), migraines (helped with the infusion), CKD 3a Medications include:  Ozempic (0.5), Reglan  for Migraine  Labs noted to include:  A1C 6.6% per patient 08/2023, eGFR 54 (09/18/2022, Vitamin B-12 >2000 on 11/28/2021 Lipid Panel     Component Value Date/Time   CHOL 145 09/18/2022 1533   TRIG 128 09/18/2022 1533   HDL 60 09/18/2022 1533   CHOLHDL 2.4 09/18/2022 1533   LDLCALC 63 09/18/2022 1533   LABVLDL 22 09/18/2022 1533    65 186 lbs 10/21/2023 - lost weight on Ozempic 201 lbs 08/12/2023 - lost weight with lifestyle change 218 lbs 2023 175 lbs patient goal weight  Patient lives with her husband.  She does the shopping and cooking.  Her husband also has diabetes. 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 Was going to the [facility] but migraines started and stopped this habit.  Migraines now improved.  Height 5' 5 (1.651 m), weight 186 lb (84.4 kg). Body mass index is 30.95 kg/m.   Diabetes Self-Management Education - 10/21/23 1158       Visit Information   Visit Type First/Initial      Initial Visit   Diabetes Type Type 2    Date Diagnosed 08/2023    Are you currently following a meal plan? No    Are you taking your medications as prescribed? Yes      Health Coping   How would you rate your overall health? Good      Psychosocial Assessment   Patient Belief/Attitude about Diabetes Motivated to manage diabetes    What is the hardest part about your diabetes right now, causing you  the most concern, or is the most worrisome to you about your diabetes?   Making healty food and beverage choices    Self-care barriers None    Self-management support Doctor's office    Other persons present Patient    Patient Concerns Nutrition/Meal planning;Healthy Lifestyle;Weight Control    Special Needs None    Preferred Learning Style No preference indicated    Learning Readiness Ready    How often do you need to have someone help you when you read instructions, pamphlets, or other written materials from your doctor or pharmacy? 1 - Never    What is the last grade level you completed in school? some college      Pre-Education Assessment   Patient understands the diabetes disease and treatment process. Needs Instruction    Patient understands incorporating nutritional management into lifestyle. Needs Instruction    Patient undertands incorporating physical activity into lifestyle. Needs Instruction    Patient understands using medications safely. Needs Instruction    Patient understands monitoring blood glucose, interpreting and using results Needs Instruction    Patient understands prevention, detection, and treatment of acute complications. Needs Instruction    Patient understands prevention, detection, and treatment of chronic complications. Needs Instruction    Patient understands how to develop strategies to address psychosocial issues. Needs Instruction    Patient  understands how to develop strategies to promote health/change behavior. Needs Instruction      Complications   Last HgB A1C per patient/outside source 6.6 %   08/2023   How often do you check your blood sugar? 0 times/day (not testing)    Have you had a dilated eye exam in the past 12 months? Yes    Have you had a dental exam in the past 12 months? Yes    Are you checking your feet? Yes    How many days per week are you checking your feet? 4      Dietary Intake   Breakfast coffee with stevia sweetened creamer, 1  slice white toast with honey butter.    Snack (morning) none    Lunch skips    Snack (afternoon) seldom    Dinner baked chicken, turnip greens, potatoes    Snack (evening) skittles    Beverage(s) water, coffee with sweetened creamer and stevia, occasional regular soda (twice per week), rare juice or tea      Activity / Exercise   Activity / Exercise Type ADL's      Patient Education   Previous Diabetes Education No    Disease Pathophysiology Definition of diabetes, type 1 and 2, and the diagnosis of diabetes;Explored patient's options for treatment of their diabetes    Healthy Eating Role of diet in the treatment of diabetes and the relationship between the three main macronutrients and blood glucose level;Food label reading, portion sizes and measuring food.;Plate Method;Meal options for control of blood glucose level and chronic complications.;Information on hints to eating out and maintain blood glucose control.    Being Active Role of exercise on diabetes management, blood pressure control and cardiac health.;Helped patient identify appropriate exercises in relation to his/her diabetes, diabetes complications and other health issue.    Medications Reviewed patients medication for diabetes, action, purpose, timing of dose and side effects.    Monitoring Identified appropriate SMBG and/or A1C goals.;Taught/discussed recording of test results and interpretation of SMBG.    Acute complications Taught prevention, symptoms, and  treatment of hypoglycemia - the 15 rule.;Discussed and identified patients' prevention, symptoms, and treatment of hyperglycemia.    Chronic complications Relationship between chronic complications and blood glucose control    Diabetes Stress and Support Identified and addressed patients feelings and concerns about diabetes;Worked with patient to identify barriers to care and solutions;Role of stress on diabetes      Individualized Goals (developed by patient)    Nutrition General guidelines for healthy choices and portions discussed    Physical Activity Exercise 3-5 times per week;30 minutes per day    Medications take my medication as prescribed    Monitoring  Test my blood glucose as discussed    Problem Solving Eating Pattern    Reducing Risk examine blood glucose patterns;do foot checks daily;treat hypoglycemia with 15 grams of carbs if blood glucose less than 70mg /dL      Post-Education Assessment   Patient understands the diabetes disease and treatment process. Comprehends key points    Patient understands incorporating nutritional management into lifestyle. Comprehends key points    Patient undertands incorporating physical activity into lifestyle. Comprehends key points    Patient understands using medications safely. Comphrehends key points    Patient understands monitoring blood glucose, interpreting and using results Comprehends key points    Patient understands prevention, detection, and treatment of acute complications. Comprehends key points    Patient understands prevention, detection, and treatment of chronic complications. Comprehends  key points    Patient understands how to develop strategies to address psychosocial issues. Comprehends key points    Patient understands how to develop strategies to promote health/change behavior. Comprehends key points      Outcomes   Expected Outcomes Demonstrated interest in learning. Expect positive outcomes    Future DMSE 2 months;PRN    Program Status Completed          Individualized Plan for Diabetes Self-Management Training:   Learning Objective:  Patient will have a greater understanding of diabetes self-management. Patient education plan is to attend individual and/or group sessions per assessed needs and concerns.   Plan:   Patient Instructions  Be active for 30 minutes most every day.  Resistant exercise 2 times per week.  Eat more Non-Starchy Vegetables These include  greens, broccoli, cauliflower, cabbage, carrots, beets, eggplant, peppers, squash and others. Minimize added sugars and refined grains Rethink what you drink.  Choose beverages without added sugar.  Look for 0 carbs on the label. See the list of whole grains below.  Find alternatives to usual sweet treats. Choose whole foods over processed. Make simple meals at home more often than eating out.  Tips to increase fiber in your diet: (All plants have fiber.  Eat a variety. There are more than are on this list.) Slowly increase the amount of fiber you eat to 25-35 grams per day.  (More is fine if you tolerate it.) Fiber from whole grains, nuts and seeds Quinoa, 1/2 cup = 5 grams Bulgur, 1/2 cup = 4.1 grams Popcorn, 3 cups = 3.6 grams Whole Wheat Spaghetti, 1/2 cup = 3.2 grams Barley, 1/2 cup = 3 grams Oatmeal, 1/2 cup = 2 grams Whole Wheat English Muffin = 3 grams Corn, 1/2 cup = 2.1 grams Brown Rice, 1/2 cup = 1.8 grams Flax seeds, 1 Tablespoon = 2.8 grams Chia seeds, 1 Tablespoon = 11 grams Almonds, 1 ounce = 3.5 grams fiber Fiber from legumes Kidney beans, 1/2 cup 7.9 grams Lentils, 1/2 cup = 7.8 grams Pinto beans, 1/2 cup = 7.7 grams Black beans, 1/2 cup = 7.6 grams Lima beans, 1/2 cup 6.4 grams Chick peas, 1/2 cup = 5.3 grams Black eyed peas, 1/2 cup = 4 grams Fiber from fruits and vegetables Pear, 6 grams Apple. 3.3 grams Raspberries or Blackberries, 3/4 cup = 6 grams Strawberries or Blueberries, 1 cup = 3.4 grams Baked sweet potato 3.8 grams fiber Baked potato with skin 4.4 grams  Peas, 1/2 cup = 4.4 grams  Spinach, 1/2 cup cooked = 3.5 grams  Avocado, 1/2 = 5 grams  Expected Outcomes:  Demonstrated interest in learning. Expect positive outcomes  Education material provided: ADA - How to Thrive: A Guide for Your Journey with Diabetes, Food label handouts, Meal plan card, Snack sheet, and Diabetes Resources, ACL[facility] [employer] of Lifestyle Medicine) packet  If  problems or questions, patient to contact team via:  Phone  Future DSME appointment: 2 months, PRN

## 2023-10-21 NOTE — Patient Instructions (Signed)
 Be active for 30 minutes most every day.  Resistant exercise 2 times per week.  Eat more Non-Starchy Vegetables These include greens, broccoli, cauliflower, cabbage, carrots, beets, eggplant, peppers, squash and others. Minimize added sugars and refined grains Rethink what you drink.  Choose beverages without added sugar.  Look for 0 carbs on the label. See the list of whole grains below.  Find alternatives to usual sweet treats. Choose whole foods over processed. Make simple meals at home more often than eating out.  Tips to increase fiber in your diet: (All plants have fiber.  Eat a variety. There are more than are on this list.) Slowly increase the amount of fiber you eat to 25-35 grams per day.  (More is fine if you tolerate it.) Fiber from whole grains, nuts and seeds Quinoa, 1/2 cup = 5 grams Bulgur, 1/2 cup = 4.1 grams Popcorn, 3 cups = 3.6 grams Whole Wheat Spaghetti, 1/2 cup = 3.2 grams Barley, 1/2 cup = 3 grams Oatmeal, 1/2 cup = 2 grams Whole Wheat English Muffin = 3 grams Corn, 1/2 cup = 2.1 grams Brown Rice, 1/2 cup = 1.8 grams Flax seeds, 1 Tablespoon = 2.8 grams Chia seeds, 1 Tablespoon = 11 grams Almonds, 1 ounce = 3.5 grams fiber Fiber from legumes Kidney beans, 1/2 cup 7.9 grams Lentils, 1/2 cup = 7.8 grams Pinto beans, 1/2 cup = 7.7 grams Black beans, 1/2 cup = 7.6 grams Lima beans, 1/2 cup 6.4 grams Chick peas, 1/2 cup = 5.3 grams Black eyed peas, 1/2 cup = 4 grams Fiber from fruits and vegetables Pear, 6 grams Apple. 3.3 grams Raspberries or Blackberries, 3/4 cup = 6 grams Strawberries or Blueberries, 1 cup = 3.4 grams Baked sweet potato 3.8 grams fiber Baked potato with skin 4.4 grams  Peas, 1/2 cup = 4.4 grams  Spinach, 1/2 cup cooked = 3.5 grams  Avocado, 1/2 = 5 grams

## 2023-10-22 DIAGNOSIS — E1122 Type 2 diabetes mellitus with diabetic chronic kidney disease: Secondary | ICD-10-CM | POA: Diagnosis not present

## 2023-10-23 DIAGNOSIS — G43701 Chronic migraine without aura, not intractable, with status migrainosus: Secondary | ICD-10-CM | POA: Diagnosis not present

## 2023-11-27 ENCOUNTER — Other Ambulatory Visit: Payer: Self-pay | Admitting: Family Medicine

## 2023-11-27 DIAGNOSIS — Z1231 Encounter for screening mammogram for malignant neoplasm of breast: Secondary | ICD-10-CM

## 2023-12-05 NOTE — Telephone Encounter (Signed)
 Vyepti  form received back from patient. Provider prescription portion completed and is pending NP signature.   Vyepti  300 mg IV q3 months.

## 2023-12-31 ENCOUNTER — Ambulatory Visit
Admission: RE | Admit: 2023-12-31 | Discharge: 2023-12-31 | Disposition: A | Discharge status: Home / Self Care | Source: Ambulatory Visit | Attending: Family Medicine | Admitting: Family Medicine

## 2023-12-31 DIAGNOSIS — Z1231 Encounter for screening mammogram for malignant neoplasm of breast: Secondary | ICD-10-CM

## 2024-01-06 ENCOUNTER — Other Ambulatory Visit (HOSPITAL_COMMUNITY): Payer: Self-pay

## 2024-01-06 ENCOUNTER — Telehealth: Payer: Self-pay | Admitting: Cardiovascular Disease

## 2024-01-06 ENCOUNTER — Telehealth: Payer: Self-pay | Admitting: Pharmacy Technician

## 2024-01-06 NOTE — Telephone Encounter (Signed)
 Pt c/o medication issue:  1. Name of Medication:   Evolocumab  (REPATHA  SURECLICK) 140 MG/ML SOAJ   2. How are you currently taking this medication (dosage and times per day)?   As prescribed  3. Are you having a reaction (difficulty breathing--STAT)?   4. What is your medication issue?   Patient stated she can no longer afford this medication and wants to get alternate medication.

## 2024-01-06 NOTE — Telephone Encounter (Signed)
 Patient Advocate Encounter   The patient was approved for a [employer] grant that will help cover the cost of REPATHA  Total amount awarded, 2500.  Effective: 12/07/23 - 12/05/24   APW:[id number] ERW:[id number] Hmnle:[id number] PI:[id number] [employer] ID: [id number]   Pharmacy provided with approval and processing information. Patient informed via mychart    I called [facility] and this is now free.

## 2024-01-06 NOTE — Telephone Encounter (Signed)
 Spoke with pt, she was told her portion of the repatha  was going to be $596.49 and she can not afford that. Aware will forward to our pharmacy team to research.

## 2024-01-06 NOTE — Telephone Encounter (Signed)
" ° °  Patient Advocate Encounter   The patient was approved for a [employer] grant that will help cover the cost of REPATHA  Total amount awarded, 2500.  Effective: 12/07/23 - 12/05/24   APW:[id number] ERW:[id number] Hmnle:[id number] PI:[id number] [employer] ID: [id number]   Pharmacy provided with approval and processing information. Patient informed via mychart   I called [facility] and this is now free.  "

## 2024-01-06 NOTE — Telephone Encounter (Signed)
 Pt called to request to speak to CMA about VYEPTI  application . Pt had some Questions she would like to go over with CMA

## 2024-01-08 ENCOUNTER — Telehealth: Payer: Self-pay

## 2024-01-08 NOTE — Telephone Encounter (Signed)
 Called and spoke to pt because I received a call from vyepti  connect stating that [facility] was not in network for [employer], however; pt is getting drug free of charge through [facility] pt assistance do her insurance isn't even being billed. I spoke w/kim [facility] who stated that everything is a go for her upcoming injection and since she is getting free of charge her insurance isn't being billed. I had received the call from vyepti  connect and [facility] called the pt to get everything sorted out so the pt is aware of all of this. Vyepti  connect then transferred the pt and myself to [facility] pt assistance to make sure that thepatient is squared away. They stated that they have to fax the assistance form to our office for the patient to complete here and is unattainable online. Pt is aware that once the form is received I will be in contact with her via phone to come in. Pt voiced gratitude and understanding of all discussed.

## 2024-01-14 NOTE — Telephone Encounter (Signed)
 Pt was called, message from CMA was relayed to pt, she said she will reach back out to them this morning.

## 2024-01-21 NOTE — Telephone Encounter (Signed)
 I called and spoke to pt and stated that the form was faxed to [facility] at [phone]:

## 2024-01-21 NOTE — Telephone Encounter (Signed)
 Pt has requested to speak to nurse regarding her Vyepti  Form.

## 2024-01-23 NOTE — Telephone Encounter (Signed)
 Vyepti  Assistance Program [place]) Requesting copy of patient insurance card front and back. Can contact at: [phone], [phone]

## 2024-01-23 NOTE — Telephone Encounter (Signed)
 Waiting on signature from amy

## 2024-01-29 NOTE — Telephone Encounter (Signed)
 Received fax dated 1/27 from [place] requesting copy of insurance card, front and back. Copy faxed to [facility] w/ Vyepti  @ [phone]. Received a receipt of confirmation.

## 2024-01-29 NOTE — Telephone Encounter (Signed)
 Faxed newly signed vyepti  patient assistance form faxed to [facility] @ [phone]. Received a receipt of confirmation.:

## 2024-02-03 ENCOUNTER — Other Ambulatory Visit (HOSPITAL_COMMUNITY): Payer: Self-pay

## 2024-02-03 ENCOUNTER — Telehealth: Payer: Self-pay | Admitting: Pharmacy Technician

## 2024-02-03 NOTE — Telephone Encounter (Signed)
" ° ° ° °  Patient asked if any assistance for ozempic. Mounjaro is more expensive and neither have any assistance available for medicare patients.  "
